# Patient Record
Sex: Female | Born: 1975 | Race: White | Hispanic: No | Marital: Married | Smoking: Former smoker
Health system: Southern US, Community
[De-identification: ages and names within clinical notes are randomized; demographics above are authoritative.]

## PROBLEM LIST (undated history)

## (undated) DIAGNOSIS — I8390 Asymptomatic varicose veins of unspecified lower extremity: Secondary | ICD-10-CM

## (undated) DIAGNOSIS — M797 Fibromyalgia: Secondary | ICD-10-CM

## (undated) DIAGNOSIS — N644 Mastodynia: Secondary | ICD-10-CM

## (undated) DIAGNOSIS — F319 Bipolar disorder, unspecified: Secondary | ICD-10-CM

## (undated) DIAGNOSIS — N893 Dysplasia of vagina, unspecified: Secondary | ICD-10-CM

## (undated) DIAGNOSIS — C55 Malignant neoplasm of uterus, part unspecified: Secondary | ICD-10-CM

## (undated) DIAGNOSIS — G4730 Sleep apnea, unspecified: Secondary | ICD-10-CM

## (undated) DIAGNOSIS — F329 Major depressive disorder, single episode, unspecified: Secondary | ICD-10-CM

## (undated) DIAGNOSIS — E785 Hyperlipidemia, unspecified: Secondary | ICD-10-CM

## (undated) DIAGNOSIS — Z8709 Personal history of other diseases of the respiratory system: Secondary | ICD-10-CM

## (undated) DIAGNOSIS — F909 Attention-deficit hyperactivity disorder, unspecified type: Secondary | ICD-10-CM

## (undated) DIAGNOSIS — L309 Dermatitis, unspecified: Secondary | ICD-10-CM

## (undated) DIAGNOSIS — R296 Repeated falls: Secondary | ICD-10-CM

## (undated) DIAGNOSIS — F419 Anxiety disorder, unspecified: Secondary | ICD-10-CM

## (undated) DIAGNOSIS — N930 Postcoital and contact bleeding: Secondary | ICD-10-CM

## (undated) DIAGNOSIS — R5383 Other fatigue: Secondary | ICD-10-CM

## (undated) DIAGNOSIS — F32A Depression, unspecified: Secondary | ICD-10-CM

## (undated) DIAGNOSIS — W19XXXA Unspecified fall, initial encounter: Secondary | ICD-10-CM

## (undated) DIAGNOSIS — R51 Headache: Secondary | ICD-10-CM

## (undated) DIAGNOSIS — E669 Obesity, unspecified: Secondary | ICD-10-CM

## (undated) DIAGNOSIS — Z87828 Personal history of other (healed) physical injury and trauma: Secondary | ICD-10-CM

## (undated) DIAGNOSIS — K219 Gastro-esophageal reflux disease without esophagitis: Secondary | ICD-10-CM

## (undated) DIAGNOSIS — R519 Headache, unspecified: Secondary | ICD-10-CM

## (undated) DIAGNOSIS — Z8739 Personal history of other diseases of the musculoskeletal system and connective tissue: Secondary | ICD-10-CM

## (undated) DIAGNOSIS — R7303 Prediabetes: Secondary | ICD-10-CM

## (undated) HISTORY — DX: Headache: R51

## (undated) HISTORY — DX: Obesity, unspecified: E66.9

## (undated) HISTORY — DX: Hyperlipidemia, unspecified: E78.5

## (undated) HISTORY — DX: Headache, unspecified: R51.9

## (undated) HISTORY — DX: Postcoital and contact bleeding: N93.0

## (undated) HISTORY — PX: LEEP: SHX91

## (undated) HISTORY — DX: Depression, unspecified: F32.A

## (undated) HISTORY — DX: Attention-deficit hyperactivity disorder, unspecified type: F90.9

## (undated) HISTORY — DX: Sleep apnea, unspecified: G47.30

## (undated) HISTORY — DX: Anxiety disorder, unspecified: F41.9

## (undated) HISTORY — PX: ABDOMINAL HYSTERECTOMY: SHX81

## (undated) HISTORY — DX: Major depressive disorder, single episode, unspecified: F32.9

## (undated) HISTORY — DX: Fibromyalgia: M79.7

## (undated) HISTORY — DX: Bipolar disorder, unspecified: F31.9

## (undated) HISTORY — PX: TONSILLECTOMY: SUR1361

## (undated) HISTORY — DX: Dermatitis, unspecified: L30.9

## (undated) HISTORY — DX: Other fatigue: R53.83

## (undated) HISTORY — DX: Mastodynia: N64.4

## (undated) HISTORY — PX: CARPAL TUNNEL RELEASE: SHX101

## (undated) HISTORY — PX: VARICOSE VEIN SURGERY: SHX832

---

## 2004-05-01 ENCOUNTER — Emergency Department: Payer: Self-pay | Admitting: Emergency Medicine

## 2004-07-04 ENCOUNTER — Emergency Department: Payer: Self-pay | Admitting: General Practice

## 2004-08-19 ENCOUNTER — Emergency Department: Payer: Self-pay | Admitting: Internal Medicine

## 2004-09-16 ENCOUNTER — Emergency Department: Payer: Self-pay | Admitting: General Practice

## 2004-12-27 ENCOUNTER — Ambulatory Visit: Payer: Self-pay | Admitting: Family Medicine

## 2005-01-12 ENCOUNTER — Emergency Department: Payer: Self-pay | Admitting: Emergency Medicine

## 2005-09-13 ENCOUNTER — Emergency Department: Payer: Self-pay | Admitting: Emergency Medicine

## 2006-01-02 ENCOUNTER — Emergency Department: Payer: Self-pay | Admitting: Emergency Medicine

## 2011-10-28 ENCOUNTER — Emergency Department: Payer: Self-pay | Admitting: Emergency Medicine

## 2013-05-24 ENCOUNTER — Ambulatory Visit: Payer: Self-pay | Admitting: Family Medicine

## 2013-07-23 ENCOUNTER — Ambulatory Visit: Payer: Self-pay | Admitting: Gynecologic Oncology

## 2013-08-04 ENCOUNTER — Ambulatory Visit: Payer: Self-pay | Admitting: Gynecologic Oncology

## 2013-08-14 ENCOUNTER — Ambulatory Visit: Payer: Self-pay | Admitting: Gynecologic Oncology

## 2013-08-14 LAB — BASIC METABOLIC PANEL
Anion Gap: 3 — ABNORMAL LOW (ref 7–16)
BUN: 10 mg/dL (ref 7–18)
Calcium, Total: 9.4 mg/dL (ref 8.5–10.1)
Chloride: 106 mmol/L (ref 98–107)
Co2: 27 mmol/L (ref 21–32)
Creatinine: 0.74 mg/dL (ref 0.60–1.30)
EGFR (African American): >60
EGFR (Non-African Amer.): >60
Glucose: 95 mg/dL (ref 65–99)
Osmolality: 271 (ref 275–301)
Potassium: 4.2 mmol/L (ref 3.5–5.1)
Sodium: 136 mmol/L (ref 136–145)

## 2013-08-14 LAB — CBC
HCT: 43.1 % (ref 35.0–47.0)
HGB: 14.7 g/dL (ref 12.0–16.0)
MCH: 31.6 pg (ref 26.0–34.0)
MCHC: 34 g/dL (ref 32.0–36.0)
MCV: 93 fL (ref 80–100)
PLATELETS: 230 10*3/uL (ref 150–440)
RBC: 4.64 10*6/uL (ref 3.80–5.20)
RDW: 14.4 % (ref 11.5–14.5)
WBC: 8.4 10*3/uL (ref 3.6–11.0)

## 2013-09-03 ENCOUNTER — Ambulatory Visit: Payer: Self-pay | Admitting: Gynecologic Oncology

## 2013-09-10 ENCOUNTER — Inpatient Hospital Stay: Payer: Self-pay | Admitting: Obstetrics and Gynecology

## 2013-09-11 LAB — HEMOGLOBIN: HGB: 12.5 g/dL (ref 12.0–16.0)

## 2013-09-12 LAB — PATHOLOGY REPORT

## 2013-10-02 ENCOUNTER — Ambulatory Visit: Payer: Self-pay | Admitting: Gynecologic Oncology

## 2013-11-01 ENCOUNTER — Ambulatory Visit: Payer: Self-pay | Admitting: Gynecologic Oncology

## 2014-01-24 ENCOUNTER — Ambulatory Visit: Payer: Self-pay

## 2014-02-14 ENCOUNTER — Ambulatory Visit: Payer: Self-pay | Admitting: Family Medicine

## 2014-03-05 ENCOUNTER — Ambulatory Visit: Payer: Self-pay | Admitting: Family Medicine

## 2014-03-07 ENCOUNTER — Emergency Department: Payer: Self-pay | Admitting: Emergency Medicine

## 2014-03-07 ENCOUNTER — Ambulatory Visit: Payer: Self-pay | Admitting: Family Medicine

## 2014-03-07 LAB — CBC WITH DIFFERENTIAL/PLATELET
Basophil #: 0.1 10*3/uL (ref 0.0–0.1)
Basophil %: 0.8 %
EOS ABS: 0.1 10*3/uL (ref 0.0–0.7)
Eosinophil %: 1.3 %
HCT: 39.1 % (ref 35.0–47.0)
HGB: 12.7 g/dL (ref 12.0–16.0)
Lymphocyte #: 1.2 10*3/uL (ref 1.0–3.6)
Lymphocyte %: 19.7 %
MCH: 30.1 pg (ref 26.0–34.0)
MCHC: 32.4 g/dL (ref 32.0–36.0)
MCV: 93 fL (ref 80–100)
MONOS PCT: 5.6 %
Monocyte #: 0.3 x10 3/mm (ref 0.2–0.9)
NEUTROS PCT: 72.6 %
Neutrophil #: 4.6 10*3/uL (ref 1.4–6.5)
PLATELETS: 209 10*3/uL (ref 150–440)
RBC: 4.2 10*6/uL (ref 3.80–5.20)
RDW: 15.4 % — ABNORMAL HIGH (ref 11.5–14.5)
WBC: 6.3 10*3/uL (ref 3.6–11.0)

## 2014-03-07 LAB — COMPREHENSIVE METABOLIC PANEL
ALK PHOS: 87 U/L
ALT: 58 U/L
ANION GAP: 6 — AB (ref 7–16)
AST: 33 U/L (ref 15–37)
Albumin: 3.4 g/dL (ref 3.4–5.0)
BUN: 20 mg/dL — AB (ref 7–18)
Bilirubin,Total: 0.2 mg/dL (ref 0.2–1.0)
CHLORIDE: 110 mmol/L — AB (ref 98–107)
Calcium, Total: 8.8 mg/dL (ref 8.5–10.1)
Co2: 25 mmol/L (ref 21–32)
Creatinine: 0.83 mg/dL (ref 0.60–1.30)
EGFR (African American): >60
GLUCOSE: 112 mg/dL — AB (ref 65–99)
Osmolality: 285 (ref 275–301)
POTASSIUM: 4.3 mmol/L (ref 3.5–5.1)
Sodium: 141 mmol/L (ref 136–145)
Total Protein: 6.8 g/dL (ref 6.4–8.2)

## 2014-03-07 LAB — SEDIMENTATION RATE: ERYTHROCYTE SED RATE: 19 mm/h (ref 0–20)

## 2014-03-07 LAB — PRO B NATRIURETIC PEPTIDE: B-Type Natriuretic Peptide: 23 pg/mL (ref 0–125)

## 2014-04-29 ENCOUNTER — Ambulatory Visit: Payer: Self-pay | Admitting: Obstetrics and Gynecology

## 2014-05-04 ENCOUNTER — Ambulatory Visit: Payer: Self-pay | Admitting: Obstetrics and Gynecology

## 2014-05-17 ENCOUNTER — Ambulatory Visit: Payer: Self-pay | Admitting: Neurology

## 2014-06-10 ENCOUNTER — Ambulatory Visit: Payer: Self-pay | Admitting: Neurology

## 2014-06-11 ENCOUNTER — Ambulatory Visit: Payer: Self-pay | Admitting: Neurology

## 2014-07-20 ENCOUNTER — Emergency Department: Payer: Self-pay | Admitting: Physician Assistant

## 2014-09-12 DIAGNOSIS — G47419 Narcolepsy without cataplexy: Secondary | ICD-10-CM | POA: Insufficient documentation

## 2014-09-12 DIAGNOSIS — G43119 Migraine with aura, intractable, without status migrainosus: Secondary | ICD-10-CM | POA: Insufficient documentation

## 2014-09-16 ENCOUNTER — Other Ambulatory Visit: Payer: Self-pay | Admitting: Specialist

## 2014-09-23 ENCOUNTER — Ambulatory Visit: Payer: Self-pay | Admitting: Specialist

## 2014-10-25 NOTE — Op Note (Signed)
PATIENT NAME:  Dana Whitaker MR#:  [id number] DATE OF BIRTH:  Nov 22, 1975  DATE OF PROCEDURE:  09/10/2013  PREOPERATIVE DIAGNOSIS: Adenocarcinoma of the endometrium.   POSTOPERATIVE DIAGNOSIS: Adenocarcinoma of the endometrium.   PROCEDURE PERFORMED: Laparoscopy with robotic assistance for total laparoscopic hysterectomy with bilateral salpingo-oophorectomy.   SURGEON: Weber Cooks, MD   ANESTHESIA: General.   COMPLICATIONS: None.   ESTIMATED BLOOD LOSS: 25 mL.  INDICATION FOR SURGERY: [name] is a 39 year old patient who presented with a well-differentiated endometrioid adenocarcinoma. Treatment options were discussed with her, and the decision was made to proceed with a hysterectomy, BSO, and staging biopsies if necessary.   FINDINGS AT TIME OF SURGERY:  1.  Uterus slightly enlarged.  2.  Adnexa normal bilaterally.  3.  Pelvic peritoneum within normal limits.  4.  Upper abdomen within normal limits.  5.  Frozen section: No definite evidence of invasion.   OPERATIVE REPORT: After adequate general anesthesia had been obtained, the patient was prepped and draped in ski position. Then, a speculum was inserted into the vagina. The cervix was visualized, grasped with a single-tooth tenaculum and sounded. Then, a holding stitch was placed. Then, the VCare was inserted into the uterus and around the cervix. A Foley catheter was inserted.   Then, the attention was directed to the abdomen. A 1 cm incision was placed above the umbilicus. The fascia was identified, grasped and transected. The peritoneum was identified and entered bluntly. This was followed by the blunt trocar. Pneumoperitoneum was obtained, and inspection was done with the above-mentioned findings. Under direct vision, 2 robotic trocars were inserted into the mid abdomen and assistant port in the left lower quadrant and a VersaStep assistant port in the right lower quadrant. The patient was placed into steeper  Trendelenburg position. The bowel was pushed away from the pelvis. She was then attached to the robot.   Cytology was obtained. Adhesions between sigmoid and pelvic sidewall were lysed. Then, the round ligament on the left side was cauterized and transected. The pelvic sidewall was entered. Vessels and ureter were identified. The infundibulopelvic ligament was cauterized and transected. The adnexa were mobilized towards the uterus. The same procedure was performed on the contralateral side. Then, the anterior fold of the peritoneum was incised and the bladder was freed from lower uterine segment, cervix, and upper vagina. The posterior peritoneum was incised and the rectum was pushed away from the [facility]. Then, the uterine vessels were identified on either side, cauterized and transected. Finally, the VCare at the vaginal fornix was freed all around. The vagina was entered anteriorly and incision was carried all the way around until uterus, tubes, and ovaries were completely mobilized and could be removed through the vagina without problems. The vagina was closed with a figure-of-eight stitch using 0 Vicryl on the left side and a #1 running V-Loc suture starting on the right and continuing all the way to the left, and then to back stitches back to the right. Inspection of the pelvis was done and hemostasis was adequate.   Frozen section became available and failed to reveal any evidence of myometrial invasion. Therefore, no further procedures were deemed necessary. The patient was detached from the robot.   All ports were removed under direct vision without evidence of bleeding. Finally, the camera port was removed. The fascia was reapproximated with 2 figure-of-eight stitches using 0 Vicryl. The skin was reapproximated with 3-0 Vicryl and closed with Dermabond.   The patient tolerated the procedure well and  was taken to recovery room in stable condition. The postoperative urine was clear. Pad, sponge,  needle, and instrument counts were correct x 2.   ____________________________ Weber Cooks, MD bem:jcm D: 09/10/2013 16:10:09 ET T: 09/10/2013 17:26:52 ET JOB#: [id number]  cc: Weber Cooks, MD, <Dictator> Weber Cooks MD ELECTRONICALLY SIGNED 09/17/2013 11:36

## 2014-11-06 LAB — HM PAP SMEAR: HM Pap smear: NEGATIVE

## 2015-01-13 ENCOUNTER — Other Ambulatory Visit: Payer: Self-pay

## 2015-01-13 ENCOUNTER — Ambulatory Visit: Payer: BLUE CROSS/BLUE SHIELD | Admitting: Psychiatry

## 2015-01-13 DIAGNOSIS — Z87898 Personal history of other specified conditions: Secondary | ICD-10-CM | POA: Insufficient documentation

## 2015-01-13 DIAGNOSIS — G4730 Sleep apnea, unspecified: Secondary | ICD-10-CM | POA: Insufficient documentation

## 2015-01-13 DIAGNOSIS — F331 Major depressive disorder, recurrent, moderate: Secondary | ICD-10-CM | POA: Insufficient documentation

## 2015-01-13 DIAGNOSIS — F411 Generalized anxiety disorder: Secondary | ICD-10-CM | POA: Insufficient documentation

## 2015-01-13 DIAGNOSIS — F603 Borderline personality disorder: Secondary | ICD-10-CM | POA: Insufficient documentation

## 2015-01-13 DIAGNOSIS — Z8639 Personal history of other endocrine, nutritional and metabolic disease: Secondary | ICD-10-CM | POA: Insufficient documentation

## 2015-01-13 DIAGNOSIS — M797 Fibromyalgia: Secondary | ICD-10-CM | POA: Insufficient documentation

## 2015-01-13 DIAGNOSIS — G4700 Insomnia, unspecified: Secondary | ICD-10-CM | POA: Insufficient documentation

## 2015-01-13 DIAGNOSIS — F902 Attention-deficit hyperactivity disorder, combined type: Secondary | ICD-10-CM | POA: Insufficient documentation

## 2015-02-03 ENCOUNTER — Telehealth: Payer: Self-pay | Admitting: Psychiatry

## 2015-02-04 NOTE — Telephone Encounter (Signed)
no answer no message could be left.  pt letter was ready for pickup.

## 2015-02-05 NOTE — Telephone Encounter (Signed)
no answer, pt has an appt friday 02-06-15. will leave letter up front for when pt come in.

## 2015-02-06 ENCOUNTER — Ambulatory Visit (INDEPENDENT_AMBULATORY_CARE_PROVIDER_SITE_OTHER): Payer: BLUE CROSS/BLUE SHIELD | Admitting: Psychiatry

## 2015-02-06 ENCOUNTER — Encounter: Payer: Self-pay | Admitting: Psychiatry

## 2015-02-06 VITALS — BP 124/88 | HR 69 | Temp 97.4°F | Ht 64.0 in | Wt 283.8 lb

## 2015-02-06 DIAGNOSIS — F411 Generalized anxiety disorder: Secondary | ICD-10-CM | POA: Diagnosis not present

## 2015-02-06 DIAGNOSIS — F331 Major depressive disorder, recurrent, moderate: Secondary | ICD-10-CM | POA: Diagnosis not present

## 2015-02-06 MED ORDER — CLONAZEPAM 0.5 MG PO TABS: 1.5000 mg | ORAL_TABLET | Freq: Every day | ORAL | Status: DC

## 2015-02-06 MED ORDER — VENLAFAXINE HCL ER 150 MG PO CP24: 150.0000 mg | ORAL_CAPSULE | Freq: Every day | ORAL | Status: DC

## 2015-02-06 NOTE — Progress Notes (Signed)
BH MD/PA/NP OP Progress Note  02/06/2015 9:11 AM Dana Whitaker  MRN:  [id number]  Subjective:  Patient returns for follow-up of her generalized anxiety disorder and major depressive disorder, recurrent moderate. She is overall things are going well. She states her mood is been stable. 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 She states she is now on unemployment and looking for work. She states that she is trying to deal with bariatric surgery. I provided her with a letter stating she is psychiatrically clear for the procedure.  She states she looks forward to the bariatric surgery. She relates it somewhat stressful looking for a new job as she has never had to in the past. Chief Complaint:  stressed Visit Diagnosis:  No diagnosis found.  Past Medical History: No past medical history on file. No past surgical history on file. Family History: No family history on file. Social History:  History   Social History  . Marital Status: Married    Spouse Name: N/A  . Number of Children: N/A  . Years of Education: N/A   Social History Main Topics  . Smoking status: Not on file  . Smokeless tobacco: Not on file  . Alcohol Use: Not on file  . Drug Use: Not on file  . Sexual Activity: Not on file   Other Topics Concern  . Not on file   Social History Narrative  . No narrative on file   Additional History:   Assessment:   Musculoskeletal: Strength & Muscle Tone: within normal limits Gait & Station: normal Patient leans: N/A  Psychiatric Specialty Exam: HPI  Review of Systems  Psychiatric/Behavioral: Negative for depression, suicidal ideas, hallucinations, memory loss and substance abuse. The patient is not nervous/anxious and does not have insomnia.     There were no vitals taken for this visit.There is no height or weight on file to calculate BMI.  General Appearance: Well Groomed  Eye Contact:  Good  Speech:  Normal Rate  Volume:  Normal   Mood:  Stressed  Affect:  Appropriate  Thought Process:  Linear and Logical  Orientation:  Full (Time, Place, and Person)  Thought Content:  Negative  Suicidal Thoughts:  No  Homicidal Thoughts:  No  Memory:  Immediate;   Good Recent;   Good Remote;   Good  Judgement:  Good  Insight:  Good  Psychomotor Activity:  Negative  Concentration:  Good  Recall:  Good  Fund of Knowledge: Good  Language: Good  Akathisia:  Negative  Handed:  Right unknown   AIMS (if indicated):  N/A  Assets:  Communication Skills Desire for Improvement  ADL's:  Intact  Cognition: WNL  Sleep:  good   Is the patient at risk to self?  No. Has the patient been a risk to self in the past 6 months?  No. Has the patient been a risk to self within the distant past?  No. Is the patient a risk to others?  No. Has the patient been a risk to others in the past 6 months?  No. Has the patient been a risk to others within the distant past?  No.  Current Medications: Current Outpatient Prescriptions  Medication Sig Dispense Refill  . clonazePAM (KLONOPIN) 0.5 MG tablet Take 3 tablets (1.5 mg total) by mouth at bedtime. 90 tablet 3  . Diethylpropion HCl CR 75 MG TB24 Take by mouth.    . estradiol (ESTRACE) 1 MG tablet Take by mouth.    Marland Kitchen  naproxen sodium (ANAPROX) 550 MG tablet Take by mouth.    . SUMAtriptan (IMITREX) 50 MG tablet Take by mouth.    . Venlafaxine HCl 150 MG TB24 Take by mouth.     No current facility-administered medications for this visit.    Medical Decision Making:  Established Problem, Stable/Improving (1) and Review of Medication Regimen & Side Effects (2)  Treatment Plan Summary:Medication management and Plan Patient has been stable on this medication regimen. She will continue on her Effexor XR 150 mg daily and are clonazepam 1.5 mg at bedtime. She'll follow-up in 3 months. She's been encouraged call with any questions or concerns prior to her next point.   Faith Rogue 02/06/2015,  9:11 AM

## 2015-04-08 ENCOUNTER — Encounter: Payer: Self-pay | Admitting: Emergency Medicine

## 2015-04-08 ENCOUNTER — Emergency Department: Payer: 59

## 2015-04-08 ENCOUNTER — Emergency Department
Admission: EM | Admit: 2015-04-08 | Discharge: 2015-04-08 | Disposition: A | Discharge status: Home / Self Care | Payer: 59 | Attending: Emergency Medicine | Admitting: Emergency Medicine

## 2015-04-08 DIAGNOSIS — Z79899 Other long term (current) drug therapy: Secondary | ICD-10-CM | POA: Diagnosis not present

## 2015-04-08 DIAGNOSIS — Z720 Tobacco use: Secondary | ICD-10-CM | POA: Diagnosis not present

## 2015-04-08 DIAGNOSIS — Y9389 Activity, other specified: Secondary | ICD-10-CM | POA: Insufficient documentation

## 2015-04-08 DIAGNOSIS — X58XXXA Exposure to other specified factors, initial encounter: Secondary | ICD-10-CM | POA: Insufficient documentation

## 2015-04-08 DIAGNOSIS — Y998 Other external cause status: Secondary | ICD-10-CM | POA: Insufficient documentation

## 2015-04-08 DIAGNOSIS — S99912A Unspecified injury of left ankle, initial encounter: Secondary | ICD-10-CM | POA: Diagnosis present

## 2015-04-08 DIAGNOSIS — S92102A Unspecified fracture of left talus, initial encounter for closed fracture: Secondary | ICD-10-CM

## 2015-04-08 DIAGNOSIS — Y9289 Other specified places as the place of occurrence of the external cause: Secondary | ICD-10-CM | POA: Insufficient documentation

## 2015-04-08 DIAGNOSIS — S92192A Other fracture of left talus, initial encounter for closed fracture: Secondary | ICD-10-CM | POA: Insufficient documentation

## 2015-04-08 DIAGNOSIS — E119 Type 2 diabetes mellitus without complications: Secondary | ICD-10-CM | POA: Diagnosis not present

## 2015-04-08 HISTORY — DX: Malignant neoplasm of uterus, part unspecified: C55

## 2015-04-08 MED ORDER — TRAMADOL HCL 50 MG PO TABS: 50.0000 mg | ORAL_TABLET | Freq: Four times a day (QID) | ORAL | Status: DC | PRN

## 2015-04-08 MED ORDER — MELOXICAM 15 MG PO TABS: 15.0000 mg | ORAL_TABLET | Freq: Every day | ORAL | Status: DC

## 2015-04-08 NOTE — ED Provider Notes (Signed)
[facility] Emergency Department Provider Note ____________________________________________  Time seen: Approximately 12:31 PM  I have reviewed the triage vital signs and the nursing notes.   HISTORY  Chief Complaint Ankle Injury and Fall   HPI Dana Whitaker is a 39 y.o. female who presents to the emergency department for evaluation of left ankle pain. She stepped off of a curb and somehow twisted her left ankle. She has not had any previous injuries to that ankle.   Past Medical History  Diagnosis Date  . ADHD (attention deficit hyperactivity disorder)   . Anxiety   . Depression   . Bipolar disorder ([place])   . Diabetes mellitus, type II ([place])   . Fatigue   . Headache   . Uterine cancer [facility])     Patient Active Problem List   Diagnosis Date Noted  . Aggrieved 01/13/2015  . Borderline personality disorder 01/13/2015  . ADHD (attention deficit hyperactivity disorder), combined type 01/13/2015  . Clinical depression 01/13/2015  . H/O: obesity 01/13/2015  . H/O disease 01/13/2015  . Apnea, sleep 01/13/2015  . Anxiety, generalized 01/13/2015  . Insomnia, persistent 01/13/2015  . Depression, major, recurrent, moderate ([place]) 01/13/2015  . Fibromyalgia 01/13/2015  . Classical migraine with intractable migraine 09/12/2014  . Extreme obesity ([place]) 09/12/2014  . 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 Abdominal hysterectomy    . Tonsillectomy      Current Outpatient Rx  Name  Route  Sig  Dispense  Refill  . clonazePAM (KLONOPIN) 0.5 MG tablet   Oral   Take 3 tablets (1.5 mg total) by mouth at bedtime.   90 tablet   3   . Diethylpropion HCl CR 75 MG TB24   Oral   Take by mouth.         . estradiol (ESTRACE) 1 MG tablet   Oral   Take by mouth.         . meloxicam (MOBIC) 15 MG tablet   Oral   Take 1 tablet (15 mg total) by mouth daily.   30 tablet   2   . modafinil  (PROVIGIL) 200 MG tablet   Oral   Take 200 mg by mouth daily.         . naproxen sodium (ANAPROX) 550 MG tablet   Oral   Take by mouth.         . SUMAtriptan (IMITREX) 50 MG tablet   Oral   Take by mouth.         . traMADol (ULTRAM) 50 MG tablet   Oral   Take 1 tablet (50 mg total) by mouth every 6 (six) hours as needed.   9 tablet   0   . Venlafaxine HCl 150 MG TB24   Oral   Take by mouth.         . venlafaxine XR (EFFEXOR-XR) 150 MG 24 hr capsule   Oral   Take 1 capsule (150 mg total) by mouth daily.   30 capsule   3     Allergies Ciprofloxacin  Family History  Problem Relation Age of Onset  . Hyperlipidemia Mother   . Bipolar disorder Mother   . Heart attack Father   . Drug abuse Father   . Anxiety disorder Sister   . Depression Sister   . Thyroid disease Maternal Grandmother   . Anxiety disorder Sister   . Depression Sister   . ADD / ADHD Sister   .  Alcohol abuse Sister     Social History Social History  Substance Use Topics  . Smoking status: Current Every Day Smoker -- 1.00 packs/day    Types: Cigarettes    Start date: 02/05/1994  . Smokeless tobacco: Never Used  . Alcohol Use: No    Review of Systems Constitutional: No recent illness. Eyes: No visual changes. ENT: No sore throat. Cardiovascular: Denies chest pain or palpitations. Respiratory: Denies shortness of breath. Gastrointestinal: No abdominal pain.  Genitourinary: Negative for dysuria. Musculoskeletal: Pain in left ankle and foot. Skin: Negative for rash. Neurological: Negative for headaches, focal weakness or numbness. 10-point ROS otherwise negative.  ____________________________________________   PHYSICAL EXAM:  VITAL SIGNS: ED Triage Vitals  Enc Vitals Group     BP 04/08/15 1210 124/81 mmHg     Pulse Rate 04/08/15 1210 68     Resp 04/08/15 1210 18     Temp 04/08/15 1210 98.3 F (36.8 C)     Temp Source 04/08/15 1210 Oral     SpO2 04/08/15 1210 99 %      Weight 04/08/15 1210 280 lb (127.007 kg)     Height 04/08/15 1210 5\' 4"  (1.626 m)     Head Cir --      Peak Flow --      Pain Score 04/08/15 1211 8     Pain Loc --      Pain Edu? --      Excl. in [place]? --     Constitutional: Alert and oriented. Well appearing and in no acute distress. Eyes: Conjunctivae are normal. EOMI. Head: Atraumatic. Nose: No congestion/rhinnorhea. Neck: No stridor.  Respiratory: Normal respiratory effort.   Musculoskeletal: Tenderness to pressure in the syndesmotic area and on the lateral malleolus area with swelling. Neurologic:  Normal speech and language. No gross focal neurologic deficits are appreciated. Speech is normal. Skin:  Skin is warm, dry and intact. Atraumatic. Psychiatric: Mood and affect are normal. Speech and behavior are normal.  ____________________________________________   LABS (all labs ordered are listed, but only abnormal results are displayed)  Labs Reviewed - No data to display ____________________________________________  RADIOLOGY  Avulsion fracture of the talus of the left foot.  I, Sherrie George, personally viewed and evaluated these images (plain radiographs) as part of my medical decision making.   ____________________________________________   PROCEDURES  Procedure(s) performed:   SPLINT APPLICATION Date/Time: 5:57 PM Authorized by: Sherrie George Consent: Verbal consent obtained. Risks and benefits: risks, benefits and alternatives were discussed Consent given by: patient Splint applied by: ER technician Location details: left foot/ankle Splint type: OCL posterior Supplies used: OCL and ACE Post-procedure: The splinted body part was neurovascularly unchanged following the procedure. Patient tolerance: Patient tolerated the procedure well with no immediate complications.      ____________________________________________   INITIAL IMPRESSION / ASSESSMENT AND PLAN / ED COURSE  Pertinent labs & imaging  results that were available during my care of the patient were reviewed by me and considered in my medical decision making (see chart for details).  Patient was advised to follow up with the orthopedic doctor. She is to call today for an appointment. She was advised to stay non weight bearing until cleared by orthopedics. She was advised to return to the ER for symptoms that change or worsen if unable to schedule an appointment. ____________________________________________   FINAL CLINICAL IMPRESSION(S) / ED DIAGNOSES  Final diagnoses:  Talar fracture, left, closed, initial encounter       Victorino Dike, FNP  04/08/15 [place], MD 04/08/15 [phone]

## 2015-04-08 NOTE — ED Notes (Signed)
Pt presents via EMS. Per EMS VSS and WNL. Pt states she stepped on curb wrong. C/o pain to her L ankle, swelling noted at this time. Pt denies hitting head or LOC at this time.

## 2015-04-08 NOTE — ED Notes (Signed)
AAOx3.  Skin warm and dry.   

## 2015-04-08 NOTE — ED Notes (Signed)
AAOx3.  Skin warm and dry.  NAD 

## 2015-04-08 NOTE — Discharge Instructions (Signed)
°Cast or Splint Care  ° ° °Casts and splints support injured limbs and keep bones from moving while they heal. It is important to care for your cast or splint at home.  °HOME CARE INSTRUCTIONS  °Keep the cast or splint uncovered during the drying period. It can take 24 to 48 hours to dry if it is made of plaster. A fiberglass cast will dry in less than 1 hour.  °Do not rest the cast on anything harder than a pillow for the first 24 hours.  °Do not put weight on your injured limb or apply pressure to the cast until your health care provider gives you permission.  °Keep the cast or splint dry. Wet casts or splints can lose their shape and may not support the limb as well. A wet cast that has lost its shape can also create harmful pressure on your skin when it dries. Also, wet skin can become infected.  °Cover the cast or splint with a plastic bag when bathing or when out in the rain or snow. If the cast is on the trunk of the body, take sponge baths until the cast is removed.  °If your cast does become wet, dry it with a towel or a blow dryer on the cool setting only. °Keep your cast or splint clean. Soiled casts may be wiped with a moistened cloth.  °Do not place any hard or soft foreign objects under your cast or splint, such as cotton, toilet paper, lotion, or powder.  °Do not try to scratch the skin under the cast with any object. The object could get stuck inside the cast. Also, scratching could lead to an infection. If itching is a problem, use a blow dryer on a cool setting to relieve discomfort.  °Do not trim or cut your cast or remove padding from inside of it.  °Exercise all joints next to the injury that are not immobilized by the cast or splint. For example, if you have a long leg cast, exercise the hip joint and toes. If you have an arm cast or splint, exercise the shoulder, elbow, thumb, and fingers.  °Elevate your injured arm or leg on 1 or 2 pillows for the first 1 to 3 days to decrease swelling and  pain. It is best if you can comfortably elevate your cast so it is higher than your heart. °SEEK MEDICAL CARE IF:  °Your cast or splint cracks.  °Your cast or splint is too tight or too loose.  °You have unbearable itching inside the cast.  °Your cast becomes wet or develops a soft spot or area.  °You have a bad smell coming from inside your cast.  °You get an object stuck under your cast.  °Your skin around the cast becomes red or raw.  °You have new pain or worsening pain after the cast has been applied. °SEEK IMMEDIATE MEDICAL CARE IF:  °You have fluid leaking through the cast.  °You are unable to move your fingers or toes.  °You have discolored (blue or white), cool, painful, or very swollen fingers or toes beyond the cast.  °You have tingling or numbness around the injured area.  °You have severe pain or pressure under the cast.  °You have any difficulty with your breathing or have shortness of breath.  °You have chest pain. °This information is not intended to replace advice given to you by your health care provider. Make sure you discuss any questions you have with your health care provider.  °  Document Released: 06/17/2000 Document Revised: 04/10/2013 Document Reviewed: 12/27/2012  °Elsevier Interactive Patient Education ©2016 [employer]  ° °

## 2015-04-29 ENCOUNTER — Ambulatory Visit: Payer: Self-pay

## 2015-05-08 ENCOUNTER — Ambulatory Visit: Payer: BLUE CROSS/BLUE SHIELD | Admitting: Psychiatry

## 2015-05-13 ENCOUNTER — Ambulatory Visit: Payer: Self-pay

## 2015-05-27 ENCOUNTER — Ambulatory Visit: Payer: Self-pay

## 2015-06-10 ENCOUNTER — Encounter: Payer: Self-pay | Admitting: Obstetrics and Gynecology

## 2015-06-10 ENCOUNTER — Inpatient Hospital Stay: Payer: 59 | Attending: Obstetrics and Gynecology | Admitting: Obstetrics and Gynecology

## 2015-06-10 VITALS — BP 123/71 | HR 74 | Temp 96.7°F | Wt 282.9 lb

## 2015-06-10 DIAGNOSIS — Z6841 Body Mass Index (BMI) 40.0 and over, adult: Secondary | ICD-10-CM | POA: Insufficient documentation

## 2015-06-10 DIAGNOSIS — Z90710 Acquired absence of both cervix and uterus: Secondary | ICD-10-CM | POA: Diagnosis not present

## 2015-06-10 DIAGNOSIS — Z90722 Acquired absence of ovaries, bilateral: Secondary | ICD-10-CM | POA: Insufficient documentation

## 2015-06-10 DIAGNOSIS — F603 Borderline personality disorder: Secondary | ICD-10-CM | POA: Insufficient documentation

## 2015-06-10 DIAGNOSIS — A630 Anogenital (venereal) warts: Secondary | ICD-10-CM | POA: Diagnosis not present

## 2015-06-10 DIAGNOSIS — Z79899 Other long term (current) drug therapy: Secondary | ICD-10-CM | POA: Diagnosis not present

## 2015-06-10 DIAGNOSIS — M3320 Polymyositis, organ involvement unspecified: Secondary | ICD-10-CM | POA: Diagnosis not present

## 2015-06-10 DIAGNOSIS — M797 Fibromyalgia: Secondary | ICD-10-CM | POA: Diagnosis not present

## 2015-06-10 DIAGNOSIS — Z8542 Personal history of malignant neoplasm of other parts of uterus: Secondary | ICD-10-CM

## 2015-06-10 DIAGNOSIS — E669 Obesity, unspecified: Secondary | ICD-10-CM | POA: Diagnosis not present

## 2015-06-10 DIAGNOSIS — Z791 Long term (current) use of non-steroidal anti-inflammatories (NSAID): Secondary | ICD-10-CM | POA: Insufficient documentation

## 2015-06-10 DIAGNOSIS — E119 Type 2 diabetes mellitus without complications: Secondary | ICD-10-CM | POA: Insufficient documentation

## 2015-06-10 DIAGNOSIS — F331 Major depressive disorder, recurrent, moderate: Secondary | ICD-10-CM | POA: Diagnosis not present

## 2015-06-10 NOTE — Progress Notes (Signed)
Assisted MD with pelvic exam

## 2015-06-10 NOTE — Patient Instructions (Signed)

## 2015-06-10 NOTE — Progress Notes (Signed)
Gynecologic Oncology Consult Visit   Referring Provider: Dr. Enzo Bi  Chief Concern: Surveillance for stage IA, type I endometrioid endometrial cancer.  Subjective:  Dana Whitaker is a 39 y.o. female who is seen in consultation from Dr. Enzo Bi for endometrial cancer. She is doing well. She saw Dr. Enzo Bi earlier this year and had a negative exam per her report. She was able to lost 30 pounds but then gained back 50. She is trying to schedule gastric bypass surgery.   She has several complaints, but most are chronic in nature - fatigue, SOB and cough (due to smoking), back pain, leg swelling, hand/finger numbness and tingling due to carpal tunnel syndrome. She also complains of a several month history of N/V associated with RUQ pain and diarrhea after eating. The symptoms are worse after eating fatty or spicy foods. She has not been evaluated for these symptoms yet.   Gynecologic Oncology History [name] is a pleasant patient with Stage IA, grade 1, type I endometrioid endometrial cancer.   07/2013     EMB reveals a well differentiated endometrioid adenocarcinoma 09/10/2013 TLHBSO for grade 1 endometrioid endometrial cancer. Tumor size 0.4 cm, no myometrial invasion. LVSI not identified.   Problem List: Patient Active Problem List   Diagnosis Date Noted  . Aggrieved 01/13/2015  . Borderline personality disorder 01/13/2015  . ADHD (attention deficit hyperactivity disorder), combined type 01/13/2015  . Clinical depression 01/13/2015  . H/O: obesity 01/13/2015  . H/O disease 01/13/2015  . Apnea, sleep 01/13/2015  . Anxiety, generalized 01/13/2015  . Insomnia, persistent 01/13/2015  . Depression, major, recurrent, moderate ([place]) 01/13/2015  . Fibromyalgia 01/13/2015  . Adjustment disorder with depressed mood 01/13/2015  . ADD (attention deficit disorder) 01/13/2015  . Moderate episode of recurrent major depressive disorder ([place]) 01/13/2015  . Major depressive disorder  with single episode ([place]) 01/13/2015  . Polymyositis ([place]) 01/13/2015  . Classical migraine with intractable migraine 09/12/2014  . Extreme obesity ([place]) 09/12/2014  . 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 Morbid obesity ([place]) 09/12/2014    Past Medical History: Past Medical History  Diagnosis Date  . ADHD (attention deficit hyperactivity disorder)   . Anxiety   . Depression   . Bipolar disorder ([place])   . Diabetes mellitus, type II ([place])   . Fatigue   . Headache   . Uterine cancer [facility])     Past Surgical History: Past Surgical History  Procedure Laterality Date  . Abdominal hysterectomy    . Tonsillectomy      Past Gynecologic History:  Menarche: 14 History of Abnormal pap: HPV Infection and h/o cervical dysplasia treated wioth a LEEP in 2010, no problems since      OB History: G1P1  Family History: Family History  Problem Relation Age of Onset  . Hyperlipidemia Mother   . Bipolar disorder Mother   . Heart attack Father   . Drug abuse Father   . Anxiety disorder Sister   . Depression Sister   . Thyroid disease Maternal Grandmother   . Anxiety disorder Sister   . Depression Sister   . ADD / ADHD Sister   . Alcohol abuse Sister     Social History: Social History   Social History  . Marital Status: Married    Spouse Name: N/A  . Number of Children: N/A  . Years of Education: N/A   Occupational History  . Not on file.   Social History Main Topics  . Smoking status: Current Every Day Smoker -- 1.00 packs/day  Types: Cigarettes    Start date: 02/05/1994  . Smokeless tobacco: Never Used  . Alcohol Use: No  . Drug Use: No  . Sexual Activity: Yes    Birth Control/ Protection: None   Other Topics Concern  . Not on file   Social History Narrative    Allergies: Allergies  Allergen Reactions  . Ciprofloxacin   . Tramadol Rash    Current Medications: Current Outpatient Prescriptions  Medication Sig Dispense Refill  . clonazePAM  (KLONOPIN) 0.5 MG tablet Take 3 tablets (1.5 mg total) by mouth at bedtime. 90 tablet 3  . Diethylpropion HCl CR 75 MG TB24 Take by mouth.    . estradiol (ESTRACE) 1 MG tablet Take by mouth.    . meloxicam (MOBIC) 15 MG tablet Take 1 tablet (15 mg total) by mouth daily. 30 tablet 2  . modafinil (PROVIGIL) 200 MG tablet Take 200 mg by mouth daily.    . naproxen sodium (ANAPROX) 550 MG tablet Take by mouth.    . SUMAtriptan (IMITREX) 50 MG tablet Take by mouth.    . Venlafaxine HCl 150 MG TB24 Take by mouth.    . venlafaxine XR (EFFEXOR-XR) 150 MG 24 hr capsule Take 1 capsule (150 mg total) by mouth daily. 30 capsule 3   No current facility-administered medications for this visit.    Review of Systems General: fatigue  HEENT: no complaints  Lungs: SOB and cough  Cardiac: no complaints  GI: as per HPI  GU: no complaints  Musculoskeletal: no complaints  Extremities: no complaints  Skin: no complaints  Neuro: numbness/tingling hand and feet, seen by neurology  Endocrine: no complaints  Psych: no complaints       Objective:  Physical Examination:  BP 123/71 mmHg  Pulse 74  Temp(Src) 96.7 F (35.9 C) (Tympanic)  Wt 282 lb 13.6 oz (128.3 kg)  LMP 08/04/2013  Body mass index is 48.53 kg/(m^2).    ECOG Performance Status: 1 - Symptomatic but completely ambulatory  General appearance: alert, cooperative and appears stated age HEENT:PERRLA, extra ocular movement intact and sclera clear, anicteric Lymph node survey: non-palpable, axillary, inguinal, supraclavicular Cardiovascular: regular rate and rhythm Respiratory: normal air entry, lungs clear to auscultation Abdomen: soft, non-tender, without masses or organomegaly, no hernias and well healed incision Extremities: extremities normal, atraumatic, no cyanosis or edema and varicose veins noted Neurological exam reveals alert, oriented, normal speech, no focal findings or movement disorder noted.  Pelvic: exam chaperoned by  nurse;  Vulva: normal appearing vulva with no masses, tenderness or lesions; Vagina: normal vagina; Adnexa: no masses surgically absent bilateral; Uterus/Cervix: surgically absent, vaginal cuff well healed      Assessment:  Dana Whitaker is a 39 y.o. female diagnosed with Stage 1, grade 1, type I endometroid adenocarcinoma, NED Obesity, actively seeking gastric bypass surgery.  Vasomotor symptoms controlled with ERT Bilateral leg swelling unlikely secondary to surgical lymphedema as no LND was performed s/p varicose vein surgery and injection 2016. GI symptoms concerning cholelithiasis.  Tobacco usage.   Plan:   Problem List Items Addressed This Visit    None    Visit Diagnoses    History of endometrial cancer    -  Primary    Obesity             Continue to alternate surveillance with Dr. Enzo Bi every 6 months. She will RTC in one year with [place]. Her recurrence risk is 1-2%.  We continued to encourage weight loss and continued exercise.  We recommended that she follow up with her PCP regarding evaluation of GI symptoms and assessment for cholelithiasis as well as tobacco cessation. Until her GI assessment is complete I have recommended she stop ERT. But she has horrible migraine symptoms without the ERT and she would like to continue this therapy even though the known risk of gallbladder disease with this medication.   Gillis Ends, MD    CC:  Dr. Enzo Bi

## 2015-06-18 ENCOUNTER — Encounter: Payer: Self-pay | Admitting: Family Medicine

## 2015-06-18 ENCOUNTER — Ambulatory Visit (INDEPENDENT_AMBULATORY_CARE_PROVIDER_SITE_OTHER): Payer: 59 | Admitting: Family Medicine

## 2015-06-18 VITALS — BP 122/77 | HR 88 | Temp 97.7°F | Resp 17 | Ht 64.0 in | Wt 286.4 lb

## 2015-06-18 DIAGNOSIS — R1011 Right upper quadrant pain: Secondary | ICD-10-CM

## 2015-06-18 DIAGNOSIS — R12 Heartburn: Secondary | ICD-10-CM

## 2015-06-18 MED ORDER — OMEPRAZOLE 20 MG PO CPDR: 20.0000 mg | DELAYED_RELEASE_CAPSULE | Freq: Every day | ORAL | Status: DC

## 2015-06-18 NOTE — Progress Notes (Signed)
Name: Dana Whitaker   MRN: [id number]    DOB: 1976-04-16   Date:06/18/2015       Progress Note  Subjective  Chief Complaint  Chief Complaint  Patient presents with  . Referral    Abdominal Pain This is a new problem. The current episode started more than 1 month ago. The pain is located in the RUQ. The pain is at a severity of 8/10. The quality of the pain is sharp. Associated symptoms include diarrhea (loose stool, but not always diarrhea). Pertinent negatives include no anorexia, constipation, dysuria, fever, flatus, frequency, melena, nausea, vomiting or weight loss. The pain is aggravated by eating (especially eating fried or fatty foods). She has tried nothing for the symptoms.  Pt.'s oncologist recommended that she should be checked for gallstones.  Past Medical History  Diagnosis Date  . ADHD (attention deficit hyperactivity disorder)   . Anxiety   . Depression   . Bipolar disorder ([place])   . Diabetes mellitus, type II ([place])   . Fatigue   . Headache   . Uterine cancer [facility])     Past Surgical History  Procedure Laterality Date  . Abdominal hysterectomy    . Tonsillectomy      Family History  Problem Relation Age of Onset  . Hyperlipidemia Mother   . Bipolar disorder Mother   . Heart attack Father   . Drug abuse Father   . Anxiety disorder Sister   . Depression Sister   . Thyroid disease Maternal Grandmother   . Anxiety disorder Sister   . Depression Sister   . ADD / ADHD Sister   . Alcohol abuse Sister     Social History   Social History  . Marital Status: Married    Spouse Name: N/A  . Number of Children: N/A  . Years of Education: N/A   Occupational History  . Not on file.   Social History Main Topics  . Smoking status: Current Every Day Smoker -- 1.00 packs/day    Types: Cigarettes    Start date: 02/05/1994  . Smokeless tobacco: Never Used  . Alcohol Use: No  . Drug Use: No  . Sexual Activity: Yes    Birth Control/ Protection: None   Other  Topics Concern  . Not on file   Social History Narrative    Current outpatient prescriptions:  .  clonazePAM (KLONOPIN) 0.5 MG tablet, Take 3 tablets (1.5 mg total) by mouth at bedtime., Disp: 90 tablet, Rfl: 3 .  Diethylpropion HCl CR 75 MG TB24, Take by mouth., Disp: , Rfl:  .  estradiol (ESTRACE) 1 MG tablet, Take by mouth., Disp: , Rfl:  .  meloxicam (MOBIC) 15 MG tablet, Take 1 tablet (15 mg total) by mouth daily., Disp: 30 tablet, Rfl: 2 .  modafinil (PROVIGIL) 200 MG tablet, Take 200 mg by mouth daily., Disp: , Rfl:  .  naproxen sodium (ANAPROX) 550 MG tablet, Take by mouth., Disp: , Rfl:  .  venlafaxine XR (EFFEXOR-XR) 150 MG 24 hr capsule, Take 1 capsule (150 mg total) by mouth daily., Disp: 30 capsule, Rfl: 3  Allergies  Allergen Reactions  . Ciprofloxacin   . Tramadol Rash    Review of Systems  Constitutional: Negative for fever, chills and weight loss.  Gastrointestinal: Positive for heartburn, abdominal pain and diarrhea (loose stool, but not always diarrhea). Negative for nausea, vomiting, constipation, blood in stool, melena, anorexia and flatus.  Genitourinary: Negative for dysuria, urgency and frequency.    Objective  Filed  Vitals:   06/18/15 0852  BP: 122/77  Pulse: 88  Temp: 97.7 F (36.5 C)  TempSrc: Oral  Resp: 17  Height: 5\' 4"  (1.626 m)  Weight: 286 lb 6.4 oz (129.91 kg)  SpO2: 96%    Physical Exam  Constitutional: She is oriented to person, place, and time and well-developed, well-nourished, and in no distress.  Cardiovascular: Normal rate, regular rhythm and normal heart sounds.   No murmur heard. Pulmonary/Chest: Effort normal and breath sounds normal. She has no wheezes.  Abdominal: Soft. Bowel sounds are normal. There is no tenderness.  Neurological: She is alert and oriented to person, place, and time.  Nursing note and vitals reviewed.     Assessment & Plan  1. Abdominal pain, RUQ (right upper quadrant) - US Abdomen Limited RUQ;  Future  2. Heartburn  - omeprazole (PRILOSEC) 20 MG capsule; Take 1 capsule (20 mg total) by mouth daily.  Dispense: 30 capsule; Refill: 0 - CBC with Differential - Comprehensive Metabolic Panel (CMET) - Amylase - Lipase   [name] Asad A. [facility] 06/18/2015 9:14 AM

## 2015-07-02 ENCOUNTER — Other Ambulatory Visit
Admission: RE | Admit: 2015-07-02 | Discharge: 2015-07-02 | Disposition: A | Discharge status: Home / Self Care | Payer: 59 | Source: Ambulatory Visit | Attending: Family Medicine | Admitting: Family Medicine

## 2015-07-02 DIAGNOSIS — Z029 Encounter for administrative examinations, unspecified: Secondary | ICD-10-CM | POA: Diagnosis present

## 2015-07-02 LAB — CBC WITH DIFFERENTIAL/PLATELET
BASOS ABS: 0.1 10*3/uL (ref 0–0.1)
BASOS PCT: 1 %
EOS ABS: 0.1 10*3/uL (ref 0–0.7)
Eosinophils Relative: 1 %
HEMATOCRIT: 43 % (ref 35.0–47.0)
HEMOGLOBIN: 14.6 g/dL (ref 12.0–16.0)
Lymphocytes Relative: 18 %
Lymphs Abs: 2 10*3/uL (ref 1.0–3.6)
MCH: 30.8 pg (ref 26.0–34.0)
MCHC: 34 g/dL (ref 32.0–36.0)
MCV: 90.7 fL (ref 80.0–100.0)
Monocytes Absolute: 0.7 10*3/uL (ref 0.2–0.9)
Monocytes Relative: 6 %
NEUTROS ABS: 8.7 10*3/uL — AB (ref 1.4–6.5)
NEUTROS PCT: 74 %
Platelets: 230 10*3/uL (ref 150–440)
RBC: 4.74 MIL/uL (ref 3.80–5.20)
RDW: 14.5 % (ref 11.5–14.5)
WBC: 11.6 10*3/uL — AB (ref 3.6–11.0)

## 2015-07-02 LAB — COMPREHENSIVE METABOLIC PANEL
ALBUMIN: 4.1 g/dL (ref 3.5–5.0)
ALK PHOS: 69 U/L (ref 38–126)
ALT: 23 U/L (ref 14–54)
ANION GAP: 6 (ref 5–15)
AST: 18 U/L (ref 15–41)
BILIRUBIN TOTAL: 0.5 mg/dL (ref 0.3–1.2)
BUN: 15 mg/dL (ref 6–20)
CHLORIDE: 104 mmol/L (ref 101–111)
CO2: 28 mmol/L (ref 22–32)
CREATININE: 0.78 mg/dL (ref 0.44–1.00)
Calcium: 9.5 mg/dL (ref 8.9–10.3)
GFR calc Af Amer: >60 mL/min (ref >60)
GFR calc non Af Amer: >60 mL/min (ref >60)
GLUCOSE: 106 mg/dL — AB (ref 65–99)
POTASSIUM: 4.3 mmol/L (ref 3.5–5.1)
SODIUM: 138 mmol/L (ref 135–145)
Total Protein: 7.8 g/dL (ref 6.5–8.1)

## 2015-07-02 LAB — LIPASE, BLOOD: LIPASE: 23 U/L (ref 11–51)

## 2015-07-02 LAB — AMYLASE: Amylase: 36 U/L (ref 28–100)

## 2015-07-10 ENCOUNTER — Telehealth: Payer: Self-pay | Admitting: Family Medicine

## 2015-07-10 NOTE — Telephone Encounter (Signed)
PT WANTS RESULTS OF TEST

## 2015-07-10 NOTE — Telephone Encounter (Signed)
Returned patient call and there was no after, i tried to leave message there was no voicemail, i called 2 other times and i have been unable to contact patient

## 2015-07-14 ENCOUNTER — Ambulatory Visit: Payer: 59

## 2015-07-20 ENCOUNTER — Ambulatory Visit (INDEPENDENT_AMBULATORY_CARE_PROVIDER_SITE_OTHER): Payer: 59 | Admitting: Family Medicine

## 2015-07-20 ENCOUNTER — Encounter: Payer: Self-pay | Admitting: Family Medicine

## 2015-07-20 VITALS — BP 120/79 | HR 100 | Temp 97.9°F | Resp 16 | Ht 64.0 in | Wt 282.8 lb

## 2015-07-20 DIAGNOSIS — Z23 Encounter for immunization: Secondary | ICD-10-CM | POA: Diagnosis not present

## 2015-07-20 DIAGNOSIS — B353 Tinea pedis: Secondary | ICD-10-CM

## 2015-07-20 MED ORDER — FLUCONAZOLE 150 MG PO TABS: 150.0000 mg | ORAL_TABLET | ORAL | Status: DC

## 2015-07-20 NOTE — Progress Notes (Signed)
Name: Farrell Ours   MRN: [id number]    DOB: 1976/04/03   Date:07/20/2015       Progress Note  Subjective  Chief Complaint  Chief Complaint  Patient presents with  . Follow-up    1 mo    Rash This is a recurrent problem. The current episode started more than 1 month ago (2 months.). The problem has been gradually worsening since onset. The affected locations include the right foot. The rash is characterized by dryness, scaling and redness. Pertinent negatives include no fever. Treatments tried: Has tried topical cream (does not know its name) for relief. The treatment provided no relief.    Pt. Is here for laboratory and imaging evaluation. Ordered Ultrasound of Abdomen of RUQ for evaluation of gallstones. Lab work revealed elevated WBC count, pt. Has not had a chance to obtain the Ultrasound until now.  Past Medical History  Diagnosis Date  . ADHD (attention deficit hyperactivity disorder)   . Anxiety   . Depression   . Bipolar disorder ([place])   . Diabetes mellitus, type II ([place])   . Fatigue   . Headache   . Uterine cancer [facility])     Past Surgical History  Procedure Laterality Date  . Abdominal hysterectomy    . Tonsillectomy      Family History  Problem Relation Age of Onset  . Hyperlipidemia Mother   . Bipolar disorder Mother   . Heart attack Father   . Drug abuse Father   . Anxiety disorder Sister   . Depression Sister   . Thyroid disease Maternal Grandmother   . Anxiety disorder Sister   . Depression Sister   . ADD / ADHD Sister   . Alcohol abuse Sister     Social History   Social History  . Marital Status: Married    Spouse Name: N/A  . Number of Children: N/A  . Years of Education: N/A   Occupational History  . Not on file.   Social History Main Topics  . Smoking status: Current Every Day Smoker -- 1.00 packs/day    Types: Cigarettes    Start date: 02/05/1994  . Smokeless tobacco: Never Used  . Alcohol Use: No  . Drug Use: No  . Sexual  Activity: Yes    Birth Control/ Protection: None   Other Topics Concern  . Not on file   Social History Narrative     Current outpatient prescriptions:  .  clonazePAM (KLONOPIN) 0.5 MG tablet, Take 3 tablets (1.5 mg total) by mouth at bedtime., Disp: 90 tablet, Rfl: 3 .  Diethylpropion HCl CR 75 MG TB24, Take by mouth., Disp: , Rfl:  .  estradiol (ESTRACE) 1 MG tablet, Take by mouth., Disp: , Rfl:  .  meloxicam (MOBIC) 15 MG tablet, Take 1 tablet (15 mg total) by mouth daily., Disp: 30 tablet, Rfl: 2 .  modafinil (PROVIGIL) 200 MG tablet, Take 200 mg by mouth daily., Disp: , Rfl:  .  omeprazole (PRILOSEC) 20 MG capsule, Take 1 capsule (20 mg total) by mouth daily., Disp: 30 capsule, Rfl: 0 .  venlafaxine XR (EFFEXOR-XR) 150 MG 24 hr capsule, Take 1 capsule (150 mg total) by mouth daily., Disp: 30 capsule, Rfl: 3 .  naproxen sodium (ANAPROX) 550 MG tablet, Take by mouth. Reported on 07/20/2015, Disp: , Rfl:   Allergies  Allergen Reactions  . Ciprofloxacin   . Tramadol Rash     Review of Systems  Constitutional: Negative for fever, chills and weight loss.  Skin: Positive for rash. Negative for itching.    Objective  Filed Vitals:   07/20/15 1026  BP: 120/79  Pulse: 100  Temp: 97.9 F (36.6 C)  TempSrc: Oral  Resp: 16  Height: 5\' 4"  (1.626 m)  Weight: 282 lb 12.8 oz (128.277 kg)  SpO2: 93%    Physical Exam  Constitutional: She is oriented to person, place, and time and well-developed, well-nourished, and in no distress.  Neurological: She is alert and oriented to person, place, and time.  Skin: Rash noted. Rash is macular.  Macular, erythematous, scaly rash along the medial and lateral margins of the right foot.  Nursing note and vitals reviewed.    Assessment & Plan  1. Need for immunization against influenza  - Flu Vaccine QUAD 36+ mos PF IM (Fluarix & Fluzone Quad PF)  2. Tinea pedis of right foot Failed topical therapy. We'll start on Diflucan once  weekly 2 weeks. Reassess in 2 weeks. - fluconazole (DIFLUCAN) 150 MG tablet; Take 1 tablet (150 mg total) by mouth once a week.  Dispense: 2 tablet; Refill: 0  Deziree Mokry Asad A[facility] 07/20/2015 10:50 AM

## 2015-07-31 ENCOUNTER — Encounter: Payer: Self-pay | Admitting: Family Medicine

## 2015-07-31 ENCOUNTER — Ambulatory Visit (INDEPENDENT_AMBULATORY_CARE_PROVIDER_SITE_OTHER): Payer: 59 | Admitting: Family Medicine

## 2015-07-31 VITALS — BP 120/80 | HR 97 | Temp 98.5°F | Resp 20 | Ht 64.0 in | Wt 281.7 lb

## 2015-07-31 DIAGNOSIS — L309 Dermatitis, unspecified: Secondary | ICD-10-CM

## 2015-07-31 MED ORDER — TRIAMCINOLONE ACETONIDE 0.5 % EX OINT: 1.0000 "application " | TOPICAL_OINTMENT | Freq: Two times a day (BID) | CUTANEOUS | Status: DC

## 2015-07-31 NOTE — Progress Notes (Signed)
Name: Dana Whitaker   MRN: [id number]    DOB: 1976/02/09   Date:07/31/2015       Progress Note  Subjective  Chief Complaint  Chief Complaint  Patient presents with  . Follow-up    1 wk     Rash This is a recurrent problem. The problem has been waxing and waning since onset. The affected locations include the right foot. The rash is characterized by dryness and itchiness. She was exposed to nothing. Pertinent negatives include no fever. Treatments tried: Has used Lamisil and has not helped. also tried Diflucan which resulted in moderate improvement. The treatment provided no relief.    Past Medical History  Diagnosis Date  . ADHD (attention deficit hyperactivity disorder)   . Anxiety   . Depression   . Bipolar disorder ([place])   . Diabetes mellitus, type II ([place])   . Fatigue   . Headache   . Uterine cancer [facility])     Past Surgical History  Procedure Laterality Date  . Abdominal hysterectomy    . Tonsillectomy      Family History  Problem Relation Age of Onset  . Hyperlipidemia Mother   . Bipolar disorder Mother   . Heart attack Father   . Drug abuse Father   . Anxiety disorder Sister   . Depression Sister   . Thyroid disease Maternal Grandmother   . Anxiety disorder Sister   . Depression Sister   . ADD / ADHD Sister   . Alcohol abuse Sister     Social History   Social History  . Marital Status: Married    Spouse Name: N/A  . Number of Children: N/A  . Years of Education: N/A   Occupational History  . Not on file.   Social History Main Topics  . Smoking status: Current Every Day Smoker -- 1.00 packs/day    Types: Cigarettes    Start date: 02/05/1994  . Smokeless tobacco: Never Used  . Alcohol Use: No  . Drug Use: No  . Sexual Activity: Yes    Birth Control/ Protection: None   Other Topics Concern  . Not on file   Social History Narrative     Current outpatient prescriptions:  .  clonazePAM (KLONOPIN) 0.5 MG tablet, Take 3 tablets (1.5 mg total)  by mouth at bedtime., Disp: 90 tablet, Rfl: 3 .  Diethylpropion HCl CR 75 MG TB24, Take by mouth., Disp: , Rfl:  .  estradiol (ESTRACE) 1 MG tablet, Take by mouth., Disp: , Rfl:  .  meloxicam (MOBIC) 15 MG tablet, Take 1 tablet (15 mg total) by mouth daily., Disp: 30 tablet, Rfl: 2 .  modafinil (PROVIGIL) 200 MG tablet, Take 200 mg by mouth daily., Disp: , Rfl:  .  omeprazole (PRILOSEC) 20 MG capsule, Take 1 capsule (20 mg total) by mouth daily., Disp: 30 capsule, Rfl: 0 .  venlafaxine XR (EFFEXOR-XR) 150 MG 24 hr capsule, Take 1 capsule (150 mg total) by mouth daily., Disp: 30 capsule, Rfl: 3 .  fluconazole (DIFLUCAN) 150 MG tablet, Take 1 tablet (150 mg total) by mouth once a week. (Patient not taking: Reported on 07/31/2015), Disp: 2 tablet, Rfl: 0 .  naproxen sodium (ANAPROX) 550 MG tablet, Take by mouth. Reported on 07/31/2015, Disp: , Rfl:   Allergies  Allergen Reactions  . Ciprofloxacin   . Tramadol Rash     Review of Systems  Constitutional: Negative for fever and chills.  Skin: Positive for rash.    Objective  Filed Vitals:  07/31/15 1049  BP: 120/80  Pulse: 97  Temp: 98.5 F (36.9 C)  TempSrc: Oral  Resp: 20  Height: 5\' 4"  (1.626 m)  Weight: 281 lb 11.2 oz (127.778 kg)  SpO2: 96%    Physical Exam  Skin: Rash noted. Rash is macular.  Whitish continuous scaly, pruritic rash along the right foot  Nursing note and vitals reviewed.     Assessment & Plan  1. Dermatitis of foot Started on mid potency topical corticosteroid for relief. - triamcinolone ointment (KENALOG) 0.5 %; Apply 1 application topically 2 (two) times daily.  Dispense: 30 g; Refill: 0   Cortlan Dolin Asad A[facility] 07/31/2015 11:17 AM

## 2015-11-10 ENCOUNTER — Ambulatory Visit (INDEPENDENT_AMBULATORY_CARE_PROVIDER_SITE_OTHER): Payer: 59 | Admitting: Obstetrics and Gynecology

## 2015-11-10 ENCOUNTER — Encounter: Payer: Self-pay | Admitting: Obstetrics and Gynecology

## 2015-11-10 VITALS — BP 147/66 | HR 71 | Ht 63.0 in | Wt 289.6 lb

## 2015-11-10 DIAGNOSIS — C55 Malignant neoplasm of uterus, part unspecified: Secondary | ICD-10-CM

## 2015-11-10 DIAGNOSIS — N958 Other specified menopausal and perimenopausal disorders: Secondary | ICD-10-CM | POA: Diagnosis not present

## 2015-11-10 DIAGNOSIS — Z90710 Acquired absence of both cervix and uterus: Secondary | ICD-10-CM | POA: Diagnosis not present

## 2015-11-10 DIAGNOSIS — Z1231 Encounter for screening mammogram for malignant neoplasm of breast: Secondary | ICD-10-CM | POA: Diagnosis not present

## 2015-11-10 DIAGNOSIS — Z6841 Body Mass Index (BMI) 40.0 and over, adult: Secondary | ICD-10-CM

## 2015-11-10 DIAGNOSIS — Z0000 Encounter for general adult medical examination without abnormal findings: Secondary | ICD-10-CM | POA: Diagnosis not present

## 2015-11-10 DIAGNOSIS — Z01419 Encounter for gynecological examination (general) (routine) without abnormal findings: Secondary | ICD-10-CM

## 2015-11-10 DIAGNOSIS — E8940 Asymptomatic postprocedural ovarian failure: Secondary | ICD-10-CM

## 2015-11-10 NOTE — Addendum Note (Signed)
Addended by: Elouise Munroe on: 11/10/2015 03:33 PM   Modules accepted: Orders

## 2015-11-10 NOTE — Patient Instructions (Addendum)
1. Pap smear 2. Mammogram 3. Referral to [facility] surgery [facility] [place] [facility] 4. Continue with healthy eating, exercise, and weight loss 5. Calcium with vitamin D supplementation 6. Follow-up with Dr. Theora Gianotti in 6 months 7. Return here in 1 year for annual physical 8. Smoking cessation 9. Decrease caffeine intake.

## 2015-11-10 NOTE — Progress Notes (Signed)
Dana Whitaker ID: Dana Whitaker, female   DOB: [birth year], 40 y.o.   MRN: [id number] ANNUAL PREVENTATIVE CARE GYN  ENCOUNTER NOTE  Subjective:       Dana Whitaker is a 40 y.o. G39P1001 female here for a routine annual gynecologic exam.  Current complaints: 1.  Wants diet pills. Prior attempt to lose weight resulted in 30 lb weight loss, but Dana Whitaker states she has gained back 50 lbs, so she is up 20 lbs from her baseline. She has not been able to exercise due to injuries to both ankles. Has been eating better recently, now that she is no longer working in [employer]. 2. Increased Urinary urgency and SUI. Notices increased frequency as well and at least one episode nocturia nightly.  3. Breast tenderness. Worse on the right side. Denies any lumps or nipple discharge. 4. 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 Contraception: status post hysterectomy Last Pap: 11/06/2014 NEG/NEG. Results were: normal Last mammogram: N/A. 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 Tumor size 0.4 cm, no myometrial invasion. LVSI not identified.   Obstetric History OB History  Gravida Para Term Preterm AB SAB TAB Ectopic Multiple Living  1 1 1       1     # Outcome Date GA Lbr Len/2nd Weight Sex Delivery Anes PTL Lv  1 Term 1998   7 lb 8 oz (3.402 kg) M Vag-Spont   Y      Past Medical History  Diagnosis Date  . ADHD (attention deficit hyperactivity disorder)   . Anxiety   . Depression   . Bipolar disorder ([place])   . Diabetes mellitus, type II ([place])   . Fatigue   . Headache   . Uterine cancer ([place])   . Breast pain   . Fibromyalgia   . Obesity   . Sleep apnea   . Postcoital  bleeding   . Eczema     Past Surgical History  Procedure Laterality Date  . Tonsillectomy    . Abdominal hysterectomy      tah.bso  . Leep    . Carpal tunnel release    . Varicose vein surgery      Current Outpatient Prescriptions on File Prior to Visit  Medication Sig Dispense Refill  . triamcinolone ointment (KENALOG) 0.5 % Apply 1 application topically 2 (two) times daily. 30 g 0   No current facility-administered medications on file prior to visit.    Allergies  Allergen Reactions  . Ciprofloxacin   . Tramadol Rash    Social History   Social History  . Marital Status: Married    Spouse Name: N/A  . Number of Children: N/A  . Years of Education: N/A   Occupational History  . Not on file.   Social History Main Topics  . Smoking status: Current Every Day Smoker -- 1.00 packs/day    Types: Cigarettes    Start date: 02/05/1994  . Smokeless tobacco: Never Used  . Alcohol Use: 0.0 oz/week    0 Standard drinks or equivalent per week     Comment: occas  . Drug Use: No  . Sexual Activity: Yes    Birth Control/ Protection: None   Other Topics Concern  . Not on file  Social History Narrative    Family History  Problem Relation Age of Onset  . Hyperlipidemia Mother   . Bipolar disorder Mother   . Heart attack Father   . Drug abuse Father   . Anxiety disorder Sister   . Depression Sister   . Thyroid disease Maternal Grandmother   . Diabetes Maternal Grandmother   . Anxiety disorder Sister   . Depression Sister   . ADD / ADHD Sister   . Alcohol abuse Sister   . Ovarian cancer Neg Hx   . Breast cancer Neg Hx   . Colon cancer Neg Hx     The following portions of the Dana Whitaker's history were reviewed and updated as appropriate: allergies, current medications, past family history, past medical history, past social history, past surgical history and problem list.  Review of Systems ROS Review of Systems - General ROS: negative for - chills, fatigue, fever  or weight loss; + mild hot flashes and night sweats, +weight gain Psychological ROS: Not Asked Ophthalmic ROS: negative for - blurry vision, eye pain or loss of vision ENT ROS: negative for - headaches, hearing change, visual changes or vocal changes Allergy and Immunology ROS: negative for - hives, itchy/watery eyes; +seasonal allergies Hematological and Lymphatic ROS: negative for - bleeding problems, bruising, swollen lymph nodes or weight loss; + varicose veins Endocrine ROS: negative for - galactorrhea, hair pattern changes, hot flashes, malaise/lethargy, mood swings, palpitations, skin changes, temperature intolerance or unexpected weight changes; +polyuria Breast ROS: negative for - new or changing breast lumps or nipple discharge; +tenderness Respiratory ROS: negative for -  shortness of breath; +chronic cough Cardiovascular ROS: negative for - chest pain, irregular heartbeat, palpitations or shortness of breath Gastrointestinal ROS: no abdominal pain,  black or bloody stools; + change in bowel habits Genito-Urinary ROS: no dysuria, trouble voiding, or hematuria; +urgency, +nocturia Musculoskeletal ROS: negative for - joint pain or joint stiffness Neurological ROS: negative for - bowel and bladder control changes Dermatological ROS: negative for rash and skin lesion changes   Objective:   BP 147/66 mmHg  Pulse 71  Ht 5\' 3"  (1.6 m)  Wt 289 lb 9.6 oz (131.362 kg)  BMI 51.31 kg/m2  LMP 08/04/2013 CONSTITUTIONAL: Well-developed, morbidly obese female in no acute distress.  PSYCHIATRIC: Normal mood and affect. Normal behavior. Normal judgment and thought content. [place]: Alert and oriented to person, place, and time. Normal muscle tone coordination. No cranial nerve deficit noted. HENT:  Normocephalic, atraumatic, External right and left ear normal. Oropharynx is clear and moist EYES: Conjunctivae and EOM are normal. Pupils are equal, round, and reactive to light. No scleral  icterus.  NECK: Normal range of motion, supple, no masses.  Normal thyroid.  SKIN: Skin is warm and dry. No rash noted. Not diaphoretic. No erythema. No pallor. CARDIOVASCULAR: Normal heart rate noted, regular rhythm, no murmur. RESPIRATORY: Clear to auscultation bilaterally. Effort and breath sounds normal, no problems with respiration noted. BREASTS: Symmetric in size. No masses, skin changes, nipple drainage, or lymphadenopathy.  ABDOMEN: No abdominal masses. Laparoscopy incisions well-healed BLADDER: Normal PELVIC:  External Genitalia: Normal  BUS: Normal  Vagina: Good estrogen effect, vault intact.  Cervix: Surgically Absent  Uterus: Surgically Absent  Adnexa: Surgically Absent  RV: No Rectal Masses and Normal Sphincter tone  MUSCULOSKELETAL: Normal range of motion. No tenderness.  No cyanosis, clubbing, or edema.  2+ distal pulses. LYMPHATIC: No Axillary, Supraclavicular, or Inguinal Adenopathy.    Assessment:   Annual gynecologic examination 40 y.o. Contraception:  status post hysterectomy Obesity 2 Endometrioid adenocarcinoma, status post TL H BSO Mastodynia, right breast  Plan:  Pap: Pap Co Test Mammogram: Ordered  Stool Guaiac Testing:  Not Indicated Labs: THRU PCP Routine preventative health maintenance measures emphasized: Exercise/Diet/Weight control, Tobacco Warnings, Alcohol/Substance use risks and Stress Management  1. Discussed anticholinergic medication with Dana Whitaker for bladder sxs. Dana Whitaker declined at this time. 2. Mammogram ordered. Dana Whitaker advised on decreasing caffeine intake for breast tenderness. 3. Gave Dana Whitaker information on bariatric weight loss program to contact and attend information session so that she may try to enroll. No diet pills given at this time. Return to [place], Student-PA  Brayton Mars, MD   I have seen, interviewed, and examined the Dana Whitaker in conjunction with the [facility].A. student and  affirm the diagnosis and management plan. Taggart Prasad A. Manases [place], MD, FACOG   Note: This dictation was prepared with Dragon dictation along with smaller phrase technology. Any transcriptional errors that result from this process are unintentional.

## 2015-11-13 LAB — PAP IG AND HPV HIGH-RISK
HPV, high-risk: POSITIVE — AB
PAP SMEAR COMMENT: 0

## 2015-12-22 ENCOUNTER — Encounter: Payer: Self-pay | Admitting: Obstetrics and Gynecology

## 2015-12-22 ENCOUNTER — Ambulatory Visit (INDEPENDENT_AMBULATORY_CARE_PROVIDER_SITE_OTHER): Payer: 59 | Admitting: Obstetrics and Gynecology

## 2015-12-22 VITALS — BP 111/75 | HR 76 | Ht 63.0 in | Wt 283.9 lb

## 2015-12-22 DIAGNOSIS — N958 Other specified menopausal and perimenopausal disorders: Secondary | ICD-10-CM

## 2015-12-22 DIAGNOSIS — E8940 Asymptomatic postprocedural ovarian failure: Secondary | ICD-10-CM

## 2015-12-22 DIAGNOSIS — Z8544 Personal history of malignant neoplasm of other female genital organs: Secondary | ICD-10-CM | POA: Diagnosis not present

## 2015-12-22 DIAGNOSIS — B977 Papillomavirus as the cause of diseases classified elsewhere: Secondary | ICD-10-CM | POA: Diagnosis not present

## 2015-12-22 DIAGNOSIS — C55 Malignant neoplasm of uterus, part unspecified: Secondary | ICD-10-CM

## 2015-12-22 DIAGNOSIS — Z90710 Acquired absence of both cervix and uterus: Secondary | ICD-10-CM

## 2015-12-22 NOTE — Progress Notes (Signed)
Chief complaint: 1. Normal/high risk HPV positive Pap smear 2. History of endometrial adenocarcinoma, well-differentiated endometrioid 3. Status post TL H BSO  PROCEDURE: Upper adjacent vagina Indications: Positive high risk HPV on Pap smear Findings: Normal vagina on colposcopy Testing: Pap smear; no biopsies Description: Patient was placed in the dorsal lithotomy position. A Graves' speculum is inserted in the vagina. The vagina is swabbed with acetic acid solution. Visualization of the vagina and vaginal cuff with the colposcope demonstrated no abnormal lesions. No biopsies were taken. Disposition: 1. Pap smear 2. Return in 6 months for repeat Pap smear  Brayton Mars, MD  Note: This dictation was prepared with Dragon dictation along with smaller phrase technology. Any transcriptional errors that result from this process are unintentional.

## 2015-12-22 NOTE — Patient Instructions (Addendum)
1. Return in 6 months for Pap smear 2. Avoid tobacco products

## 2015-12-23 LAB — PAP IG W/ RFLX HPV ASCU: PAP SMEAR COMMENT: 0

## 2015-12-25 ENCOUNTER — Encounter: Payer: Self-pay | Admitting: Obstetrics and Gynecology

## 2016-01-21 ENCOUNTER — Other Ambulatory Visit: Payer: Self-pay | Admitting: General Surgery

## 2016-01-27 ENCOUNTER — Other Ambulatory Visit: Payer: Self-pay | Admitting: General Surgery

## 2016-01-27 ENCOUNTER — Other Ambulatory Visit (HOSPITAL_COMMUNITY): Payer: Self-pay | Admitting: General Surgery

## 2016-01-27 DIAGNOSIS — Z6841 Body Mass Index (BMI) 40.0 and over, adult: Principal | ICD-10-CM

## 2016-02-03 ENCOUNTER — Encounter: Payer: Self-pay | Admitting: Family Medicine

## 2016-02-04 ENCOUNTER — Other Ambulatory Visit: Payer: Self-pay

## 2016-02-04 ENCOUNTER — Ambulatory Visit (HOSPITAL_COMMUNITY)
Admission: RE | Admit: 2016-02-04 | Discharge: 2016-02-04 | Disposition: A | Discharge status: Home / Self Care | Payer: 59 | Source: Ambulatory Visit | Attending: General Surgery | Admitting: General Surgery

## 2016-02-04 ENCOUNTER — Ambulatory Visit (HOSPITAL_COMMUNITY)
Admission: RE | Admit: 2016-02-04 | Discharge: 2016-02-04 | Disposition: A | Discharge status: Home / Self Care | Payer: 59 | Source: Ambulatory Visit

## 2016-02-04 DIAGNOSIS — Z6841 Body Mass Index (BMI) 40.0 and over, adult: Secondary | ICD-10-CM | POA: Insufficient documentation

## 2016-02-10 ENCOUNTER — Ambulatory Visit: Payer: Self-pay | Admitting: Family Medicine

## 2016-02-17 ENCOUNTER — Ambulatory Visit (INDEPENDENT_AMBULATORY_CARE_PROVIDER_SITE_OTHER): Payer: 59 | Admitting: Family Medicine

## 2016-02-17 ENCOUNTER — Encounter: Payer: Self-pay | Admitting: Family Medicine

## 2016-02-17 DIAGNOSIS — R7303 Prediabetes: Secondary | ICD-10-CM | POA: Diagnosis not present

## 2016-02-17 DIAGNOSIS — D72829 Elevated white blood cell count, unspecified: Secondary | ICD-10-CM

## 2016-02-17 DIAGNOSIS — Z720 Tobacco use: Secondary | ICD-10-CM

## 2016-02-17 DIAGNOSIS — Z87891 Personal history of nicotine dependence: Secondary | ICD-10-CM | POA: Insufficient documentation

## 2016-02-17 MED ORDER — BUPROPION HCL ER (SMOKING DET) 150 MG PO TB12: 150.0000 mg | ORAL_TABLET | Freq: Two times a day (BID) | ORAL | 0 refills | Status: DC

## 2016-02-17 NOTE — Progress Notes (Signed)
Name: Dana Whitaker   MRN: [id number]    DOB: 12-21-1975   Date:02/17/2016       Progress Note  Subjective  Chief Complaint  Chief Complaint  Patient presents with  . Follow-up    discuss lab results for Bariatric Surgery  . Nicotine Dependence    want to quit smoking    Nicotine Dependence  Presents for initial visit. Symptoms include cravings. Preferred tobacco types include cigarettes. Her urge triggers include drinking coffee, driving, meal time and stress. Risk factors do not include company of smokers.She smokes 1 pack of cigarettes per day. Past treatments include nicotine patch.   She is also here to discuss preoperative lab work obtained by Insurance risk surveyor. This included an elevated WBC count, elevated hemoglobin A1c, and elevated triglycerides.  Past Medical History:  Diagnosis Date  . ADHD (attention deficit hyperactivity disorder)   . Anxiety   . Bipolar disorder ([place])   . Breast pain   . Depression   . Diabetes mellitus, type II ([place])   . Eczema   . Fatigue   . Fibromyalgia   . Headache   . Obesity   . Postcoital bleeding   . Sleep apnea   . Uterine cancer [facility])     Past Surgical History:  Procedure Laterality Date  . ABDOMINAL HYSTERECTOMY     tah.bso  . CARPAL TUNNEL RELEASE    . LEEP    . TONSILLECTOMY    . VARICOSE VEIN SURGERY      Family History  Problem Relation Age of Onset  . Hyperlipidemia Mother   . Bipolar disorder Mother   . Heart attack Father   . Drug abuse Father   . Anxiety disorder Sister   . Depression Sister   . Anxiety disorder Sister   . Depression Sister   . ADD / ADHD Sister   . Alcohol abuse Sister   . Thyroid disease Maternal Grandmother   . Diabetes Maternal Grandmother   . Ovarian cancer Neg Hx   . Breast cancer Neg Hx   . Colon cancer Neg Hx     Social History   Social History  . Marital status: Married    Spouse name: N/A  . Number of children: N/A  . Years of education: N/A   Occupational History  .  Not on file.   Social History Main Topics  . Smoking status: Current Every Day Smoker    Packs/day: 1.00    Types: Cigarettes    Start date: 02/05/1994  . Smokeless tobacco: Never Used  . Alcohol use 0.0 oz/week     Comment: occas  . Drug use: No  . Sexual activity: Yes    Birth control/ protection: None   Other Topics Concern  . Not on file   Social History Narrative  . No narrative on file    No current outpatient prescriptions on file.  Allergies  Allergen Reactions  . Ciprofloxacin   . Tramadol Rash     Review of Systems  Constitutional: Negative for chills, fever and malaise/fatigue.  Respiratory: Negative for cough.   Cardiovascular: Negative for chest pain.    Objective  Vitals:   02/17/16 1506  BP: 130/74  Pulse: (!) 102  Resp: 18  Temp: 98.9 F (37.2 C)  TempSrc: Oral  SpO2: 97%  Weight: 287 lb 12.8 oz (130.5 kg)  Height: 5\' 4"  (1.626 m)    Physical Exam  Constitutional: She is oriented to person, place, and time and well-developed, well-nourished, and  in no distress.  HENT:  Head: Normocephalic and atraumatic.  Cardiovascular: Normal rate, regular rhythm and normal heart sounds.   No murmur heard. Pulmonary/Chest: Effort normal and breath sounds normal. She has no wheezes.  Abdominal: Soft. Bowel sounds are normal. There is no tenderness.  Neurological: She is alert and oriented to person, place, and time.  Psychiatric: Mood, memory, affect and judgment normal.  Nursing note and vitals reviewed.     Assessment & Plan  1. Tobacco abuse Started on bupropion, patient must stop smoking before the bariatric surgery can be planned and scheduled. - buPROPion (ZYBAN) 150 MG 12 hr tablet; Take 1 tablet (150 mg total) by mouth 2 (two) times daily. Start 150 mg po qday x 3 days, stop smoking after 7 days of treatment, last dose NLT 6PM.  Dispense: 180 tablet; Refill: 0  2. Prediabetes A1c was 6.2%, would likely improve with weight loss  surgery.  3. Leukocytosis  - CBC with Differential   Stevee Valenta Asad [facility] 02/17/2016 3:18 PM

## 2016-04-14 ENCOUNTER — Encounter: Payer: Self-pay | Admitting: Dietician

## 2016-04-14 ENCOUNTER — Encounter: Payer: 59 | Attending: General Surgery | Admitting: Dietician

## 2016-04-14 DIAGNOSIS — Z6841 Body Mass Index (BMI) 40.0 and over, adult: Secondary | ICD-10-CM | POA: Insufficient documentation

## 2016-04-14 DIAGNOSIS — E668 Other obesity: Secondary | ICD-10-CM

## 2016-04-14 DIAGNOSIS — Z720 Tobacco use: Secondary | ICD-10-CM | POA: Diagnosis not present

## 2016-04-14 NOTE — Progress Notes (Signed)
  Pre-Op Assessment Visit:  Pre-Operative Sleeve gastrectomy Surgery  Medical Nutrition Therapy:  Appt start time: 0930   End time:  1040  Patient was seen on 04/14/2016 for Pre-Operative Nutrition Assessment. Assessment and letter of approval faxed to [facility] Surgery Bariatric Surgery Program coordinator on 04/14/2016.   Preferred Learning Style:   No preference indicated   Learning Readiness:   Ready  Handouts given during visit include:  Pre-Op Goals Bariatric Surgery Protein Shakes   During the appointment today the following Pre-Op Goals were reviewed with the patient: Maintain or lose weight as instructed by your surgeon Make healthy food choices Begin to limit portion sizes Limited concentrated sugars and fried foods Keep fat/sugar in the single digits per serving on   food labels Practice CHEWING your food  (aim for 30 chews per bite or until applesauce consistency) Practice not drinking 15 minutes before, during, and 30 minutes after each meal/snack Avoid all carbonated beverages  Avoid/limit caffeinated beverages  Avoid all sugar-sweetened beverages Consume 3 meals per day; eat every 3-5 hours Make a list of non-food related activities Aim for 64-100 ounces of FLUID daily  Aim for at least 60-80 grams of PROTEIN daily Look for a liquid protein source that contain ?15 g protein and ?5 g carbohydrate  (ex: shakes, drinks, shots)  Demonstrated degree of understanding via:  Teach Back  Teaching Method Utilized:  Visual Auditory Hands on  Barriers to learning/adherence to lifestyle change: personal and work stress  Patient to call the [facility] to enroll in Pre-Op and Post-Op Nutrition Education when surgery date is scheduled.

## 2016-05-17 ENCOUNTER — Ambulatory Visit (INDEPENDENT_AMBULATORY_CARE_PROVIDER_SITE_OTHER): Payer: 59 | Admitting: Internal Medicine

## 2016-05-17 ENCOUNTER — Encounter: Payer: Self-pay | Admitting: Internal Medicine

## 2016-05-17 DIAGNOSIS — R0600 Dyspnea, unspecified: Secondary | ICD-10-CM | POA: Diagnosis not present

## 2016-05-17 DIAGNOSIS — J9811 Atelectasis: Secondary | ICD-10-CM | POA: Diagnosis not present

## 2016-05-17 DIAGNOSIS — R0689 Other abnormalities of breathing: Secondary | ICD-10-CM | POA: Diagnosis not present

## 2016-05-17 NOTE — Progress Notes (Signed)
[facility] Consultation      Assessment and Plan:  Preoperative pulmonary exam. -The patient is "cleared" for procedures. From respiratory standpoint. -She is instructed not to smoke for the procedure, as this increases her risk of respiratory problems. -She is instructed that if she develops a chest cold, or other respiratory problems before the surgery. She should postpone her surgery.  Dyspnea.  -I suspect the patient's dyspnea is probably due to morbid obesity with deconditioning, and atelectasis, as well as a her history of nicotine abuse. -Recommend weight loss, and continued activity.  Nicotine abuse.  --She has recently quit smoking in preparation for her procedure, and is encouraged to continue cessation. --Spent > 3 min in couseling.   Atelectasis.  -Suspect the patient has some degree of basilar atelectasis due to morbid obesity. -Recommended that she try to increase her activity level to help with this.  Obstructive sleep apnea. -Discussed restarting CPAP, she does not appear to be interested at this time, this could be readdressed in the future.  Date: 05/17/2016  MRN# [id number] Dana Whitaker Aug 03, 1975    Dana Whitaker is a 40 y.o. old female seen in consultation for chief complaint of:    Chief Complaint  Patient presents with  . PULMONARY CONSULT    per Dr. Redmond Pulling, pt needing clearance for bariatric surgery. pt c/o sob with exertion & occ non prod cough.     HPI:    She is being worked up for bariatric surgery in [place]. She notes that her breathing is ok at rest. She smoked 1ppd for about 25 years. She quit 1 week ago for the surgery.  She notes that she is winded with stairs. She works in an [employer] claims handler, and get dyspnea when walking up a flight of stairs or when talking on the phone. She notes in the past that the breathing is better when she loses weight. She started bupropion but stopped it because it did not help. She tried  patches but she thinks that it made her cravings worse.   She notes that her breathing is better since she quit smoking. She goes to zumba after not doing it for a year. She is able to participate but feels like she is "going to die", because her stamina is not where it was when she was doing it in the past.  She last has surgery for hysterectomy 2015, and that was well tolerated.   She has never been diagnosed with respiratory problems in the past, she get occasional bouts of bronchitis due to colds.   Pt ambulated in office today at brisk pace, she has mild dyspnea. Sat dropped to 92%.   She has a CPAP at home but is not interested in restarting it.   PMHX:   Past Medical History:  Diagnosis Date  . ADHD (attention deficit hyperactivity disorder)   . Anxiety   . Bipolar disorder ([place])   . Breast pain   . Depression   . Diabetes mellitus, type II ([place])   . Eczema   . Fatigue   . Fibromyalgia   . Headache   . Obesity   . Postcoital bleeding   . Sleep apnea   . Uterine cancer [facility])    Surgical Hx:  Past Surgical History:  Procedure Laterality Date  . ABDOMINAL HYSTERECTOMY     tah.bso  . CARPAL TUNNEL RELEASE    . LEEP    . TONSILLECTOMY    . VARICOSE VEIN SURGERY  Family Hx:  Family History  Problem Relation Age of Onset  . Hyperlipidemia Mother   . Bipolar disorder Mother   . Heart attack Father   . Drug abuse Father   . Anxiety disorder Sister   . Depression Sister   . Anxiety disorder Sister   . Depression Sister   . ADD / ADHD Sister   . Alcohol abuse Sister   . Thyroid disease Maternal Grandmother   . Diabetes Maternal Grandmother   . Stroke Maternal Grandmother   . Ovarian cancer Neg Hx   . Breast cancer Neg Hx   . Colon cancer Neg Hx    Social Hx:   Social History  Substance Use Topics  . Smoking status: Former Smoker    Packs/day: 1.00    Years: 23.00    Types: Cigarettes    Start date: 02/05/1994    Quit date: 05/10/2016  . Smokeless  tobacco: Never Used  . Alcohol use 0.0 oz/week     Comment: occas   Medication:     Reviewed.   Allergies:  Ciprofloxacin and Tramadol  Review of Systems: Gen:  Denies  fever, sweats, chills HEENT: Denies blurred vision, double vision. bleeds, sore throat Cvc:  No dizziness, chest pain. Resp:   Denies cough or sputum production, shortness of breath Gi: Denies swallowing difficulty, stomach pain. Gu:  Denies bladder incontinence, burning urine Ext:   No Joint pain, stiffness. Skin: No skin rash,  hives  Endoc:  No polyuria, polydipsia. Psych: No depression, insomnia. Other:  All other systems were reviewed with the patient and were negative other that what is mentioned in the HPI.   Physical Examination:   VS: BP 122/62 (BP Location: Left Arm, Cuff Size: Normal)   Pulse 94   Ht 5\' 3"  (1.6 m)   Wt 294 lb 12.8 oz (133.7 kg)   LMP 08/04/2013   SpO2 100%   BMI 52.22 kg/m   General Appearance: No distress  Neuro:without focal findings,  speech normal,  HEENT: PERRLA, EOM intact.   Pulmonary: normal breath sounds, No wheezing.  CardiovascularNormal S1,S2.  No m/r/g.   Abdomen: Benign, Soft, non-tender. Renal:  No costovertebral tenderness  GU:  No performed at this time. Endoc: No evident thyromegaly, no signs of acromegaly. Skin:   warm, no rashes, no ecchymosis  Extremities: normal, no cyanosis, clubbing.  Other findings:    LABORATORY PANEL:   CBC No results for input(s): WBC, HGB, HCT, PLT in the last 168 hours. ------------------------------------------------------------------------------------------------------------------  Chemistries  No results for input(s): NA, K, CL, CO2, GLUCOSE, BUN, CREATININE, CALCIUM, MG, AST, ALT, ALKPHOS, BILITOT in the last 168 hours.  Invalid input(s): GFRCGP ------------------------------------------------------------------------------------------------------------------  Cardiac Enzymes No results for input(s): TROPONINI in  the last 168 hours. ------------------------------------------------------------  RADIOLOGY:  No results found.     Thank  you for the consultation and for allowing [facility] to assist in the care of your patient. Our recommendations are noted above.  Please contact us if we can be of further service.   Marda Stalker, MD.  Board Certified in Internal Medicine, Pulmonary Medicine, [place], and Sleep Medicine.  [facility] Office Number: [phone]  Patricia Pesa, M.D.  Vilinda Boehringer, M.D.  Merton Border, M.D  05/17/2016

## 2016-05-17 NOTE — Patient Instructions (Addendum)
--  Do not smoke before surgery.   --If you are having bronchitis or chest cold, you should postpone your surgery.   --If you having trouble with cravings and you would like to try chantix you may call us for a prescription.   --You are otherwise cleared for surgery from respiratory standpoint.   --Call us if you are having trouble with CPAP or need new supplies.

## 2016-05-19 ENCOUNTER — Ambulatory Visit (INDEPENDENT_AMBULATORY_CARE_PROVIDER_SITE_OTHER): Payer: 59 | Admitting: Family Medicine

## 2016-05-19 ENCOUNTER — Encounter: Payer: Self-pay | Admitting: Family Medicine

## 2016-05-19 VITALS — BP 116/68 | HR 82 | Temp 99.1°F | Ht 64.0 in | Wt 296.5 lb

## 2016-05-19 DIAGNOSIS — E781 Pure hyperglyceridemia: Secondary | ICD-10-CM

## 2016-05-19 DIAGNOSIS — D72829 Elevated white blood cell count, unspecified: Secondary | ICD-10-CM | POA: Diagnosis not present

## 2016-05-19 DIAGNOSIS — Z8781 Personal history of (healed) traumatic fracture: Secondary | ICD-10-CM

## 2016-05-19 DIAGNOSIS — Z23 Encounter for immunization: Secondary | ICD-10-CM | POA: Diagnosis not present

## 2016-05-19 DIAGNOSIS — R7303 Prediabetes: Secondary | ICD-10-CM | POA: Diagnosis not present

## 2016-05-19 DIAGNOSIS — Z6841 Body Mass Index (BMI) 40.0 and over, adult: Secondary | ICD-10-CM

## 2016-05-19 LAB — POCT GLYCOSYLATED HEMOGLOBIN (HGB A1C): Hemoglobin A1C: 6.3

## 2016-05-19 NOTE — Progress Notes (Signed)
Name: Dana Whitaker   MRN: [id number]    DOB: 08-18-1975   Date:05/19/2016       Progress Note  Subjective  Chief Complaint  Chief Complaint  Patient presents with  . Follow-up    HPI  Pt. Presents for follow up, she was seen 3 months ago and started on Bupropion to quit smoking. She states that she only took it for first 2 weeks or so and ended up quitting on her own. She has been quit for last 10 days.  She is preparing for bariatric surgery and obtaining preoperative clearance in this regard. She also wants a flu shot.   Past Medical History:  Diagnosis Date  . ADHD (attention deficit hyperactivity disorder)   . Anxiety   . Bipolar disorder ([place])   . Breast pain   . Depression   . Diabetes mellitus, type II ([place])   . Eczema   . Fatigue   . Fibromyalgia   . Headache   . Obesity   . Postcoital bleeding   . Sleep apnea   . Uterine cancer [facility])     Past Surgical History:  Procedure Laterality Date  . ABDOMINAL HYSTERECTOMY     tah.bso  . CARPAL TUNNEL RELEASE    . LEEP    . TONSILLECTOMY    . VARICOSE VEIN SURGERY      Family History  Problem Relation Age of Onset  . Hyperlipidemia Mother   . Bipolar disorder Mother   . Heart attack Father   . Drug abuse Father   . Anxiety disorder Sister   . Depression Sister   . Anxiety disorder Sister   . Depression Sister   . ADD / ADHD Sister   . Alcohol abuse Sister   . Thyroid disease Maternal Grandmother   . Diabetes Maternal Grandmother   . Stroke Maternal Grandmother   . Ovarian cancer Neg Hx   . Breast cancer Neg Hx   . Colon cancer Neg Hx     Social History   Social History  . Marital status: Married    Spouse name: N/A  . Number of children: N/A  . Years of education: N/A   Occupational History  . Not on file.   Social History Main Topics  . Smoking status: Former Smoker    Packs/day: 1.00    Years: 23.00    Types: Cigarettes    Start date: 02/05/1994    Quit date: 05/10/2016  . Smokeless  tobacco: Never Used  . Alcohol use 0.0 oz/week     Comment: occas  . Drug use: No  . Sexual activity: Yes    Birth control/ protection: None   Other Topics Concern  . Not on file   Social History Narrative  . No narrative on file    No current outpatient prescriptions on file.  Allergies  Allergen Reactions  . Ciprofloxacin   . Tramadol Rash     ROS  Please see history of present illness for ROS  Objective  Vitals:   05/19/16 1320  BP: 116/68  Pulse: 82  Temp: 99.1 F (37.3 C)  SpO2: 98%  Weight: 296 lb 8 oz (134.5 kg)  Height: 5\' 4"  (1.626 m)    Physical Exam  Constitutional: She is oriented to person, place, and time and well-developed, well-nourished, and in no distress.  HENT:  Head: Normocephalic and atraumatic.  Cardiovascular: Normal rate, regular rhythm and normal heart sounds.   No murmur heard. Pulmonary/Chest: Effort normal and breath sounds  normal. She has no wheezes.  Abdominal: Soft. Bowel sounds are normal. There is no tenderness.  Musculoskeletal:       Left foot: There is tenderness. There is no swelling and no deformity.       Feet:  Neurological: She is alert and oriented to person, place, and time.  Psychiatric: Mood, memory, affect and judgment normal.  Nursing note and vitals reviewed.      Assessment & Plan  1. Morbid obesity with BMI of 50.0-59.9, adult [facility]) Patient being followed by bariatric specialist in preparation for possible gastric bypass  2. Leukocytosis, unspecified type  - CBC with Differential  3. Prediabetes Point-of-care A1c 6.3%, which is consistent with prediabetes - POCT HgB A1C  4. Hypertriglyceridemia  - Lipid Profile  5. History of fracture of left ankle Pain in the left ankle, history of fracture of the talus. Referred to Orthopedics. - Ambulatory referral to Orthopedic Surgery  6. Need for influenza vaccination  - Flu Vaccine QUAD 36+ mos PF IM (Fluarix & Fluzone Quad PF)  Merline Perkin Asad A.  [facility] 05/19/2016 1:42 PM

## 2016-05-30 ENCOUNTER — Encounter: Payer: 59 | Attending: General Surgery | Admitting: Dietician

## 2016-05-30 DIAGNOSIS — Z720 Tobacco use: Secondary | ICD-10-CM | POA: Diagnosis not present

## 2016-05-30 DIAGNOSIS — Z6841 Body Mass Index (BMI) 40.0 and over, adult: Secondary | ICD-10-CM | POA: Diagnosis not present

## 2016-05-31 ENCOUNTER — Encounter: Payer: Self-pay | Admitting: Dietician

## 2016-05-31 NOTE — Progress Notes (Signed)
  Pre-Operative Nutrition Class:  Appt start time: 2902   End time:  1830.  Patient was seen on 05/30/2016 for Pre-Operative Bariatric Surgery [facility].   Surgery date:  Surgery type: Sleeve gastrectomy Start weight at [facility]: 286 lbs on 04/14/16 Weight today: 298 lbs  TANITA  BODY COMP RESULTS  05/30/16   BMI (kg/m^2) 51.2   Fat Mass (lbs) 162.4   Fat Free Mass (lbs) 135.6   Total Body Water (lbs) 101.4   Samples given per MNT protocol. Patient educated on appropriate usage: Bariatric Advantage Multivitamin (mixed fruit - qty 1) Lot #: [id number] Exp: 05/2017  Celebrate Vitamins Calcium Citrate chew (berry - qty 1) Lot #: [id number] Exp: 09/2017  Renee Pain Protein Powder (unflavored - qty 1) Lot #: [id number] Exp: 09/2017  Premier protein shake (vanilla - qty 1) Lot#: [id number] Exp: 02/2017  The following the learning objectives were met by the patient during this course:  Identify Pre-Op Dietary Goals and will begin 2 weeks pre-operatively  Identify appropriate sources of fluids and proteins   State protein recommendations and appropriate sources pre and post-operatively  Identify Post-Operative Dietary Goals and will follow for 2 weeks post-operatively  Identify appropriate multivitamin and calcium sources  Describe the need for physical activity post-operatively and will follow MD recommendations  State when to call healthcare provider regarding medication questions or post-operative complications  Handouts given during class include:  Pre-Op Bariatric Surgery Diet Handout  Protein Shake Handout  Post-Op Bariatric Surgery Nutrition Handout  BELT Program Information Flyer  Support Group Information Flyer  [facility] Bariatric Supplements Price List  Follow-Up Plan: Patient will follow-up at [facility] 2 weeks post operatively for diet advancement per MD.

## 2016-06-03 NOTE — Progress Notes (Signed)
Surgery on 06/13/2016.  preop on 06/09/16.  Need orders in EPIc  Thank You

## 2016-06-07 NOTE — Progress Notes (Signed)
Need SURGICAL ORDERS PLACED IN EPIC PLEASE- has pre op 06/09/16   thanks

## 2016-06-08 ENCOUNTER — Inpatient Hospital Stay: Payer: 59

## 2016-06-09 ENCOUNTER — Encounter (HOSPITAL_COMMUNITY): Payer: Self-pay

## 2016-06-09 ENCOUNTER — Ambulatory Visit: Payer: Self-pay | Admitting: General Surgery

## 2016-06-09 ENCOUNTER — Encounter (HOSPITAL_COMMUNITY)
Admission: RE | Admit: 2016-06-09 | Discharge: 2016-06-09 | Disposition: A | Discharge status: Home / Self Care | Payer: 59 | Source: Ambulatory Visit | Attending: General Surgery | Admitting: General Surgery

## 2016-06-09 DIAGNOSIS — Z01812 Encounter for preprocedural laboratory examination: Secondary | ICD-10-CM | POA: Insufficient documentation

## 2016-06-09 HISTORY — DX: Personal history of other diseases of the musculoskeletal system and connective tissue: Z87.39

## 2016-06-09 HISTORY — DX: Asymptomatic varicose veins of unspecified lower extremity: I83.90

## 2016-06-09 HISTORY — DX: Prediabetes: R73.03

## 2016-06-09 HISTORY — DX: Repeated falls: R29.6

## 2016-06-09 HISTORY — DX: Unspecified fall, initial encounter: W19.XXXA

## 2016-06-09 HISTORY — DX: Dysplasia of vagina, unspecified: N89.3

## 2016-06-09 HISTORY — DX: Gastro-esophageal reflux disease without esophagitis: K21.9

## 2016-06-09 HISTORY — DX: Personal history of other (healed) physical injury and trauma: Z87.828

## 2016-06-09 HISTORY — DX: Personal history of other diseases of the respiratory system: Z87.09

## 2016-06-09 LAB — CBC
HCT: 40 % (ref 36.0–46.0)
HEMOGLOBIN: 13.3 g/dL (ref 12.0–15.0)
MCH: 30.4 pg (ref 26.0–34.0)
MCHC: 33.3 g/dL (ref 30.0–36.0)
MCV: 91.3 fL (ref 78.0–100.0)
PLATELETS: 262 10*3/uL (ref 150–400)
RBC: 4.38 MIL/uL (ref 3.87–5.11)
RDW: 14.4 % (ref 11.5–15.5)
WBC: 7.4 10*3/uL (ref 4.0–10.5)

## 2016-06-09 LAB — BASIC METABOLIC PANEL
ANION GAP: 7 (ref 5–15)
BUN: 19 mg/dL (ref 6–20)
CO2: 25 mmol/L (ref 22–32)
CREATININE: 0.75 mg/dL (ref 0.44–1.00)
Calcium: 9.3 mg/dL (ref 8.9–10.3)
Chloride: 105 mmol/L (ref 101–111)
Glucose, Bld: 97 mg/dL (ref 65–99)
Potassium: 4.1 mmol/L (ref 3.5–5.1)
SODIUM: 137 mmol/L (ref 135–145)

## 2016-06-09 NOTE — Patient Instructions (Signed)
Dana Whitaker  06/09/2016   Your procedure is scheduled on: Monday June 13, 2016  Report to [facility] Main  Entrance take [place]  elevators to 3rd floor to  [place] at 11:15 AM.  Call this number if you have problems the morning of surgery [phone]   Remember: ONLY 1 PERSON MAY GO WITH YOU TO SHORT STAY TO GET  READY MORNING OF [place].  Do not eat food After Midnight but may take clear liquids till 7:15 am day of surgery then nothing by mouth.      Take these medicines the morning of surgery: DO NOT TAKE ANY DIABETIC MEDICATIONS DAY OF YOUR SURGERY                               You may not have any metal on your body including hair pins and              piercings  Do not wear jewelry, make-up, lotions, powders or perfumes, deodorant             Do not wear nail polish.  Do not shave  48 hours prior to surgery.     Do not bring valuables to the hospital. [place].  Contacts, dentures or bridgework may not be worn into surgery.  Leave suitcase in the car. After surgery it may be brought to your room.    _____________________________________________________________________             [facility] - Preparing for Surgery Before surgery, you can play an important role.  Because skin is not sterile, your skin needs to be as free of germs as possible.  You can reduce the number of germs on your skin by washing with CHG (chlorahexidine gluconate) soap before surgery.  CHG is an antiseptic cleaner which kills germs and bonds with the skin to continue killing germs even after washing. Please DO NOT use if you have an allergy to CHG or antibacterial soaps.  If your skin becomes reddened/irritated stop using the CHG and inform your nurse when you arrive at Short Stay. Do not shave (including legs and underarms) for at least 48 hours prior to the first CHG shower.  You may shave your  face/neck. Please follow these instructions carefully:  1.  Shower with CHG Soap the night before surgery and the  morning of Surgery.  2.  If you choose to wash your hair, wash your hair first as usual with your  normal  shampoo.  3.  After you shampoo, rinse your hair and body thoroughly to remove the  shampoo.                           4.  Use CHG as you would any other liquid soap.  You can apply chg directly  to the skin and wash                       Gently with a scrungie or clean washcloth.  5.  Apply the CHG Soap to your body ONLY FROM THE NECK DOWN.   Do not use on face/ open  Wound or open sores. Avoid contact with eyes, ears mouth and genitals (private parts).                       Wash face,  Genitals (private parts) with your normal soap.             6.  Wash thoroughly, paying special attention to the area where your surgery  will be performed.  7.  Thoroughly rinse your body with warm water from the neck down.  8.  DO NOT shower/wash with your normal soap after using and rinsing off  the CHG Soap.                9.  Pat yourself dry with a clean towel.            10.  Wear clean pajamas.            11.  Place clean sheets on your bed the night of your first shower and do not  sleep with pets. Day of Surgery : Do not apply any lotions/deodorants the morning of surgery.  Please wear clean clothes to the hospital/surgery center.  FAILURE TO FOLLOW THESE INSTRUCTIONS MAY RESULT IN THE CANCELLATION OF YOUR SURGERY PATIENT SIGNATURE_________________________________  NURSE SIGNATURE__________________________________  ________________________________________________________________________    CLEAR LIQUID DIET   Foods Allowed                                                                     Foods Excluded  Coffee and tea, regular and decaf                             liquids that you cannot  Plain Jell-O in any flavor                                              see through such as: Fruit ices (not with fruit pulp)                                     milk, soups, orange juice  Iced Popsicles                                    All solid food Carbonated beverages, regular and diet                                    Cranberry, grape and apple juices Sports drinks like Gatorade Lightly seasoned clear broth or consume(fat free) Sugar, honey syrup  Sample Menu Breakfast                                Lunch  Supper Cranberry juice                    Beef broth                            Chicken broth Jell-O                                     Grape juice                           Apple juice Coffee or tea                        Jell-O                                      Popsicle                                                Coffee or tea                        Coffee or tea  _____________________________________________________________________

## 2016-06-09 NOTE — H&P (Signed)
Dana Whitaker. [name] 06/09/2016 2:27 PM Location: [facility] Patient #: [id number] DOB: 1975-09-04 Married / Language: English / Race: White Female  History of Present Illness Randall Hiss M. Rawlin Reaume MD; 06/09/2016 3:21 PM) The patient is a 40 year old female who presents for a pre-op visit. She comes in today for her preoperative visit. She has been scheduled and approved for laparoscopic sleeve gastrectomy. I initially met her in July of this year. Her weight at that time was 289 pounds with a BMI of 50.5. Since that time she has stopped smoking. She states her last cigarette was on November 7. A urine nicotine test confirmed this. Otherwise she denies any medical changes since she was initially seen. She denies any chest pain, chest pressure, shows of breath, orthopnea, paroxysmal nocturnal dyspnea. She has some occasional infrequent reflux. She still gets occasional steroid injections for carpal tunnel.  I reviewed her upper endoscopy from 2016 which showed some mild gastritis and esophagitis but no evidence of a hiatal hernia. H. pylori biopsy was negative. Upper GI imaging was unremarkable along with her chest x-ray. Bariatric evaluation labs were unremarkable except for hemoglobin A1c of 6.2 and a triglyceride level of 163.   Problem List/Past Medical Randall Hiss Ronnie Derby, MD; 06/09/2016 3:21 PM) GASTROESOPHAGEAL REFLUX DISEASE, ESOPHAGITIS PRESENCE NOT SPECIFIED (K21.9) very mild, no meds, no reflux on UGI MORBID OBESITY WITH BMI OF 50.0-59.9, ADULT (E66.01) FATTY LIVER (K76.0) OSA (OBSTRUCTIVE SLEEP APNEA) (G47.33) CARPAL TUNNEL SYNDROME, BILATERAL (G56.03) HYPERTRIGLYCERIDEMIA (E78.1) Mildly elevated at 163 TOBACCO USE (Z72.0) PREDIABETES (R73.03) Preoperative A1c was 6.2  Other Problems [name], MD; 06/09/2016 3:21 PM) Vascular Disease Sleep Apnea Gastroesophageal Reflux Disease Depression Migraine Headache Other disease, cancer, significant  illness Oophorectomy Bilateral. Anxiety Disorder Cancer Back Pain  Past Surgical History [name], MD; 06/09/2016 3:21 PM) Cesarean Section - Multiple Tonsillectomy Hysterectomy (due to cancer) - Complete  Diagnostic Studies History [name], MD; 06/09/2016 3:21 PM) Pap Smear 1-5 years ago Mammogram >3 years ago  Allergies Nance Pear, CMA; 06/09/2016 2:27 PM) TraMADol HCl *ANALGESICS - OPIOID* Hives. Ciprofloxacin HCl *CHEMICALS* Rash.  Medication History [name], MD; 06/09/2016 3:21 PM) Medications Reconciled Ondansetron (4MG Tablet Disint, 1 (one) Tablet Oral every six hours, as needed, Taken starting 06/09/2016) Active. No Current Medications (Taken starting 06/09/2016) Protonix (40MG Tablet DR, 1 (one) Tablet Oral daily, Taken starting 06/09/2016) Active. OxyCODONE HCl (5MG/5ML Solution, 5-10 Milliliter Oral every four hours, as needed, Taken starting 06/09/2016) Active. Multivitamin Adult (1 (one) Tablet Chewable Oral two times daily, Taken starting 06/09/2016) Active. Calcium Citrate Chewy Bite (500-500MG-UNIT Tablet Chewable, 1 (one) Tablet Chewable Oral three times daily, Taken starting 06/09/2016) Active.  Social History [name], MD; 06/09/2016 3:21 PM) Alcohol use Occasional alcohol use. Caffeine use Carbonated beverages, Coffee. Tobacco use Current every day smoker. No drug use  Family History [name], MD; 06/09/2016 3:21 PM) Bleeding disorder Sister. Arthritis Mother. Cerebrovascular Accident Family Members In General. Diabetes Mellitus Family Members In General. Depression Mother, Sister, Son. Thyroid problems Family Members In General. Hypertension Mother.  Pregnancy / Birth History [name], MD; 06/09/2016 3:21 PM) Age of menopause <45 Age at menarche 46 years. Gravida 1 Para 1 Maternal age 45-20     Review of Systems Randall Hiss M. Osiel Stick MD; 06/09/2016 3:19 PM) General Present-  Fatigue, Night Sweats and Weight Gain. Not Present- Appetite Loss, Chills, Fever and Weight Loss. Skin Not Present- Change in Wart/Mole, Dryness, Hives, Jaundice, New Lesions, Non-Healing Wounds,  Rash and Ulcer. HEENT Present- Seasonal Allergies and Sore Throat. Not Present- Earache, Hearing Loss, Hoarseness, Nose Bleed, Oral Ulcers, Ringing in the Ears, Sinus Pain, Visual Disturbances, Wears glasses/contact lenses and Yellow Eyes. Respiratory Present- Chronic Cough, Difficulty Breathing and Snoring. Not Present- Bloody sputum and Wheezing. Breast Not Present- Breast Mass, Breast Pain, Nipple Discharge and Skin Changes. Cardiovascular Present- Leg Cramps, Shortness of Breath and Swelling of Extremities. Not Present- Chest Pain, Difficulty Breathing Lying Down, Palpitations and Rapid Heart Rate. Gastrointestinal Present- Difficulty Swallowing and Excessive gas. Not Present- Abdominal Pain, Bloating, Bloody Stool, Change in Bowel Habits, Chronic diarrhea, Constipation, Gets full quickly at meals, Hemorrhoids, Indigestion, Nausea, Rectal Pain and Vomiting. Female Genitourinary Present- Frequency and Urgency. Not Present- Nocturia, Painful Urination and Pelvic Pain. Musculoskeletal Present- Back Pain, Joint Pain, Joint Stiffness, Muscle Pain, Muscle Weakness and Swelling of Extremities. Neurological Present- Headaches and Numbness. Not Present- Decreased Memory, Fainting, Seizures, Tingling, Tremor, Trouble walking and Weakness. Psychiatric Present- Anxiety, Change in Sleep Pattern and Depression. Not Present- Bipolar, Fearful and Frequent crying. Endocrine Present- Excessive Hunger and Hot flashes. Not Present- Cold Intolerance, Hair Changes, Heat Intolerance and New Diabetes. Hematology Not Present- Blood Thinners, Easy Bruising, Excessive bleeding, Gland problems, HIV and Persistent Infections.  Vitals [name] CMA; 06/09/2016 2:29 PM) 06/09/2016 2:28 PM Weight: 294 lb Height: 63.5in Body  Surface Area: 2.29 m Body Mass Index: 51.26 kg/m  Temp.: 98.67F  Pulse: 70 (Regular)  BP: 124/82 (Sitting, Left Arm, Standard)      Physical Exam Randall Hiss M. Khristin Keleher MD; 06/09/2016 3:19 PM)  General Mental Status-Alert. General Appearance-Consistent with stated age. Hydration-Well hydrated. Voice-Normal. Note: Morbid obesity, apple-shaped  Head and Neck Head-normocephalic, atraumatic with no lesions or palpable masses. Trachea-midline. Thyroid Gland Characteristics - normal size and consistency.  Eye Eyeball - Bilateral-Extraocular movements intact. Sclera/Conjunctiva - Bilateral-No scleral icterus.  Chest and Lung Exam Chest and lung exam reveals -quiet, even and easy respiratory effort with no use of accessory muscles and on auscultation, normal breath sounds, no adventitious sounds and normal vocal resonance. Inspection Chest Wall - Normal. Back - normal.  Breast - Did not examine.  Cardiovascular Cardiovascular examination reveals -normal heart sounds, regular rate and rhythm with no murmurs and normal pedal pulses bilaterally.  Abdomen Inspection Inspection of the abdomen reveals - No Hernias. Skin - Scar - Note: Well-healed trocar scars. Palpation/Percussion Palpation and Percussion of the abdomen reveal - Soft, Non Tender, No Rebound tenderness, No Rigidity (guarding) and No hepatosplenomegaly. Auscultation Auscultation of the abdomen reveals - Bowel sounds normal.  Peripheral Vascular Upper Extremity Palpation - Pulses bilaterally normal.  Neurologic Neurologic evaluation reveals -alert and oriented x 3 with no impairment of recent or remote memory. Mental Status-Normal.  Neuropsychiatric The patient's mood and affect are described as -normal. Judgment and Insight-insight is appropriate concerning matters relevant to self.  Musculoskeletal Normal Exam - Left-Upper Extremity Strength Normal and Lower Extremity  Strength Normal. Normal Exam - Right-Upper Extremity Strength Normal and Lower Extremity Strength Normal.  Lymphatic Head & Neck  General Head & Neck Lymphatics: Bilateral - Description - Normal. Axillary - Did not examine. Femoral & Inguinal - Did not examine.    Assessment & Plan Randall Hiss M. Lake Breeding MD; 06/09/2016 3:19 PM)  MORBID OBESITY WITH BMI OF 50.0-59.9, ADULT (E66.01) Impression: We reviewed her preoperative workup. I congratulated her on stopping smoking. Her urine nicotine test was negative. We discussed the typical hospital and postoperative recovery. She was given prescriptions for postoperative pain, heartburn and nausea  prescriptions today. She also requested formal prescriptions for her vitamins and supplements since she states that insurance will cover that. All of her questions were asked and answered.  Current Plans Started Ondansetron 4MG, 1 (one) Tablet every six hours, as needed, #15, 06/09/2016, No Refill. Started Protonix 40MG, 1 (one) Tablet daily, #30, 30 days starting 06/09/2016, No Refill. Started OxyCODONE HCl 5MG/5ML, 5-10 Milliliter every four hours, as needed, 100 Milliliter, 06/09/2016, No Refill. Started Multivitamin Adult, 1 (one) Tablet Chewable two times daily, #60, 06/09/2016, Ref. x12. Started Calcium Citrate Chewy Bite 500-500MG-UNIT, 1 (one) Tablet Chewable three times daily, #90, 06/09/2016, Ref. x12. Pt Education - EMW_preopbariatric GASTROESOPHAGEAL REFLUX DISEASE, ESOPHAGITIS PRESENCE NOT SPECIFIED (K21.9) Story: very mild, no meds, no reflux on UGI  CARPAL TUNNEL SYNDROME, BILATERAL (G56.03)  FATTY LIVER (K76.0)  PREDIABETES (R73.03) Story: Preoperative A1c was 6.2  TOBACCO USE (Z72.0) Impression: Resolve-urine nicotine test negative. Can gradually did her on stopping smoking  HYPERTRIGLYCERIDEMIA (E78.1) Story: Mildly elevated at [address] Redmond Pulling, MD, FACS General, Bariatric, & Minimally Invasive Surgery [facility]  Surgery, [place]

## 2016-06-09 NOTE — Progress Notes (Signed)
ECHO report per chart 10/07/2014 Stress report per chart 10/07/2014

## 2016-06-09 NOTE — Progress Notes (Signed)
Please place surgical orders in epic. Pt had preop appt 06/09/2016. Thanks.

## 2016-06-13 ENCOUNTER — Inpatient Hospital Stay (HOSPITAL_COMMUNITY): Payer: 59 | Admitting: Certified Registered Nurse Anesthetist

## 2016-06-13 ENCOUNTER — Encounter (HOSPITAL_COMMUNITY): Payer: Self-pay | Admitting: *Deleted

## 2016-06-13 ENCOUNTER — Inpatient Hospital Stay (HOSPITAL_COMMUNITY)
Admission: RE | Admit: 2016-06-13 | Discharge: 2016-06-14 | DRG: 621 | Disposition: A | Discharge status: Home / Self Care | Payer: 59 | Source: Ambulatory Visit | Attending: General Surgery | Admitting: General Surgery

## 2016-06-13 ENCOUNTER — Encounter (HOSPITAL_COMMUNITY)
Admission: RE | Disposition: A | Discharge status: Home / Self Care | Payer: Self-pay | Source: Ambulatory Visit | Attending: General Surgery

## 2016-06-13 DIAGNOSIS — G5603 Carpal tunnel syndrome, bilateral upper limbs: Secondary | ICD-10-CM | POA: Diagnosis present

## 2016-06-13 DIAGNOSIS — Z6841 Body Mass Index (BMI) 40.0 and over, adult: Secondary | ICD-10-CM | POA: Diagnosis not present

## 2016-06-13 DIAGNOSIS — Z9884 Bariatric surgery status: Secondary | ICD-10-CM

## 2016-06-13 DIAGNOSIS — E781 Pure hyperglyceridemia: Secondary | ICD-10-CM | POA: Diagnosis present

## 2016-06-13 DIAGNOSIS — K210 Gastro-esophageal reflux disease with esophagitis: Secondary | ICD-10-CM | POA: Diagnosis present

## 2016-06-13 DIAGNOSIS — Z823 Family history of stroke: Secondary | ICD-10-CM | POA: Diagnosis not present

## 2016-06-13 DIAGNOSIS — Z9889 Other specified postprocedural states: Secondary | ICD-10-CM | POA: Diagnosis not present

## 2016-06-13 DIAGNOSIS — K449 Diaphragmatic hernia without obstruction or gangrene: Secondary | ICD-10-CM | POA: Diagnosis present

## 2016-06-13 DIAGNOSIS — R7303 Prediabetes: Secondary | ICD-10-CM | POA: Diagnosis present

## 2016-06-13 DIAGNOSIS — Z833 Family history of diabetes mellitus: Secondary | ICD-10-CM

## 2016-06-13 DIAGNOSIS — Z90710 Acquired absence of both cervix and uterus: Secondary | ICD-10-CM

## 2016-06-13 DIAGNOSIS — Z8249 Family history of ischemic heart disease and other diseases of the circulatory system: Secondary | ICD-10-CM | POA: Diagnosis not present

## 2016-06-13 DIAGNOSIS — G4730 Sleep apnea, unspecified: Secondary | ICD-10-CM | POA: Diagnosis present

## 2016-06-13 DIAGNOSIS — Z8261 Family history of arthritis: Secondary | ICD-10-CM | POA: Diagnosis not present

## 2016-06-13 DIAGNOSIS — K760 Fatty (change of) liver, not elsewhere classified: Secondary | ICD-10-CM | POA: Diagnosis present

## 2016-06-13 HISTORY — PX: LAPAROSCOPIC GASTRIC SLEEVE RESECTION: SHX5895

## 2016-06-13 LAB — HEPATIC FUNCTION PANEL
ALBUMIN: 4.3 g/dL (ref 3.5–5.0)
ALK PHOS: 74 U/L (ref 38–126)
ALT: 22 U/L (ref 14–54)
AST: 20 U/L (ref 15–41)
BILIRUBIN TOTAL: 0.7 mg/dL (ref 0.3–1.2)
Bilirubin, Direct: 0.1 mg/dL (ref 0.1–0.5)
Indirect Bilirubin: 0.6 mg/dL (ref 0.3–0.9)
TOTAL PROTEIN: 7.8 g/dL (ref 6.5–8.1)

## 2016-06-13 LAB — HEMOGLOBIN AND HEMATOCRIT, BLOOD
HEMATOCRIT: 37.1 % (ref 36.0–46.0)
Hemoglobin: 12.2 g/dL (ref 12.0–15.0)

## 2016-06-13 LAB — GLUCOSE, CAPILLARY: Glucose-Capillary: 91 mg/dL (ref 65–99)

## 2016-06-13 SURGERY — GASTRECTOMY, SLEEVE, LAPAROSCOPIC
Anesthesia: General | Site: Abdomen

## 2016-06-13 MED ORDER — DIPHENHYDRAMINE HCL 50 MG/ML IJ SOLN: 12.5000 mg | Freq: Three times a day (TID) | INTRAMUSCULAR | Status: DC | PRN

## 2016-06-13 MED ORDER — MORPHINE SULFATE (PF) 2 MG/ML IV SOLN
2.0000 mg | INTRAVENOUS | Status: DC | PRN
  Administered 2016-06-13: 2 mg via INTRAVENOUS
  Filled 2016-06-13: qty 1

## 2016-06-13 MED ORDER — MIDAZOLAM HCL 5 MG/5ML IJ SOLN
INTRAMUSCULAR | Status: DC | PRN
  Administered 2016-06-13: 2 mg via INTRAVENOUS

## 2016-06-13 MED ORDER — BUPIVACAINE LIPOSOME 1.3 % IJ SUSP
20.0000 mL | Freq: Once | INTRAMUSCULAR | Status: AC
  Administered 2016-06-13: 20 mL
  Filled 2016-06-13: qty 20

## 2016-06-13 MED ORDER — SCOPOLAMINE 1 MG/3DAYS TD PT72
MEDICATED_PATCH | TRANSDERMAL | Status: AC
  Filled 2016-06-13: qty 1

## 2016-06-13 MED ORDER — FENTANYL CITRATE (PF) 250 MCG/5ML IJ SOLN
INTRAMUSCULAR | Status: AC
  Filled 2016-06-13: qty 5

## 2016-06-13 MED ORDER — GABAPENTIN 300 MG PO CAPS
300.0000 mg | ORAL_CAPSULE | Freq: Once | ORAL | Status: AC
  Administered 2016-06-13: 300 mg via ORAL
  Filled 2016-06-13: qty 1

## 2016-06-13 MED ORDER — ACETAMINOPHEN 160 MG/5ML PO SOLN: 325.0000 mg | ORAL | Status: DC | PRN

## 2016-06-13 MED ORDER — ONDANSETRON HCL 4 MG/2ML IJ SOLN: 4.0000 mg | INTRAMUSCULAR | Status: DC | PRN

## 2016-06-13 MED ORDER — SUGAMMADEX SODIUM 500 MG/5ML IV SOLN
INTRAVENOUS | Status: DC | PRN
  Administered 2016-06-13: 500 mg via INTRAVENOUS

## 2016-06-13 MED ORDER — LIDOCAINE 2% (20 MG/ML) 5 ML SYRINGE
INTRAMUSCULAR | Status: DC | PRN
  Administered 2016-06-13: 100 mg via INTRAVENOUS

## 2016-06-13 MED ORDER — ROCURONIUM BROMIDE 50 MG/5ML IV SOSY
PREFILLED_SYRINGE | INTRAVENOUS | Status: DC | PRN
  Administered 2016-06-13: 50 mg via INTRAVENOUS
  Administered 2016-06-13 (×2): 10 mg via INTRAVENOUS

## 2016-06-13 MED ORDER — SUGAMMADEX SODIUM 500 MG/5ML IV SOLN
INTRAVENOUS | Status: AC
  Filled 2016-06-13: qty 5

## 2016-06-13 MED ORDER — APREPITANT 80 MG PO CAPS
80.0000 mg | ORAL_CAPSULE | ORAL | Status: AC
  Administered 2016-06-13: 80 mg via ORAL
  Filled 2016-06-13: qty 1

## 2016-06-13 MED ORDER — ACETAMINOPHEN 160 MG/5ML PO SOLN: 650.0000 mg | ORAL | Status: DC | PRN

## 2016-06-13 MED ORDER — SODIUM CHLORIDE 0.9 % IJ SOLN
INTRAMUSCULAR | Status: AC
  Filled 2016-06-13: qty 50

## 2016-06-13 MED ORDER — 0.9 % SODIUM CHLORIDE (POUR BTL) OPTIME
TOPICAL | Status: DC | PRN
  Administered 2016-06-13: 1000 mL

## 2016-06-13 MED ORDER — SUCCINYLCHOLINE CHLORIDE 200 MG/10ML IV SOSY
PREFILLED_SYRINGE | INTRAVENOUS | Status: DC | PRN
  Administered 2016-06-13: 120 mg via INTRAVENOUS

## 2016-06-13 MED ORDER — HYDROMORPHONE HCL 1 MG/ML IJ SOLN
INTRAMUSCULAR | Status: AC
  Administered 2016-06-13: 0.5 mg via INTRAVENOUS
  Filled 2016-06-13: qty 0.5

## 2016-06-13 MED ORDER — KETOROLAC TROMETHAMINE 30 MG/ML IJ SOLN
INTRAMUSCULAR | Status: DC | PRN
  Administered 2016-06-13: 30 mg via INTRAVENOUS

## 2016-06-13 MED ORDER — OXYCODONE HCL 5 MG/5ML PO SOLN
5.0000 mg | ORAL | Status: DC | PRN
  Filled 2016-06-13: qty 5

## 2016-06-13 MED ORDER — KETAMINE HCL 10 MG/ML IJ SOLN
INTRAMUSCULAR | Status: DC | PRN
  Administered 2016-06-13: 10 mg via INTRAVENOUS
  Administered 2016-06-13: 50 mg via INTRAVENOUS

## 2016-06-13 MED ORDER — SODIUM CHLORIDE 0.9 % IJ SOLN
INTRAMUSCULAR | Status: DC | PRN
  Administered 2016-06-13: 50 mL

## 2016-06-13 MED ORDER — ONDANSETRON HCL 4 MG/2ML IJ SOLN
INTRAMUSCULAR | Status: AC
  Filled 2016-06-13: qty 2

## 2016-06-13 MED ORDER — ACETAMINOPHEN 160 MG/5ML PO SOLN
1000.0000 mg | Freq: Four times a day (QID) | ORAL | Status: DC
  Administered 2016-06-14 (×2): 1000 mg via ORAL
  Filled 2016-06-13 (×2): qty 40.6

## 2016-06-13 MED ORDER — LACTATED RINGERS IV SOLN
INTRAVENOUS | Status: DC | PRN
  Administered 2016-06-13 (×2): via INTRAVENOUS

## 2016-06-13 MED ORDER — CEFOTETAN DISODIUM-DEXTROSE 2-2.08 GM-% IV SOLR
2.0000 g | INTRAVENOUS | Status: AC
  Administered 2016-06-13: 2 g via INTRAVENOUS

## 2016-06-13 MED ORDER — HYDROMORPHONE HCL 1 MG/ML IJ SOLN
INTRAMUSCULAR | Status: AC
  Filled 2016-06-13: qty 0.5

## 2016-06-13 MED ORDER — STERILE WATER FOR IRRIGATION IR SOLN
Status: DC | PRN
  Administered 2016-06-13: 2000 mL

## 2016-06-13 MED ORDER — ACETAMINOPHEN 10 MG/ML IV SOLN
INTRAVENOUS | Status: DC | PRN
  Administered 2016-06-13: 1000 mg via INTRAVENOUS

## 2016-06-13 MED ORDER — FENTANYL CITRATE (PF) 250 MCG/5ML IJ SOLN
INTRAMUSCULAR | Status: DC | PRN
  Administered 2016-06-13 (×2): 25 ug via INTRAVENOUS

## 2016-06-13 MED ORDER — PANTOPRAZOLE SODIUM 40 MG IV SOLR
40.0000 mg | Freq: Every day | INTRAVENOUS | Status: DC
  Administered 2016-06-13: 40 mg via INTRAVENOUS
  Filled 2016-06-13: qty 40

## 2016-06-13 MED ORDER — PROPOFOL 10 MG/ML IV BOLUS
INTRAVENOUS | Status: AC
  Filled 2016-06-13: qty 20

## 2016-06-13 MED ORDER — KCL IN DEXTROSE-NACL 20-5-0.45 MEQ/L-%-% IV SOLN
INTRAVENOUS | Status: DC
  Administered 2016-06-13 – 2016-06-14 (×3): via INTRAVENOUS
  Filled 2016-06-13 (×4): qty 1000

## 2016-06-13 MED ORDER — PROMETHAZINE HCL 25 MG/ML IJ SOLN: 12.5000 mg | Freq: Four times a day (QID) | INTRAMUSCULAR | Status: DC | PRN

## 2016-06-13 MED ORDER — ENOXAPARIN SODIUM 30 MG/0.3ML ~~LOC~~ SOLN
30.0000 mg | Freq: Two times a day (BID) | SUBCUTANEOUS | Status: DC
  Administered 2016-06-14: 30 mg via SUBCUTANEOUS
  Filled 2016-06-13: qty 0.3

## 2016-06-13 MED ORDER — DEXAMETHASONE SODIUM PHOSPHATE 10 MG/ML IJ SOLN
INTRAMUSCULAR | Status: AC
  Filled 2016-06-13: qty 1

## 2016-06-13 MED ORDER — LACTATED RINGERS IR SOLN
Status: DC | PRN
  Administered 2016-06-13: 3000 mL

## 2016-06-13 MED ORDER — DEXAMETHASONE SODIUM PHOSPHATE 10 MG/ML IJ SOLN
INTRAMUSCULAR | Status: DC | PRN
  Administered 2016-06-13: 5 mg via INTRAVENOUS

## 2016-06-13 MED ORDER — PROPOFOL 10 MG/ML IV BOLUS
INTRAVENOUS | Status: DC | PRN
  Administered 2016-06-13: 160 mg via INTRAVENOUS

## 2016-06-13 MED ORDER — MIDAZOLAM HCL 2 MG/2ML IJ SOLN
INTRAMUSCULAR | Status: AC
  Filled 2016-06-13: qty 2

## 2016-06-13 MED ORDER — CEFOTETAN DISODIUM-DEXTROSE 2-2.08 GM-% IV SOLR
INTRAVENOUS | Status: AC
  Filled 2016-06-13: qty 50

## 2016-06-13 MED ORDER — ONDANSETRON HCL 4 MG/2ML IJ SOLN
INTRAMUSCULAR | Status: DC | PRN
  Administered 2016-06-13: 4 mg via INTRAVENOUS

## 2016-06-13 MED ORDER — ROCURONIUM BROMIDE 50 MG/5ML IV SOSY
PREFILLED_SYRINGE | INTRAVENOUS | Status: AC
  Filled 2016-06-13: qty 5

## 2016-06-13 MED ORDER — HEPARIN SODIUM (PORCINE) 5000 UNIT/ML IJ SOLN
5000.0000 [IU] | Freq: Once | INTRAMUSCULAR | Status: AC
  Administered 2016-06-13: 5000 [IU] via SUBCUTANEOUS
  Filled 2016-06-13: qty 1

## 2016-06-13 MED ORDER — LIDOCAINE 2% (20 MG/ML) 5 ML SYRINGE
INTRAMUSCULAR | Status: AC
  Filled 2016-06-13: qty 5

## 2016-06-13 MED ORDER — PREMIER PROTEIN SHAKE: 2.0000 [oz_av] | ORAL | Status: DC

## 2016-06-13 MED ORDER — HYDROMORPHONE HCL 1 MG/ML IJ SOLN
0.2500 mg | INTRAMUSCULAR | Status: DC | PRN
  Administered 2016-06-13 (×3): 0.5 mg via INTRAVENOUS

## 2016-06-13 MED ORDER — SCOPOLAMINE 1 MG/3DAYS TD PT72
MEDICATED_PATCH | TRANSDERMAL | Status: DC | PRN
  Administered 2016-06-13: 1 via TRANSDERMAL

## 2016-06-13 MED ORDER — PHENYLEPHRINE HCL 10 MG/ML IJ SOLN
INTRAMUSCULAR | Status: DC | PRN
  Administered 2016-06-13: 80 ug via INTRAVENOUS

## 2016-06-13 MED ORDER — ACETAMINOPHEN 10 MG/ML IV SOLN
INTRAVENOUS | Status: AC
  Filled 2016-06-13: qty 100

## 2016-06-13 MED ORDER — HEPARIN SODIUM (PORCINE) 5000 UNIT/ML IJ SOLN
5000.0000 [IU] | INTRAMUSCULAR | Status: AC
  Administered 2016-06-13: 5000 [IU] via SUBCUTANEOUS
  Filled 2016-06-13: qty 1

## 2016-06-13 SURGICAL SUPPLY — 67 items
APPLICATOR COTTON TIP 6IN STRL (MISCELLANEOUS) IMPLANT
APPLIER CLIP ROT 10 11.4 M/L (STAPLE)
APPLIER CLIP ROT 13.4 12 LRG (CLIP)
BLADE SURG SZ11 CARB STEEL (BLADE) ×3 IMPLANT
CABLE HIGH FREQUENCY MONO STRZ (ELECTRODE) ×3 IMPLANT
CHLORAPREP W/TINT 26ML (MISCELLANEOUS) ×6 IMPLANT
CLIP APPLIE ROT 10 11.4 M/L (STAPLE) IMPLANT
CLIP APPLIE ROT 13.4 12 LRG (CLIP) IMPLANT
COVER SURGICAL LIGHT HANDLE (MISCELLANEOUS) ×3 IMPLANT
DECANTER SPIKE VIAL GLASS SM (MISCELLANEOUS) ×3 IMPLANT
DERMABOND ADVANCED (GAUZE/BANDAGES/DRESSINGS) ×2
DERMABOND ADVANCED .7 DNX12 (GAUZE/BANDAGES/DRESSINGS) ×1 IMPLANT
DEVICE SUT QUICK LOAD TK 5 (STAPLE) ×2 IMPLANT
DEVICE SUT TI-KNOT TK 5X26 (MISCELLANEOUS) ×2 IMPLANT
DEVICE SUTURE ENDOST 10MM (ENDOMECHANICALS) ×3 IMPLANT
DEVICE TI KNOT TK5 (MISCELLANEOUS) ×1
DEVICE TROCAR PUNCTURE CLOSURE (ENDOMECHANICALS) IMPLANT
DRAPE UTILITY XL STRL (DRAPES) ×6 IMPLANT
ELECT L-HOOK LAP 45CM DISP (ELECTROSURGICAL)
ELECT PENCIL ROCKER SW 15FT (MISCELLANEOUS) IMPLANT
ELECT REM PT RETURN 9FT ADLT (ELECTROSURGICAL) ×3
ELECTRODE L-HOOK LAP 45CM DISP (ELECTROSURGICAL) IMPLANT
ELECTRODE REM PT RTRN 9FT ADLT (ELECTROSURGICAL) ×1 IMPLANT
GAUZE SPONGE 4X4 12PLY STRL (GAUZE/BANDAGES/DRESSINGS) IMPLANT
GLOVE BIO SURGEON STRL SZ7.5 (GLOVE) ×3 IMPLANT
GLOVE INDICATOR 8.0 STRL GRN (GLOVE) ×3 IMPLANT
GOWN STRL REUS W/TWL XL LVL3 (GOWN DISPOSABLE) ×9 IMPLANT
HOVERMATT SINGLE USE (MISCELLANEOUS) ×3 IMPLANT
IRRIG SUCT STRYKERFLOW 2 WTIP (MISCELLANEOUS) ×3
IRRIGATION SUCT STRKRFLW 2 WTP (MISCELLANEOUS) ×1 IMPLANT
KIT BASIN OR (CUSTOM PROCEDURE TRAY) ×3 IMPLANT
MARKER SKIN DUAL TIP RULER LAB (MISCELLANEOUS) ×3 IMPLANT
NEEDLE SPNL 22GX3.5 QUINCKE BK (NEEDLE) ×3 IMPLANT
PACK UNIVERSAL I (CUSTOM PROCEDURE TRAY) ×3 IMPLANT
QUICK LOAD TK 5 (STAPLE) ×1
RELOAD STAPLER BLUE 60MM (STAPLE) ×2 IMPLANT
RELOAD STAPLER GOLD 60MM (STAPLE) ×1 IMPLANT
RELOAD STAPLER GREEN 60MM (STAPLE) ×2 IMPLANT
SCISSORS LAP 5X45 EPIX DISP (ENDOMECHANICALS) ×3 IMPLANT
SEALANT SURGICAL APPL DUAL CAN (MISCELLANEOUS) IMPLANT
SET IRRIG TUBING LAPAROSCOPIC (IRRIGATION / IRRIGATOR) ×3 IMPLANT
SHEARS HARMONIC ACE PLUS 45CM (MISCELLANEOUS) ×3 IMPLANT
SLEEVE ADV FIXATION 5X100MM (TROCAR) ×6 IMPLANT
SLEEVE GASTRECTOMY 40FR VISIGI (MISCELLANEOUS) ×3 IMPLANT
SOLUTION ANTI FOG 6CC (MISCELLANEOUS) ×3 IMPLANT
SPONGE LAP 18X18 X RAY DECT (DISPOSABLE) ×3 IMPLANT
STAPLER ECHELON BIOABSB 60 FLE (MISCELLANEOUS) ×9 IMPLANT
STAPLER ECHELON LONG 60 440 (INSTRUMENTS) IMPLANT
STAPLER RELOAD BLUE 60MM (STAPLE) ×6
STAPLER RELOAD GOLD 60MM (STAPLE) ×3
STAPLER RELOAD GREEN 60MM (STAPLE) ×6
SUT MNCRL AB 4-0 PS2 18 (SUTURE) ×3 IMPLANT
SUT SURGIDAC NAB ES-9 0 48 120 (SUTURE) IMPLANT
SUT VICRYL 0 TIES 12 18 (SUTURE) ×3 IMPLANT
SYR 10ML ECCENTRIC (SYRINGE) ×3 IMPLANT
SYR 20CC LL (SYRINGE) ×3 IMPLANT
SYR 50ML LL SCALE MARK (SYRINGE) ×3 IMPLANT
TOWEL OR 17X26 10 PK STRL BLUE (TOWEL DISPOSABLE) ×3 IMPLANT
TOWEL OR NON WOVEN STRL DISP B (DISPOSABLE) ×3 IMPLANT
TRAY FOLEY W/METER SILVER 16FR (SET/KITS/TRAYS/PACK) IMPLANT
TROCAR ADV FIXATION 5X100MM (TROCAR) ×3 IMPLANT
TROCAR BLADELESS 15MM (ENDOMECHANICALS) ×3 IMPLANT
TROCAR BLADELESS OPT 5 100 (ENDOMECHANICALS) ×3 IMPLANT
TUBING CONNECTING 10 (TUBING) ×4 IMPLANT
TUBING CONNECTING 10' (TUBING) ×2
TUBING ENDO SMARTCAP (MISCELLANEOUS) ×3 IMPLANT
TUBING INSUF HEATED (TUBING) ×3 IMPLANT

## 2016-06-13 NOTE — Discharge Instructions (Addendum)
[facility] Discharge Instructions  °On the day of surgery you were given the medication aprepitant. This medication interacts with hormonal forms of birth control (oral contraceptives and injected or implanted birth control) and may make them ineffective. °IF YOU USE ANY HORMONAL FORM OF BIRTH CONTROL, YOU MUST USE AN ADDITIONAL BARRIER BIRTH CONTROL METHOD FOR ONE MONTH after receiving aprepitant or there is a chance you could become pregnant. ° ° ° ° °GASTRIC BYPASS/SLEEVE ° Home Care Instructions ° ° These instructions are to help you care for yourself when you go home. ° °Call: If you have any problems. °• Call [phone] and ask for the surgeon on call °• If you need immediate assistance come to the ER at [facility]. Tell the ER staff you are a new post-op gastric bypass or gastric sleeve patient  °Signs and symptoms to report: • Severe  vomiting or nausea °o If you cannot handle clear liquids for longer than 1 day, call your surgeon °• Abdominal pain which does not get better after taking your pain medication °• Fever greater than 100.4°  F and chills °• Heart rate over 100 beats a minute °• Trouble breathing °• Chest pain °• Redness,  swelling, drainage, or foul odor at incision (surgical) sites °• If your incisions open or pull apart °• Swelling or pain in calf (lower leg) °• Diarrhea (Loose bowel movements that happen often), frequent watery, uncontrolled bowel movements °• Constipation, (no bowel movements for 3 days) if this happens: °o Take Milk of Magnesia, 2 tablespoons by mouth, 3 times a day for 2 days if needed °o Stop taking Milk of Magnesia once you have had a bowel movement °o Call your doctor if constipation continues °Or °o Take Miralax  (instead of Milk of Magnesia) following the label instructions °o Stop taking Miralax once you have had a bowel movement °o Call your doctor if constipation continues °• Anything you think is “abnormal for you” °  °Normal side effects after surgery: • Unable  to sleep at night or unable to concentrate °• Irritability °• Being tearful (crying) or depressed ° °These are common complaints, possibly related to your anesthesia, stress of surgery, and change in lifestyle, that usually go away a few weeks after surgery. If these feelings continue, call your medical doctor.  °Wound Care: You may have surgical glue, steri-strips, or staples over your incisions after surgery °• Surgical glue: Looks like clear film over your incisions and will wear off a little at a time °• Steri-strips: Adhesive strips of tape over your incisions. You may notice a yellowish color on skin under the steri-strips. This is used to make the steri-strips stick better. Do not pull the steri-strips off - let them fall off °• Staples: Staples may be removed before you leave the hospital °o If you go home with staples, call [facility] for an appointment with your surgeon’s nurse to have staples removed 10 days after surgery, [phone] °• Showering: You may shower two (2) days after your surgery unless your surgeon tells you differently °o Wash gently around incisions with warm soapy water, rinse well, and gently pat dry °o If you have a drain (tube from your incision), you may need someone to hold this while you shower °o No tub baths until staples are removed and incisions are healed °  °Medications: • Medications should be liquid or crushed if larger than the size of a dime °• Extended release pills (medication that releases a little bit at   a time through the  day) should not be crushed °• Depending on the size and number of medications you take, you may need to space (take a few throughout the day)/change the time you take your medications so that you do not over-fill your pouch (smaller stomach) °• Make sure you follow-up with you primary care physician to make medication changes needed during rapid weight loss and life -style changes °• If you have diabetes, follow up with your  doctor that orders your diabetes medication(s) within one week after surgery and check your blood sugar regularly ° °• Do not drive while taking narcotics (pain medications) ° °• Do not take acetaminophen (Tylenol) and Roxicet or Lortab Elixir at the same time since these pain medications contain acetaminophen °  °Diet:  °First 2 Weeks You will see the nutritionist about two (2) weeks after your surgery. The nutritionist will increase the types of foods you can eat if you are handling liquids well: °• If you have severe vomiting or nausea and cannot handle clear liquids lasting longer than 1 day call your surgeon °Protein Shake °• Drink at least 2 ounces of shake 5-6 times per day °• Each serving of protein shakes (usually 8-12 ounces) should have a minimum of: °o 15 grams of protein °o And no more than 5 grams of carbohydrate °• Goal for protein each day: °o Men = 80 grams per day °o Women = 60 grams per day °  ° • Protein powder may be added to fluids such as non-fat milk or Lactaid milk or Soy milk (limit to 35 grams added protein powder per serving) ° °Hydration °• Slowly increase the amount of water and other clear liquids as tolerated (See Acceptable Fluids) °• Slowly increase the amount of protein shake as tolerated °• Sip fluids slowly and throughout the day °• May use sugar substitutes in small amounts (no more than 6-8 packets per day; i.e. Splenda) ° °Fluid Goal °• The first goal is to drink at least 8 ounces of protein shake/drink per day (or as directed by the nutritionist); some examples of protein shakes are Syntrax Nectar, Adkins Advantage, EAS Edge HP, and Unjury. - See handout from pre-op Bariatric Education Class: °o Slowly increase the amount of protein shake you drink as tolerated °o You may find it easier to slowly sip shakes throughout the day °o It is important to get your proteins in first °• Your fluid goal is to drink 64-100 ounces of fluid daily °o It may take a few weeks to build up to  this  °• 32 oz. (or more) should be clear liquids °And °• 32 oz. (or more) should be full liquids (see below for examples) °• Liquids should not contain sugar, caffeine, or carbonation ° °Clear Liquids: °• Water of Sugar-free flavored water (i.e. Fruit H²O, Propel) °• Decaffeinated coffee or tea (sugar-free) °• Crystal lite, Wyler’s Lite, Minute Maid Lite °• Sugar-free Jell-O °• Bouillon or broth °• Sugar-free Popsicle:    - Less than 20 calories each; Limit 1 per day ° °Full Liquids: °                  Protein Shakes/Drinks + 2 choices per day of other full liquids °• Full liquids must be: °o No More Than 12 grams of Carbs per serving °o No More Than 3 grams of Fat per serving °• Strained low-fat cream soup °• Non-Fat milk °• Fat-free Lactaid Milk °• Sugar-free yogurt (Dannon Lite & Fit, Greek   yogurt) ° °  °Vitamins and Minerals • Start 1 day after surgery unless otherwise directed by your surgeon °• 2 Chewable Multivitamin / Multimineral Supplement with iron (i.e. Centrum for Adults) °• Vitamin B-12, 350-500 micrograms sub-lingual (place tablet under the tongue) each day °• Chewable Calcium Citrate with Vitamin D-3 °(Example: 3 Chewable Calcium  Plus 600 with Vitamin D-3) °o Take 500 mg three (3) times a day for a total of 1500 mg each day °o Do not take all 3 doses of calcium at one time as it may cause constipation, and you can only absorb 500 mg at a time °o Do not mix multivitamins containing iron with calcium supplements;  take 2 hours apart °o Do not substitute Tums (calcium carbonate) for your calcium °• Menstruating women and those at risk for anemia ( a blood disease that causes weakness) may need extra iron °o Talk to your doctor to see if you need more iron °• If you need extra iron: Total daily Iron recommendation (including Vitamins) is 50 to 100 mg Iron/day °• Do not stop taking or change any vitamins or minerals until you talk to your nutritionist or surgeon °• Your nutritionist and/or surgeon must  approve all vitamin and mineral supplements °  °Activity and Exercise: It is important to continue walking at home. Limit your physical activity as instructed by your doctor. During this time, use these guidelines: °• Do not lift anything greater than ten  (10) pounds for at least two (2) weeks °• Do not go back to work or drive until your surgeon says you can °• You may have sex when you feel comfortable °o It is VERY important for female patients to use a reliable birth control method; fertility often increase after surgery °o Do not get pregnant for at least 18 months °• Start exercising as soon as your doctor tells you that you can °o Make sure your doctor approves any physical activity °• Start with a simple walking program °• Walk 5-15 minutes each day, 7 days per week °• Slowly increase until you are walking 30-45 minutes per day °• Consider joining our BELT program. [phone] or email [email or web address] °  °Special Instructions Things to remember: °• Free counseling is available for you and your family through collaboration between [facility] and [employer]. Please call [phone] and leave a message °• Use your CPAP when sleeping if this applies to you °• Consider buying a medical alert bracelet that says you had lap-band surgery °  °  You will likely have your first fill (fluid added to your band) 6 - 8 weeks after surgery °• [facility] has a free Bariatric Surgery Support Group that meets monthly, the 3rd Thursday, 6pm. [facility] Classrooms. You can see classes online at [email or web address] °• It is very important to keep all follow up appointments with your surgeon, nutritionist, primary care physician, and behavioral health practitioner °o After the first year, please follow up with your bariatric surgeon and nutritionist at least once a year in order to maintain best weight loss results °      °             [facility]:  [phone] ° °             [facility]: [phone] ° °             Bariatric Nurse Coordinator: [phone]  °Gastric Bypass/Sleeve Home Care   Instructions  Rev. 08/2012    ° °                                                    Reviewed and Endorsed °                                                   by [facility] Patient Education Committee, Jan, 2014 ° ° ° ° ° ° ° ° ° °

## 2016-06-13 NOTE — Anesthesia Procedure Notes (Signed)
Procedure Name: Intubation Date/Time: 06/13/2016 1:07 PM Performed by: West Pugh Pre-anesthesia Checklist: Patient identified, Emergency Drugs available, Suction available, Patient being monitored and Timeout performed Patient Re-evaluated:Patient Re-evaluated prior to inductionOxygen Delivery Method: Circle system utilized Preoxygenation: Pre-oxygenation with 100% oxygen Intubation Type: IV induction Ventilation: Mask ventilation without difficulty Laryngoscope Size: Mac and 4 Grade View: Grade II Tube type: Oral Tube size: 7.5 mm Number of attempts: 1 Airway Equipment and Method: Stylet Placement Confirmation: ETT inserted through vocal cords under direct vision,  positive ETCO2,  CO2 detector and breath sounds checked- equal and bilateral Secured at: 23 cm Tube secured with: Tape Dental Injury: Teeth and Oropharynx as per pre-operative assessment

## 2016-06-13 NOTE — Transfer of Care (Signed)
Immediate Anesthesia Transfer of Care Note  Patient: Dana Whitaker  Procedure(s) Performed: Procedure(s): LAPAROSCOPIC GASTRIC SLEEVE RESECTION WITH HIATAL HERNIA REPAIR AND UPPER ENDOSCOPY (N/A)  Patient Location: PACU  Anesthesia Type:General  Level of Consciousness:  sedated, patient cooperative and responds to stimulation  Airway & Oxygen Therapy:Patient Spontanous Breathing and Patient connected to face mask oxgen  Post-op Assessment:  Report given to PACU RN and Post -op Vital signs reviewed and stable  Post vital signs:  Reviewed and stable  Last Vitals:  Vitals:   06/13/16 1140  BP: (!) 123/97  Pulse: 77  Resp: 18  Temp: [id number]    Complications: No apparent anesthesia complications

## 2016-06-13 NOTE — Anesthesia Preprocedure Evaluation (Addendum)
Anesthesia Evaluation  Patient identified by MRN, date of birth, ID band Patient awake    Reviewed: Allergy & Precautions, H&P , NPO status , Patient's Chart, lab work & pertinent test results  Airway Mallampati: III  TM Distance: >3 FB Neck ROM: Full    Dental no notable dental hx. (+) Teeth Intact, Dental Advisory Given   Pulmonary sleep apnea , former smoker,    Pulmonary exam normal breath sounds clear to auscultation       Cardiovascular negative cardio ROS   Rhythm:Regular Rate:Normal     Neuro/Psych  Headaches, Anxiety Depression Bipolar Disorder    GI/Hepatic Neg liver ROS, GERD  Controlled,  Endo/Other  Morbid obesity  Renal/GU negative Renal ROS  negative genitourinary   Musculoskeletal  (+) Fibromyalgia -  Abdominal   Peds  Hematology negative hematology ROS (+)   Anesthesia Other Findings   Reproductive/Obstetrics negative OB ROS                            Anesthesia Physical Anesthesia Plan  ASA: III  Anesthesia Plan: General   Post-op Pain Management:    Induction: Intravenous  Airway Management Planned: Oral ETT  Additional Equipment:   Intra-op Plan:   Post-operative Plan: Extubation in OR  Informed Consent: I have reviewed the patients History and Physical, chart, labs and discussed the procedure including the risks, benefits and alternatives for the proposed anesthesia with the patient or authorized representative who has indicated his/her understanding and acceptance.   Dental advisory given  Plan Discussed with: CRNA  Anesthesia Plan Comments:         Anesthesia Quick Evaluation

## 2016-06-13 NOTE — Op Note (Signed)
06/13/2016 Dana Whitaker 08-26-1975 [id number]   PRE-OPERATIVE DIAGNOSIS:     Morbid obesity with BMI of 51   Apnea, sleep   Heartburn (no med)   Prediabetes   Hypertriglyceridemia   Fatty liver   Bilateral carpal tunnel syndrome  POST-OPERATIVE DIAGNOSIS:  Same + small hiatal hernia  PROCEDURE:  Procedure(s): LAPAROSCOPIC SLEEVE GASTRECTOMY WITH HIATAL HERNIA REPAIR UPPER GI ENDOSCOPY  SURGEON:  Surgeon(s): [name], MD FACS FASMBS  ASSISTANTS: Romana Juniper MD  ANESTHESIA:   general  DRAINS: none   EBL: minimal  BOUGIE: 40 fr ViSiGi  LOCAL MEDICATIONS USED:   Exparel  SPECIMEN:  Source of Specimen:  Greater curvature of stomach  DISPOSITION OF SPECIMEN:  PATHOLOGY  COUNTS:  YES  INDICATION FOR PROCEDURE: This is a very pleasant 40 y.o.-year-old morbidly obese female who has had unsuccessful attempts for sustained weight loss. The patient presents today for a planned laparoscopic sleeve gastrectomy with upper endoscopy. We have discussed the risk and benefits of the procedure extensively preoperatively. Please see my separate notes.  PROCEDURE: After obtaining informed consent and receiving 5000 units of subcutaneous heparin, the patient was brought to the operating room at [facility] and placed supine on the operating room table. General endotracheal anesthesia was established. Sequential compression devices were placed. A orogastric tube was placed. The patient's abdomen was prepped and draped in the usual standard surgical fashion. The patient received preoperative IV antibiotic. A surgical timeout was performed.  Access to the abdomen was achieved using a 5 mm 0 laparoscope thru a 5 mm trocar In the left upper Quadrant 2 fingerbreadths below the left subcostal margin using the Optiview technique. Pneumoperitoneum was smoothly established up to 15 mm of mercury. The laparoscope was advanced and the abdominal cavity was surveilled. The patient was then  placed in reverse Trendelenburg. There was no evidence of a hiatal hernia on laparoscopy - no gap in the left and right crus anteriorly.  A 5 mm trocar was placed slightly above and to the left of the umbilicus under direct visualization.  The [facility] liver retractor was placed under the left lobe of the liver through a 5 mm trocar incision site in the subxiphoid position. A 5 mm trocar was placed in the lateral right upper quadrant along with a 15 mm trocar in the mid right abdomen. A final 5 mm trocar was placed in the lateral LUQ.  exparel was infiltrated into b/l lateral upper abdominal walls as a TAPP block. All under direct visualization after local had been infiltrated.  The stomach was inspected. It was completely decompressed and the orogastric tube was removed.  Her preop UGI showed no hiatal hernia. There was no anterior dimple that was obviously visible. The calibration tube was placed in the oropharynx and guided down into the stomach by the CRNA. 10 mL of air was insufflated into the calibration balloon. The calibration tubing was then gently pulled back by the CRNA and it slid past the GE junction. At this point the calibration tubing was desufflated and pulled back into the esophagus. This confirmed my suspicion of a clinically significant hiatal hernia. The gastrohepatic ligament was incised with harmonic scalpel. The right crus was identified. We identified the crossing fat along the right crus. The adipose tissue just above this area was incised with harmonic scalpel. I then bluntly dissected out this area and identified the left crus. There was evidence of a hiatal hernia. I then mobilized the esophagus. The left and right crus  were further mobilized with blunt dissection. I was then able to reapproximate the left and right crus with 0 Ethibond using an Endostitch suture device and securing it with a titanium tyknot.  We then had the CRNA readvanced the calibration tubing back into the  stomach. 10 mL of air was insufflated into the calibration tube balloon. The calibration tube was then gently pulled back and there was resistance at the GE junction. The tube did not slide back up into the esophagus. At this point the calibration tubing was deflated and removed from the patient's body.   We identified the pylorus and measured 6 cm proximal to the pylorus and identified an area of where we would start taking down the short gastric vessels. Harmonic scalpel was used to take down the short gastric vessels along the greater curvature of the stomach. We were able to enter the lesser sac. We continued to march along the greater curvature of the stomach taking down the short gastrics. As we approached the gastrosplenic ligament we took care in this area not to injure the spleen. We were able to take down the entire gastrosplenic ligament. We then mobilized the fundus away from the left crus of diaphragm. There were not any significant posterior gastric avascular attachments. This left the stomach completely mobilized. No vessels had been taken down along the lesser curvature of the stomach.  We then reidentified the pylorus. A 40Fr ViSiGi was then placed in the oropharynx and advanced down into the stomach and placed in the distal antrum and positioned along the lesser curvature. It was placed under suction which secured the 40Fr ViSiGi in place along the lesser curve. Then using the Ethicon echelon 60 mm stapler with a green load with Seamguard, I placed a stapler along the antrum approximately 5 cm from the pylorus. The stapler was angled so that there is ample room at the angularis incisura. I then fired the first staple load after inspecting it posteriorly to ensure adequate space both anteriorly and posteriorly. At this point I still was not completely past the angularis so with another green load with Seamguard, I placed the stapler in position just inside the prior stapleline. We then rotated  the stomach to insure that there was adequate anteriorly as well as posteriorly. The stapler was then fired.  At this point I started using 60 mm gold load staple cartridge x1 with Seamguard. The echelon stapler was then repositioned with a 60 mm blue load with Seamguard and we continued to march up along the [place]. My assistant was holding traction along the greater curvature stomach along the cauterized short gastric vessels ensuring that the stomach was symmetrically retracted. Prior to each firing of the staple, we rotated the stomach to ensure that there is adequate stomach left.  As we approached the fundus, I used 60 mm blue cartridge with Seamguard aiming slightly lateral to the esophageal fat pad. Although the staples on this fire had completely gone thru the last part of the stomach it had not completely cut it. Therefore 1 additional 60 blue load was used to free the remaining stomach. The sleeve was inspected. There is no evidence of cork screw. The staple line appeared hemostatic. The CRNA inflated the ViSiGi to the green zone and the upper abdomen was flooded with saline. There were no bubbles. The sleeve was decompressed and the ViSiGi removed. My assistant scrubbed out and performed an upper endoscopy. The sleeve easily distended with air and the scope was easily advanced  to the pylorus. There is no evidence of internal bleeding or cork screwing. There was no narrowing at the angularis. There is no evidence of bubbles. Please see her operative note for further details. The gastric sleeve was decompressed and the endoscope was removed.  The greater curvature the stomach was grasped with a laparoscopic grasper and removed from the 15 mm trocar site.  The liver retractor was removed. I then closed the 15 mm trocar site with 1 interrupted 0 Vicryl sutures through the fascia using the endoclose. The closure was viewed laparoscopically and it was airtight. Remaining exparel was then infiltrated in the  preperitoneal spaces around the extraction site. Pneumoperitoneum was released. All trocar sites were closed with a 4-0 Monocryl in a subcuticular fashion followed by the application of Dermabond. The patient was extubated and taken to the recovery room in stable condition. All needle, instrument, and sponge counts were correct x2. There are no immediate complications  (2) 60 mm green with Seamguard (1) 60 mm gold with seamguard (3) 60 mm blue with 2 seamguard  PLAN OF CARE: Admit to inpatient   PATIENT DISPOSITION:  PACU - hemodynamically stable.   Delay start of Pharmacological VTE agent (>24hrs) due to surgical blood loss or risk of bleeding:  no  Leighton Ruff. Redmond Pulling, MD, FACS FASMBS General, Bariatric, & Minimally Invasive Surgery [facility], [place]

## 2016-06-13 NOTE — H&P (View-Only) (Signed)
Dana Whitaker. Dana Whitaker 06/09/2016 2:27 PM Location: [facility] Patient #: [id number] DOB: [birth year] Married / Language: English / Race: White Female  History of Present Illness Randall Hiss M. Paidyn Mcferran MD; 06/09/2016 3:21 PM) The patient is a 40 year old female who presents for a pre-op visit. She comes in today for her preoperative visit. She has been scheduled and approved for laparoscopic sleeve gastrectomy. I initially met her in July of this year. Her weight at that time was 289 pounds with a BMI of 50.5. Since that time she has stopped smoking. She states her last cigarette was on November 7. A urine nicotine test confirmed this. Otherwise she denies any medical changes since she was initially seen. She denies any chest pain, chest pressure, shows of breath, orthopnea, paroxysmal nocturnal dyspnea. She has some occasional infrequent reflux. She still gets occasional steroid injections for carpal tunnel.  I reviewed her upper endoscopy from 2016 which showed some mild gastritis and esophagitis but no evidence of a hiatal hernia. H. pylori biopsy was negative. Upper GI imaging was unremarkable along with her chest x-ray. Bariatric evaluation labs were unremarkable except for hemoglobin A1c of 6.2 and a triglyceride level of 163.   Problem List/Past Medical Randall Hiss Ronnie Derby, MD; 06/09/2016 3:21 PM) GASTROESOPHAGEAL REFLUX DISEASE, ESOPHAGITIS PRESENCE NOT SPECIFIED (K21.9) very mild, no meds, no reflux on UGI MORBID OBESITY WITH BMI OF 50.0-59.9, ADULT (E66.01) FATTY LIVER (K76.0) OSA (OBSTRUCTIVE SLEEP APNEA) (G47.33) CARPAL TUNNEL SYNDROME, BILATERAL (G56.03) HYPERTRIGLYCERIDEMIA (E78.1) Mildly elevated at 163 TOBACCO USE (Z72.0) PREDIABETES (R73.03) Preoperative A1c was 6.2  Other Problems Dana Curry, MD; 06/09/2016 3:21 PM) Vascular Disease Sleep Apnea Gastroesophageal Reflux Disease Depression Migraine Headache Other disease, cancer, significant  illness Oophorectomy Bilateral. Anxiety Disorder Cancer Back Pain  Past Surgical History Dana Curry, MD; 06/09/2016 3:21 PM) Cesarean Section - Multiple Tonsillectomy Hysterectomy (due to cancer) - Complete  Diagnostic Studies History Dana Curry, MD; 06/09/2016 3:21 PM) Pap Smear 1-5 years ago Mammogram >3 years ago  Allergies Dana Whitaker, CMA; 06/09/2016 2:27 PM) TraMADol HCl *ANALGESICS - OPIOID* Hives. Ciprofloxacin HCl *CHEMICALS* Rash.  Medication History Dana Curry, MD; 06/09/2016 3:21 PM) Medications Reconciled Ondansetron (4MG Tablet Disint, 1 (one) Tablet Oral every six hours, as needed, Taken starting 06/09/2016) Active. No Current Medications (Taken starting 06/09/2016) Protonix (40MG Tablet DR, 1 (one) Tablet Oral daily, Taken starting 06/09/2016) Active. OxyCODONE HCl (5MG/5ML Solution, 5-10 Milliliter Oral every four hours, as needed, Taken starting 06/09/2016) Active. Multivitamin Adult (1 (one) Tablet Chewable Oral two times daily, Taken starting 06/09/2016) Active. Calcium Citrate Chewy Bite (500-500MG-UNIT Tablet Chewable, 1 (one) Tablet Chewable Oral three times daily, Taken starting 06/09/2016) Active.  Social History Dana Curry, MD; 06/09/2016 3:21 PM) Alcohol use Occasional alcohol use. Caffeine use Carbonated beverages, Coffee. Tobacco use Current every day smoker. No drug use  Family History Dana Curry, MD; 06/09/2016 3:21 PM) Bleeding disorder Sister. Arthritis Mother. Cerebrovascular Accident Family Members In General. Diabetes Mellitus Family Members In General. Depression Mother, Sister, Son. Thyroid problems Family Members In General. Hypertension Mother.  Pregnancy / Birth History Dana Curry, MD; 06/09/2016 3:21 PM) Age of menopause <46 Age at menarche 22 years. Gravida 1 Para 1 Maternal age 67-20     Review of Systems Randall Hiss M. Dana Fortin MD; 06/09/2016 3:19 PM) General Present-  Fatigue, Night Sweats and Weight Gain. Not Present- Appetite Loss, Chills, Fever and Weight Loss. Skin Not Present- Change in Wart/Mole, Dryness, Hives, Jaundice, New Lesions, Non-Healing Wounds,  Rash and Ulcer. HEENT Present- Seasonal Allergies and Sore Throat. Not Present- Earache, Hearing Loss, Hoarseness, Nose Bleed, Oral Ulcers, Ringing in the Ears, Sinus Pain, Visual Disturbances, Wears glasses/contact lenses and Yellow Eyes. Respiratory Present- Chronic Cough, Difficulty Breathing and Snoring. Not Present- Bloody sputum and Wheezing. Breast Not Present- Breast Mass, Breast Pain, Nipple Discharge and Skin Changes. Cardiovascular Present- Leg Cramps, Shortness of Breath and Swelling of Extremities. Not Present- Chest Pain, Difficulty Breathing Lying Down, Palpitations and Rapid Heart Rate. Gastrointestinal Present- Difficulty Swallowing and Excessive gas. Not Present- Abdominal Pain, Bloating, Bloody Stool, Change in Bowel Habits, Chronic diarrhea, Constipation, Gets full quickly at meals, Hemorrhoids, Indigestion, Nausea, Rectal Pain and Vomiting. Female Genitourinary Present- Frequency and Urgency. Not Present- Nocturia, Painful Urination and Pelvic Pain. Musculoskeletal Present- Back Pain, Joint Pain, Joint Stiffness, Muscle Pain, Muscle Weakness and Swelling of Extremities. Neurological Present- Headaches and Numbness. Not Present- Decreased Memory, Fainting, Seizures, Tingling, Tremor, Trouble walking and Weakness. Psychiatric Present- Anxiety, Change in Sleep Pattern and Depression. Not Present- Bipolar, Fearful and Frequent crying. Endocrine Present- Excessive Hunger and Hot flashes. Not Present- Cold Intolerance, Hair Changes, Heat Intolerance and New Diabetes. Hematology Not Present- Blood Thinners, Easy Bruising, Excessive bleeding, Gland problems, HIV and Persistent Infections.  Vitals [name] CMA; 06/09/2016 2:29 PM) 06/09/2016 2:28 PM Weight: 294 lb Height: 63.5in Body  Surface Area: 2.29 m Body Mass Index: 51.26 kg/m  Temp.: 98.48F  Pulse: 70 (Regular)  BP: 124/82 (Sitting, Left Arm, Standard)      Physical Exam Randall Hiss M. Blayke Pinera MD; 06/09/2016 3:19 PM)  General Mental Status-Alert. General Appearance-Consistent with stated age. Hydration-Well hydrated. Voice-Normal. Note: Morbid obesity, apple-shaped  Head and Neck Head-normocephalic, atraumatic with no lesions or palpable masses. Trachea-midline. Thyroid Gland Characteristics - normal size and consistency.  Eye Eyeball - Bilateral-Extraocular movements intact. Sclera/Conjunctiva - Bilateral-No scleral icterus.  Chest and Lung Exam Chest and lung exam reveals -quiet, even and easy respiratory effort with no use of accessory muscles and on auscultation, normal breath sounds, no adventitious sounds and normal vocal resonance. Inspection Chest Wall - Normal. Back - normal.  Breast - Did not examine.  Cardiovascular Cardiovascular examination reveals -normal heart sounds, regular rate and rhythm with no murmurs and normal pedal pulses bilaterally.  Abdomen Inspection Inspection of the abdomen reveals - No Hernias. Skin - Scar - Note: Well-healed trocar scars. Palpation/Percussion Palpation and Percussion of the abdomen reveal - Soft, Non Tender, No Rebound tenderness, No Rigidity (guarding) and No hepatosplenomegaly. Auscultation Auscultation of the abdomen reveals - Bowel sounds normal.  Peripheral Vascular Upper Extremity Palpation - Pulses bilaterally normal.  Neurologic Neurologic evaluation reveals -alert and oriented x 3 with no impairment of recent or remote memory. Mental Status-Normal.  Neuropsychiatric The patient's mood and affect are described as -normal. Judgment and Insight-insight is appropriate concerning matters relevant to self.  Musculoskeletal Normal Exam - Left-Upper Extremity Strength Normal and Lower Extremity  Strength Normal. Normal Exam - Right-Upper Extremity Strength Normal and Lower Extremity Strength Normal.  Lymphatic Head & Neck  General Head & Neck Lymphatics: Bilateral - Description - Normal. Axillary - Did not examine. Femoral & Inguinal - Did not examine.    Assessment & Plan Randall Hiss M. Sussan Meter MD; 06/09/2016 3:19 PM)  MORBID OBESITY WITH BMI OF 50.0-59.9, ADULT (E66.01) Impression: We reviewed her preoperative workup. I congratulated her on stopping smoking. Her urine nicotine test was negative. We discussed the typical hospital and postoperative recovery. She was given prescriptions for postoperative pain, heartburn and nausea  prescriptions today. She also requested formal prescriptions for her vitamins and supplements since she states that insurance will cover that. All of her questions were asked and answered.  Current Plans Started Ondansetron 4MG, 1 (one) Tablet every six hours, as needed, #15, 06/09/2016, No Refill. Started Protonix 40MG, 1 (one) Tablet daily, #30, 30 days starting 06/09/2016, No Refill. Started OxyCODONE HCl 5MG/5ML, 5-10 Milliliter every four hours, as needed, 100 Milliliter, 06/09/2016, No Refill. Started Multivitamin Adult, 1 (one) Tablet Chewable two times daily, #60, 06/09/2016, Ref. x12. Started Calcium Citrate Chewy Bite 500-500MG-UNIT, 1 (one) Tablet Chewable three times daily, #90, 06/09/2016, Ref. x12. Pt Education - EMW_preopbariatric GASTROESOPHAGEAL REFLUX DISEASE, ESOPHAGITIS PRESENCE NOT SPECIFIED (K21.9) Story: very mild, no meds, no reflux on UGI  CARPAL TUNNEL SYNDROME, BILATERAL (G56.03)  FATTY LIVER (K76.0)  PREDIABETES (R73.03) Story: Preoperative A1c was 6.2  TOBACCO USE (Z72.0) Impression: Resolve-urine nicotine test negative. Can gradually did her on stopping smoking  HYPERTRIGLYCERIDEMIA (E78.1) Story: Mildly elevated at [address] Redmond Pulling, MD, FACS General, Bariatric, & Minimally Invasive Surgery [facility]  Surgery, [place]

## 2016-06-13 NOTE — Anesthesia Postprocedure Evaluation (Signed)
Anesthesia Post Note  Patient: Dana Whitaker  Procedure(s) Performed: Procedure(s) (LRB): LAPAROSCOPIC GASTRIC SLEEVE RESECTION WITH HIATAL HERNIA REPAIR AND UPPER ENDOSCOPY (N/A)  Patient location during evaluation: PACU Anesthesia Type: General Level of consciousness: awake and alert Pain management: pain level controlled Vital Signs Assessment: post-procedure vital signs reviewed and stable Respiratory status: spontaneous breathing, nonlabored ventilation, respiratory function stable and patient connected to nasal cannula oxygen Cardiovascular status: blood pressure returned to baseline and stable Postop Assessment: no signs of nausea or vomiting Anesthetic complications: no    Last Vitals:  Vitals:   06/13/16 1530 06/13/16 1545  BP: 103/66 110/75  Pulse: 71 71  Resp: 14 15  Temp:      Last Pain:  Vitals:   06/13/16 1545  TempSrc:   PainSc: Asleep                 Sharen Youngren,W. EDMOND

## 2016-06-13 NOTE — Interval H&P Note (Signed)
History and Physical Interval Note:  06/13/2016 12:21 PM  Dana Whitaker  has presented today for surgery, with the diagnosis of MORBID OBESITY  The various methods of treatment have been discussed with the patient and family. After consideration of risks, benefits and other options for treatment, the patient has consented to  Procedure(s): LAPAROSCOPIC GASTRIC SLEEVE RESECTION WITH UPPER ENDO (N/A) as a surgical intervention .  The patient's history has been reviewed, patient examined, no change in status, stable for surgery.  I have reviewed the patient's chart and labs.  Questions were answered to the patient's satisfaction.    Leighton Ruff. Redmond Pulling, MD, [place], Bariatric, & Minimally Invasive Surgery [facility] Surgery, [place]   [facility]

## 2016-06-13 NOTE — Op Note (Signed)
Preoperative diagnosis: laparoscopic sleeve gastrectomy  Postoperative diagnosis: Same   Procedure: Upper endoscopy   Surgeon: Clovis Riley M.D.  Anesthesia: Gen.   Indications for procedure: This patient was undergoing a laparoscopic sleeve gastrectomy.   Description of procedure: The endoscopy was placed in the mouth and into the oropharynx and under endoscopic vision it was advanced to the esophagogastric junction. The pouch was insufflated and no bleeding or bubbles were seen. The GEJ was identified at 40cm from the teeth. The angularis incisura was widely patent. No bleeding or leaks were detected. The scope was withdrawn without difficulty.   Clovis Riley, M.D. General, Bariatric, & Minimally Invasive Surgery [facility] [place]

## 2016-06-13 NOTE — Progress Notes (Signed)
Pt refuse CPAP for the night. No distress or complications noted. Pt is on RA

## 2016-06-14 LAB — CBC WITH DIFFERENTIAL/PLATELET
BASOS PCT: 0 %
Basophils Absolute: 0 10*3/uL (ref 0.0–0.1)
EOS ABS: 0 10*3/uL (ref 0.0–0.7)
Eosinophils Relative: 0 %
HCT: 36.5 % (ref 36.0–46.0)
HEMOGLOBIN: 12.3 g/dL (ref 12.0–15.0)
Lymphocytes Relative: 7 %
Lymphs Abs: 0.8 10*3/uL (ref 0.7–4.0)
MCH: 29.6 pg (ref 26.0–34.0)
MCHC: 33.7 g/dL (ref 30.0–36.0)
MCV: 87.7 fL (ref 78.0–100.0)
Monocytes Absolute: 0.4 10*3/uL (ref 0.1–1.0)
Monocytes Relative: 3 %
NEUTROS PCT: 90 %
Neutro Abs: 11 10*3/uL — ABNORMAL HIGH (ref 1.7–7.7)
Platelets: 232 10*3/uL (ref 150–400)
RBC: 4.16 MIL/uL (ref 3.87–5.11)
RDW: 14.2 % (ref 11.5–15.5)
WBC: 12.3 10*3/uL — AB (ref 4.0–10.5)

## 2016-06-14 LAB — COMPREHENSIVE METABOLIC PANEL
ALBUMIN: 3.7 g/dL (ref 3.5–5.0)
ALK PHOS: 66 U/L (ref 38–126)
ALT: 37 U/L (ref 14–54)
ANION GAP: 7 (ref 5–15)
AST: 34 U/L (ref 15–41)
BUN: 12 mg/dL (ref 6–20)
CALCIUM: 9 mg/dL (ref 8.9–10.3)
CO2: 25 mmol/L (ref 22–32)
Chloride: 107 mmol/L (ref 101–111)
Creatinine, Ser: 0.62 mg/dL (ref 0.44–1.00)
GFR calc Af Amer: >60 mL/min (ref >60)
GFR calc non Af Amer: >60 mL/min (ref >60)
GLUCOSE: 148 mg/dL — AB (ref 65–99)
Potassium: 4.3 mmol/L (ref 3.5–5.1)
SODIUM: 139 mmol/L (ref 135–145)
Total Bilirubin: 0.4 mg/dL (ref 0.3–1.2)
Total Protein: 6.9 g/dL (ref 6.5–8.1)

## 2016-06-14 MED ORDER — OXYCODONE HCL 5 MG/5ML PO SOLN: 5.0000 mg | ORAL | 0 refills | Status: DC | PRN

## 2016-06-14 NOTE — Progress Notes (Signed)
Assessment unchanged.  All questions pertaining to D/C we answered.  Pt was D/C'd via wheelchair and accompanied by NT.  Roselind Rily

## 2016-06-14 NOTE — Discharge Summary (Signed)
Physician Discharge Summary  Farrell Ours KSK:[id number] DOB: December 31, 1975 DOA: 06/13/2016  PCP: Keith Rake, MD  Admit date: 06/13/2016 Discharge date: 06/14/2016  Recommendations for Outpatient Follow-up:   Follow-up Information    Keith Rake, MD Follow up.   Specialty:  Family Medicine Contact information: [address] [place] [address] [phone]        [name], MD Follow up on 07/08/2016.   Specialty:  General Surgery Why:  post op follow up  at 4:15pm  Contact information: [address] [place] [address] [place], MD Follow up.   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 Please see operative note. Preoperatively the patient was given 5000 units of subcutaneous heparin for DVT prophylaxis. Postoperative prophylactic Lovenox dosing was started on the morning of postoperative day 1. On the evening of postoperative day 0, the patient was started on water and ice chips. On postoperative day 1 the patient had no fever or tachycardia and was tolerating water in their diet was gradually advanced throughout the day. The patient  was ambulating without difficulty. Their vital signs are stable without fever or tachycardia. Their hemoglobin had remained stable.  The patient had received discharge instructions and counseling. They were deemed stable for discharge and had met discharge criteria   Discharge Instructions  Discharge Instructions    Ambulate hourly while awake    Complete by:  As directed    Call MD for:  difficulty breathing, headache or visual disturbances    Complete by:  As directed    Call MD for:  persistant dizziness or light-headedness    Complete by:  As directed    Call MD for:  persistant nausea and vomiting    Complete by:  As directed    Call MD for:  redness, tenderness, or signs of infection (pain, swelling, redness, odor or green/yellow discharge around incision site)    Complete by:  As directed    Call MD for:  severe uncontrolled pain    Complete by:  As directed    Call MD for:  temperature >101 F    Complete by:  As directed    Diet bariatric full liquid    Complete by:  As directed    Discharge instructions    Complete by:  As directed    See bariatric discharge instructions   Incentive spirometry    Complete by:  As directed    Perform hourly while awake       Medication List    TAKE these medications   oxyCODONE 5 MG/5ML solution Commonly known as:  ROXICODONE Take 5-10 mLs (5-10 mg total)  by mouth every 4 (four) hours as needed for moderate pain or severe pain.      Follow-up Information    Keith Rake, MD Follow up.   Specialty:  Family Medicine Contact information: [address] [place] [address] [phone]        [name], MD Follow up on 07/08/2016.   Specialty:  General Surgery Why:  post op follow up  at 4:15pm  Contact information: [address] [place] [address] [place], MD Follow up.   Specialty:  General Surgery Contact information: [place]  [address] [phone]            The results of significant diagnostics from this hospitalization (including imaging, microbiology, ancillary and laboratory) are listed below for reference.    Significant Diagnostic Studies: No results found.  Labs: Basic Metabolic Panel:  Recent Labs Lab 06/09/16 0955 06/14/16 0533  NA 137 139  K 4.1 4.3  CL 105 107  CO2 25 25  GLUCOSE 97 148*  BUN 19 12  CREATININE 0.75 0.62  CALCIUM 9.3 9.0   Liver Function Tests:  Recent Labs Lab 06/13/16 1145 06/14/16 0533  AST 20 34  ALT 22 37  ALKPHOS 74 66  BILITOT 0.7 0.4  PROT 7.8 6.9  ALBUMIN 4.3 3.7    CBC:  Recent Labs Lab 06/09/16 0955 06/13/16 1459 06/14/16 0533  WBC 7.4  --  12.3*  NEUTROABS  --   --  11.0*  HGB 13.3 12.2 12.3  HCT 40.0 37.1 36.5  MCV 91.3  --  87.7  PLT 262  --  232    CBG:  Recent Labs Lab 06/13/16 1137  GLUCAP 91    Principal Problem:   Morbid obesity with BMI of 50.0-59.9, adult ([place]) Active Problems:   Apnea, sleep   Heartburn   Prediabetes   Hypertriglyceridemia   Fatty liver   Bilateral carpal tunnel syndrome   S/P laparoscopic sleeve gastrectomy   Time coordinating discharge: 10 min  Signed:  [name], MD [facility] Surgery, [place] [phone] 06/14/2016, 2:11 PM

## 2016-06-14 NOTE — Progress Notes (Signed)
Patient ID: Dana Whitaker, female   DOB: 11/24/1975, 40 y.o.   MRN: [id number]  Progress Note: Metabolic and Bariatric Surgery Service   Subjective: Didn't get water last night. Has had a few sips of water this morning. Not much nausea. Ambulated last night. However didn't sleep well. Has some generalized abdominal discomfort mainly at extraction site  Objective: Vital signs in last 24 hours: Temp:  [97.6 F (36.4 C)-98.6 F (37 C)] 98.1 F (36.7 C) (12/12 0535) Pulse Rate:  [60-101] 92 (12/12 0535) Resp:  [13-20] 18 (12/12 0535) BP: (97-138)/(58-106) 117/62 (12/12 0535) SpO2:  [93 %-100 %] 93 % (12/12 0535) Weight:  [132.6 kg (292 lb 6 oz)-133.6 kg (294 lb 9.6 oz)] 133.6 kg (294 lb 9.6 oz) (12/11 2158) Last BM Date: 06/14/16  Intake/Output from previous day: 12/11 0701 - 12/12 0700 In: 1900 [I.V.:1900] Out: 1070 [Urine:1050; Blood:20] Intake/Output this shift: Total I/O In: -  Out: 600 [Urine:600]  Lungs: Clear to auscultation nonlabored  Cardiovascular: Regular  Abd: Soft, incisions-clean, dry, intact; appropriate mild tenderness at the extraction site  Extremities: No edema, positive SCDs  Neuro: Nonfocal, appropriate  Lab Results: CBC   Recent Labs  06/13/16 1459 06/14/16 0533  WBC  --  12.3*  HGB 12.2 12.3  HCT 37.1 36.5  PLT  --  232   BMET  Recent Labs  06/14/16 0533  NA 139  K 4.3  CL 107  CO2 25  GLUCOSE 148*  BUN 12  CREATININE 0.62  CALCIUM 9.0   Hepatic Function Latest Ref Rng & Units 06/14/2016 06/13/2016 07/02/2015  Total Protein 6.5 - 8.1 g/dL 6.9 7.8 7.8  Albumin 3.5 - 5.0 g/dL 3.7 4.3 4.1  AST 15 - 41 U/L 34 20 18  ALT 14 - 54 U/L 37 22 23  Alk Phosphatase 38 - 126 U/L 66 74 69  Total Bilirubin 0.3 - 1.2 mg/dL 0.4 0.7 0.5  Bilirubin, Direct 0.1 - 0.5 mg/dL - 0.1 -    PT/INR No results for input(s): LABPROT, INR in the last 72 hours. ABG No results for input(s): PHART, HCO3 in the last 72 hours.  Invalid input(s): PCO2,  PO2  Studies/Results:  Anti-infectives: Anti-infectives    Start     Dose/Rate Route Frequency Ordered Stop   06/13/16 1133  cefoTEtan in Dextrose 5% (CEFOTAN) IVPB 2 g     2 g Intravenous On call to O.R. 06/13/16 1133 06/13/16 1338      Medications: Scheduled Meds: . acetaminophen (TYLENOL) oral liquid 160 mg/5 mL  1,000 mg Oral Q6H  . enoxaparin (LOVENOX) injection  30 mg Subcutaneous Q12H  . pantoprazole (PROTONIX) IV  40 mg Intravenous QHS  . [START ON 06/15/2016] protein supplement shake  2 oz Oral Q2H   Continuous Infusions: . dextrose 5 % and 0.45 % NaCl with KCl 20 mEq/L 125 mL/hr at 06/14/16 0141   PRN Meds:.oxyCODONE **AND** acetaminophen, acetaminophen (TYLENOL) oral liquid 160 mg/5 mL, diphenhydrAMINE, morphine injection, ondansetron (ZOFRAN) IV, promethazine  Assessment/Plan: Patient Active Problem List   Diagnosis Date Noted  . Fatty liver 06/13/2016  . Bilateral carpal tunnel syndrome 06/13/2016  . S/P laparoscopic sleeve gastrectomy 06/13/2016  . Hypertriglyceridemia 05/19/2016  . History of fracture of left ankle 05/19/2016  . Former cigarette smoker 02/17/2016  . Prediabetes 02/17/2016  . Leukocytosis 02/17/2016  . 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 Status post laparoscopic hysterectomy 11/10/2015  . Morbid obesity with BMI of 50.0-59.9, adult ([place]) 11/10/2015  . Dermatitis of  foot 07/31/2015  . Tinea pedis of right foot 07/20/2015  . Abdominal pain, RUQ (right upper quadrant) 06/18/2015  . Heartburn 06/18/2015  . Aggrieved 01/13/2015  . Borderline personality disorder 01/13/2015  . ADHD (attention deficit hyperactivity disorder), combined type 01/13/2015  . Clinical depression 01/13/2015  . H/O: obesity 01/13/2015  . H/O disease 01/13/2015  . Apnea, sleep 01/13/2015  . Anxiety, generalized 01/13/2015  . Insomnia, persistent 01/13/2015  . Depression, major, recurrent, moderate ([place]) 01/13/2015  . Fibromyalgia 01/13/2015  .  Adjustment disorder with depressed mood 01/13/2015  . ADD (attention deficit disorder) 01/13/2015  . Moderate episode of recurrent major depressive disorder ([place]) 01/13/2015  . Major depressive disorder with single episode 01/13/2015  . Polymyositis ([place]) 01/13/2015  . Classical migraine with intractable migraine 09/12/2014  . Extreme obesity ([place]) 09/12/2014  . 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 Morbid obesity ([place]) 09/12/2014   s/p Procedure(s): LAPAROSCOPIC GASTRIC SLEEVE RESECTION WITH HIATAL HERNIA REPAIR AND UPPER ENDOSCOPY 06/13/2016  Principal Problem:   Morbid obesity with BMI of 50.0-59.9, adult (HCC) Active Problems:   Apnea, sleep   Heartburn   Prediabetes   Hypertriglyceridemia   Fatty liver   Bilateral carpal tunnel syndrome   S/P laparoscopic sleeve gastrectomy  Doing well. No fever. No tachycardia. Will let patient have water ad lib. this morning. Once tolerates 12oz water will advance to protein shakes Pulmonary toilet, incentive spirometer Continue chemical DVT prophylaxis Start discharge teaching, possible discharge later today if meets fluid goals  Disposition:  LOS: 1 day  The patient will be in the hospital for normal postop protocol  [name], MD [phone] [facility]

## 2016-06-14 NOTE — Addendum Note (Signed)
Addendum  created 06/14/16 0104 by West Pugh, CRNA   Charge Capture section accepted

## 2016-06-14 NOTE — Progress Notes (Signed)
Patient alert and oriented, pain is controlled. Patient is tolerating fluids, advanced to protein shake today, patient is tolerating well.  Reviewed Gastric sleeve discharge instructions with patient and patient is able to articulate understanding.  Provided information on BELT program, Support Group and [facility] outpatient pharmacy. All questions answered, will continue to monitor.  Ravina Milner RN  

## 2016-06-14 NOTE — Plan of Care (Signed)
Problem: Food- and Nutrition-Related Knowledge Deficit (NB-1.1) Goal: Nutrition education Formal process to instruct or train a patient/client in a skill or to impart knowledge to help patients/clients voluntarily manage or modify food choices and eating behavior to maintain or improve health. Outcome: Completed/Met Date Met: 06/14/16 Nutrition Education Note  Received consult for diet education per DROP protocol.   Discussed 2 week post op diet with pt. Emphasized that liquids must be non carbonated, non caffeinated, and sugar free. Fluid goals discussed. Reviewed progression of diet to include soft proteins at 7-10 days post-op. Pt to follow up with outpatient bariatric RD for further diet progression after 2 weeks. Multivitamins and minerals also reviewed. Teach back method used, pt expressed understanding, expect good compliance.   Diet: First 2 Weeks  You will see the dietitian about two (2) weeks after your surgery. The dietitian will increase the types of foods you can eat if you are handling liquids well:  If you have severe vomiting or nausea and cannot handle clear liquids lasting longer than 1 day, call your surgeon  Protein Shake  Drink at least 2 ounces of shake 5-6 times per day  Each serving of protein shakes (usually 8 - 12 ounces) should have a minimum of:  15 grams of protein  And no more than 5 grams of carbohydrate  Goal for protein each day:  Men = 80 grams per day  Women = 60 grams per day  Protein powder may be added to fluids such as non-fat milk or Lactaid milk or Soy milk (limit to 35 grams added protein powder per serving)   Hydration  Slowly increase the amount of water and other clear liquids as tolerated (See Acceptable Fluids)  Slowly increase the amount of protein shake as tolerated  Sip fluids slowly and throughout the day  May use sugar substitutes in small amounts (no more than 6 - 8 packets per day; i.e. Splenda)   Fluid Goal  The first goal is to  drink at least 8 ounces of protein shake/drink per day (or as directed by the nutritionist); some examples of protein shakes are Syntrax Nectar, Adkins Advantage, EAS Edge HP, and Unjury. See handout from pre-op Bariatric Education Class:  Slowly increase the amount of protein shake you drink as tolerated  You may find it easier to slowly sip shakes throughout the day  It is important to get your proteins in first  Your fluid goal is to drink 64 - 100 ounces of fluid daily  It may take a few weeks to build up to this  32 oz (or more) should be clear liquids  And  32 oz (or more) should be full liquids (see below for examples)  Liquids should not contain sugar, caffeine, or carbonation   Clear Liquids:  Water or Sugar-free flavored water (i.e. Fruit H2O, Propel)  Decaffeinated coffee or tea (sugar-free)  Crystal Lite, Wyler's Lite, Minute Maid Lite  Sugar-free Jell-O  Bouillon or broth  Sugar-free Popsicle: *Less than 20 calories each; Limit 1 per day   Full Liquids:  Protein Shakes/Drinks + 2 choices per day of other full liquids  Full liquids must be:  No More Than 12 grams of Carbs per serving  No More Than 3 grams of Fat per serving  Strained low-fat cream soup  Non-Fat milk  Fat-free Lactaid Milk  Sugar-free yogurt (Dannon Lite & Fit, Greek yogurt)     Tereso Unangst, MS, RD, LDN Pager: [phone] After Hours Pager: [phone]    

## 2016-06-21 ENCOUNTER — Ambulatory Visit (INDEPENDENT_AMBULATORY_CARE_PROVIDER_SITE_OTHER): Payer: 59 | Admitting: Obstetrics and Gynecology

## 2016-06-21 ENCOUNTER — Encounter: Payer: Self-pay | Admitting: Obstetrics and Gynecology

## 2016-06-21 VITALS — BP 126/70 | HR 75 | Ht 63.75 in | Wt 277.3 lb

## 2016-06-21 DIAGNOSIS — Z1151 Encounter for screening for human papillomavirus (HPV): Secondary | ICD-10-CM | POA: Diagnosis not present

## 2016-06-21 DIAGNOSIS — B977 Papillomavirus as the cause of diseases classified elsewhere: Secondary | ICD-10-CM

## 2016-06-21 DIAGNOSIS — Z6841 Body Mass Index (BMI) 40.0 and over, adult: Secondary | ICD-10-CM | POA: Diagnosis not present

## 2016-06-21 DIAGNOSIS — Z90710 Acquired absence of both cervix and uterus: Secondary | ICD-10-CM

## 2016-06-21 DIAGNOSIS — Z9884 Bariatric surgery status: Secondary | ICD-10-CM | POA: Diagnosis not present

## 2016-06-21 DIAGNOSIS — C55 Malignant neoplasm of uterus, part unspecified: Secondary | ICD-10-CM

## 2016-06-21 NOTE — Progress Notes (Signed)
Chief complaint: 1. HPV high risk 2. Status post gastric sleeve surgery  Patient is doing well 1 week post surgery. She has lost 20 pounds since surgery. She expects to lose another 50 pounds in 6 months.  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 She is here for interval Pap smear. Annual exam is due in 6 months.  OBJECTIVE: BP 126/70   Pulse 75   Ht 5' 3.75" (1.619 m)   Wt 277 lb 4.8 oz (125.8 kg)   LMP 08/04/2013   BMI 47.97 kg/m  Pleasant female in no acute distress Abdomen: 6 laparoscopic port sites are healing well; minimal ecchymoses noted Pelvic: External External genitalia-normal BUS-normal Vagina-normal; good vault support Cervix-surgically absent Uterus-surgically absent  ASSESSMENT: 1. 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 Status post gastric sleeve surgery  PLAN: 1. Pap smear 2. Return in 6 months for annual exam  A total of 15 minutes were spent face-to-face with the patient during this encounter and over half of that time dealt with counseling and coordination of care.  Brayton Mars, MD  Note: This dictation was prepared with Dragon dictation along with smaller phrase technology. Any transcriptional errors that result from this process are unintentional.

## 2016-06-21 NOTE — Patient Instructions (Signed)
1. Pap smear is done. 2. Return in 6 months for annual exam

## 2016-06-21 NOTE — Addendum Note (Signed)
Addended by: Elouise Munroe on: 06/21/2016 10:13 AM   Modules accepted: Orders

## 2016-06-23 LAB — PAP IG W/ RFLX HPV ASCU: PAP SMEAR COMMENT: 0

## 2016-06-28 ENCOUNTER — Encounter: Payer: Self-pay | Admitting: Skilled Nursing Facility1

## 2016-06-28 ENCOUNTER — Encounter: Payer: 59 | Attending: General Surgery | Admitting: Skilled Nursing Facility1

## 2016-06-28 DIAGNOSIS — Z6841 Body Mass Index (BMI) 40.0 and over, adult: Secondary | ICD-10-CM | POA: Diagnosis not present

## 2016-06-28 DIAGNOSIS — Z720 Tobacco use: Secondary | ICD-10-CM | POA: Insufficient documentation

## 2016-06-28 DIAGNOSIS — E6609 Other obesity due to excess calories: Secondary | ICD-10-CM

## 2016-06-28 NOTE — Progress Notes (Signed)
Bariatric Class:  Appt start time: 1530 end time:  1630.  2 Week Post-Operative Nutrition Class  Patient was seen on 06/28/2016 for Post-Operative Nutrition [facility].   Surgery date: 06/13/2016 Surgery type: Sleeve gastrectomy Start weight at [facility]: 286 lbs on 04/14/16 Weight today: 272.8 lbs Wt change: (pre-op to today): 25.2  TANITA  BODY COMP RESULTS  05/30/16 06/28/2016   BMI (kg/m^2) 51.2 46.8   Fat Mass (lbs) 162.4 147   Fat Free Mass (lbs) 135.6 125.8   Total Body Water (lbs) 101.4 93.6    The following the learning objectives were met by the patient during this course:  Identifies Phase 3A (Soft, High Proteins) Dietary Goals and will begin from 2 weeks post-operatively to 2 months post-operatively  Identifies appropriate sources of fluids and proteins   States protein recommendations and appropriate sources post-operatively  Identifies the need for appropriate texture modifications, mastication, and bite sizes when consuming solids  Identifies appropriate multivitamin and calcium sources post-operatively  Describes the need for physical activity post-operatively and will follow MD recommendations  States when to call healthcare provider regarding medication questions or post-operative complications  Handouts given during class include:  Phase 3A: Soft, High Protein Diet Handout  Follow-Up Plan: Patient will follow-up at [facility] in 6 weeks for 2 month post-op nutrition visit for diet advancement per MD.

## 2016-06-29 ENCOUNTER — Inpatient Hospital Stay: Payer: 59

## 2016-06-29 ENCOUNTER — Inpatient Hospital Stay: Payer: 59 | Attending: Obstetrics and Gynecology | Admitting: Obstetrics and Gynecology

## 2016-06-29 ENCOUNTER — Encounter: Payer: Self-pay | Admitting: Obstetrics and Gynecology

## 2016-06-29 VITALS — BP 141/103 | HR 72 | Temp 97.7°F | Ht 64.0 in | Wt 275.5 lb

## 2016-06-29 DIAGNOSIS — L292 Pruritus vulvae: Secondary | ICD-10-CM | POA: Diagnosis not present

## 2016-06-29 DIAGNOSIS — Z90710 Acquired absence of both cervix and uterus: Secondary | ICD-10-CM | POA: Insufficient documentation

## 2016-06-29 DIAGNOSIS — F902 Attention-deficit hyperactivity disorder, combined type: Secondary | ICD-10-CM | POA: Diagnosis not present

## 2016-06-29 DIAGNOSIS — Z9884 Bariatric surgery status: Secondary | ICD-10-CM | POA: Diagnosis not present

## 2016-06-29 DIAGNOSIS — G5603 Carpal tunnel syndrome, bilateral upper limbs: Secondary | ICD-10-CM | POA: Insufficient documentation

## 2016-06-29 DIAGNOSIS — C541 Malignant neoplasm of endometrium: Secondary | ICD-10-CM

## 2016-06-29 DIAGNOSIS — R030 Elevated blood-pressure reading, without diagnosis of hypertension: Secondary | ICD-10-CM | POA: Insufficient documentation

## 2016-06-29 DIAGNOSIS — M797 Fibromyalgia: Secondary | ICD-10-CM | POA: Diagnosis not present

## 2016-06-29 DIAGNOSIS — Z6841 Body Mass Index (BMI) 40.0 and over, adult: Secondary | ICD-10-CM

## 2016-06-29 DIAGNOSIS — E785 Hyperlipidemia, unspecified: Secondary | ICD-10-CM | POA: Diagnosis not present

## 2016-06-29 DIAGNOSIS — Z90722 Acquired absence of ovaries, bilateral: Secondary | ICD-10-CM | POA: Diagnosis not present

## 2016-06-29 DIAGNOSIS — F603 Borderline personality disorder: Secondary | ICD-10-CM | POA: Diagnosis not present

## 2016-06-29 DIAGNOSIS — M3320 Polymyositis, organ involvement unspecified: Secondary | ICD-10-CM | POA: Diagnosis not present

## 2016-06-29 DIAGNOSIS — Z87891 Personal history of nicotine dependence: Secondary | ICD-10-CM | POA: Diagnosis not present

## 2016-06-29 DIAGNOSIS — K760 Fatty (change of) liver, not elsewhere classified: Secondary | ICD-10-CM | POA: Diagnosis not present

## 2016-06-29 DIAGNOSIS — G4730 Sleep apnea, unspecified: Secondary | ICD-10-CM

## 2016-06-29 DIAGNOSIS — Z8542 Personal history of malignant neoplasm of other parts of uterus: Secondary | ICD-10-CM | POA: Diagnosis not present

## 2016-06-29 NOTE — Progress Notes (Signed)
Patient here for follow up. Recently had abnormal PAP (HPV) positive, repeat PAP was normal.

## 2016-06-29 NOTE — Progress Notes (Signed)
  Oncology Nurse Navigator Documentation Chaperoned pelvic exam. Will follow up in one year. Navigator Location: [facility] (06/29/16 1400)   )Navigator Encounter Type: Follow-up Appt (06/29/16 1400)                     Patient Visit Type: GynOnc (06/29/16 1400)                              Time Spent with Patient: 15 (06/29/16 1400)

## 2016-06-29 NOTE — Progress Notes (Signed)
Blood pressure recheck 124/84, 64.

## 2016-06-29 NOTE — Progress Notes (Signed)
Gynecologic Oncology Consult Visit   Referring Provider: Dr. Enzo Bi  Chief Concern: Surveillance for stage IA, type I endometrioid endometrial cancer.  Subjective:  Dana Whitaker is a 40 y.o. female who is seen in consultation from Dr. Enzo Bi for endometrial cancer.   She saw Dr. Enzo Bi on 11/10/2015 and had a negative exam. He referred her to the bariatric weight loss program. She had a laparoscopic sleeve gastrectomy with hiatal hernia repair and upper GI endoscopy on 06/13/2016.  She also saw Dr. Enzo Bi who does her Pap smear follow up for HRHPV + Pap on 11/10/2015. He has followed with colposcopy and repeat Pap on 06/21/2016 was NILM.   Her only complaints are a history of vulvar irritation/itching. She self treats with diaper rash ointment as needed.    Gynecologic Oncology History [name] is a pleasant patient with Stage IA, grade 1, type I endometrioid endometrial cancer.   07/2013     EMB reveals a well differentiated endometrioid adenocarcinoma 09/10/2013 TLHBSO for grade 1 endometrioid endometrial cancer. Tumor size 0.4 cm, no myometrial invasion. LVSI not identified.   Clinically NED since.   Problem List: Patient Active Problem List   Diagnosis Date Noted  . Endometrial cancer ([place]) 06/29/2016  . 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 Fatty liver 06/13/2016  . Bilateral carpal tunnel syndrome 06/13/2016  . Status post bariatric surgery 06/13/2016  . Hypertriglyceridemia 05/19/2016  . History of fracture of left ankle 05/19/2016  . Former cigarette smoker 02/17/2016  . Prediabetes 02/17/2016  . Leukocytosis 02/17/2016  . Endometrioid adenocarcinoma of uterus ([place]) 11/10/2015  . Status post laparoscopic hysterectomy 11/10/2015  . Morbid obesity with BMI of 50.0-59.9, adult ([place]) 11/10/2015  . Dermatitis of foot 07/31/2015  . Tinea pedis of right foot 07/20/2015  . Abdominal pain, RUQ (right upper quadrant) 06/18/2015  . Heartburn 06/18/2015  . Aggrieved  01/13/2015  . Borderline personality disorder 01/13/2015  . ADHD (attention deficit hyperactivity disorder), combined type 01/13/2015  . Clinical depression 01/13/2015  . H/O: obesity 01/13/2015  . H/O disease 01/13/2015  . Apnea, sleep 01/13/2015  . Anxiety, generalized 01/13/2015  . Insomnia, persistent 01/13/2015  . Depression, major, recurrent, moderate ([place]) 01/13/2015  . Fibromyalgia 01/13/2015  . Adjustment disorder with depressed mood 01/13/2015  . ADD (attention deficit disorder) 01/13/2015  . Moderate episode of recurrent major depressive disorder ([place]) 01/13/2015  . Major depressive disorder with single episode 01/13/2015  . Polymyositis ([place]) 01/13/2015  . Classical migraine with intractable migraine 09/12/2014  . Extreme obesity ([place]) 09/12/2014  . 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 Morbid obesity ([place]) 09/12/2014    Past Medical History: Past Medical History:  Diagnosis Date  . ADHD (attention deficit hyperactivity disorder)   . Anxiety   . Bipolar disorder ([place])   . Breast pain   . Depression   . Eczema   . Falls   . Fatigue   . Fibromyalgia   . GERD (gastroesophageal reflux disease)   . Headache   . History of bronchitis   . History of sprain of both ankles   . Hyperlipemia   . Obesity   . Postcoital bleeding   . Pre-diabetes   . Sleep apnea    does not use CPAP   . Uterine cancer ([place])   . Vaginal dysplasia   . Varicose veins of lower extremity    bilateral    Past Surgical History: Past Surgical History:  Procedure Laterality Date  . ABDOMINAL HYSTERECTOMY     [place].bso  .  CARPAL TUNNEL RELEASE     pt states did not have surgery just injections   . LAPAROSCOPIC GASTRIC SLEEVE RESECTION N/A 06/13/2016   Procedure: LAPAROSCOPIC GASTRIC SLEEVE RESECTION WITH HIATAL HERNIA REPAIR AND UPPER ENDOSCOPY;  Surgeon: Greer Pickerel, MD;  Location: [facility];  Service: General;  Laterality: N/A;  . LEEP    . TONSILLECTOMY    . VARICOSE VEIN  SURGERY      Past Gynecologic History:  Menarche: 14 History of Abnormal pap: HPV Infection and h/o cervical dysplasia treated wioth a LEEP in 2010, see interval history       OB History: G1P1  Family History: Family History  Problem Relation Age of Onset  . Hyperlipidemia Mother   . Bipolar disorder Mother   . Heart attack Father   . Drug abuse Father   . Anxiety disorder Sister   . Depression Sister   . Anxiety disorder Sister   . Depression Sister   . ADD / ADHD Sister   . Alcohol abuse Sister   . Thyroid disease Maternal Grandmother   . Diabetes Maternal Grandmother   . Stroke Maternal Grandmother   . Ovarian cancer Neg Hx   . Breast cancer Neg Hx   . Colon cancer Neg Hx     Social History: Social History   Social History  . Marital status: Married    Spouse name: N/A  . Number of children: N/A  . Years of education: N/A   Occupational History  . Not on file.   Social History Main Topics  . Smoking status: Former Smoker    Packs/day: 1.00    Years: 23.00    Types: Cigarettes    Start date: 02/05/1994    Quit date: 05/10/2016  . Smokeless tobacco: Never Used  . Alcohol use No  . Drug use: No  . Sexual activity: Yes    Birth control/ protection: None, Surgical   Other Topics Concern  . Not on file   Social History Narrative  . No narrative on file    Allergies: Allergies  Allergen Reactions  . Ciprofloxacin Hives  . Tramadol Rash    Current Medications: Current Outpatient Prescriptions  Medication Sig Dispense Refill  . Calcium Carbonate-Vit D-Min (CALTRATE 600+D PLUS MINERALS) 600-800 MG-UNIT CHEW Chew by mouth.    . Multiple Vitamin (MULTIVITAMIN) capsule Take 1 capsule by mouth daily.    . ondansetron (ZOFRAN-ODT) 4 MG disintegrating tablet   0  . pantoprazole (PROTONIX) 40 MG tablet   0   No current facility-administered medications for this visit.     Review of Systems General: no complaints  HEENT: no complaints  Lungs: no  complaints  Cardiac: no complaints  GI: no complaints  GU: no complaints, she has had vulvar irritation/itching in the past but no current issues.   Musculoskeletal: no complaints  Extremities: no complaints  Skin: no complaints  Neuro: no complaints  Endocrine: no complaints  Psych: no complaints       Objective:  Physical Examination:   BP (!) 141/103 (BP Location: Right Wrist) Comment: not normally high just had gastric sleeve   Pulse 72   Temp 97.7 F (36.5 C) (Oral)   Ht 5\' 4"  (1.626 m)   Wt 275 lb 7.4 oz (125 kg)   LMP 08/04/2013   BMI 47.28 kg/m      ECOG Performance Status: 1 - Symptomatic but completely ambulatory  General appearance: alert, cooperative and appears stated age HEENT:PERRLA, extra ocular movement intact and  sclera clear, anicteric Lymph node survey: non-palpable, axillary, inguinal, supraclavicular Cardiovascular: regular rate and rhythm Respiratory: normal air entry, lungs clear to auscultation Abdomen: soft, non-tender, without masses or organomegaly, no hernias and well healed incision Extremities: extremities normal, atraumatic, no cyanosis or edema and varicose veins noted Neurological exam reveals alert, oriented, normal speech, no focal findings or movement disorder noted.  Pelvic: exam chaperoned by nurse;  Vulva: normal appearing vulva with no masses, tenderness or lesions; Vagina: normal vagina; Adnexa: no masses surgically absent bilateral; Uterus/Cervix: surgically absent, vaginal cuff well healed      Assessment:  Dana Whitaker is a 40 y.o. female diagnosed with Stage 1, grade 1, type I endometroid adenocarcinoma, NED Obesity, s/p gastric sleeve surgery.  Vasomotor symptoms, without complaint today.  Bilateral leg swelling unlikely secondary to surgical lymphedema as no LND was performed s/p varicose vein surgery and injection 2016. High blood pressure, possible secondary to anxiety from today's visit, she is asymptomatic.    Tobacco usage, stopped for surgery.   Plan:   Problem List Items Addressed This Visit      Genitourinary   Endometrial cancer ([place]) - Primary        Continue to alternate surveillance with Dr. Enzo Bi every 6 months. She will RTC in one year with [place]. Her recurrence risk is 1-2%. Dr. Enzo Bi will follow her Pap smears and other well woman routine health care.   We continued to encourage weight loss and continued exercise and congratulated her on her recent 20 pound weight loss.    She will follow up with her PCP regarding blood pressure.   Gillis Ends, MD    CC:  Dr. Enzo Bi

## 2016-07-05 ENCOUNTER — Telehealth (HOSPITAL_COMMUNITY): Payer: Self-pay

## 2016-07-05 NOTE — Telephone Encounter (Signed)
Made discharge phone call to patient per DROP protocol. Asking the following questions.    1. Do you have someone to care for you now that you are home?  Yes 2. Are you having pain now that is not relieved by your pain medication?  No 3. Are you able to drink the recommended daily amount of fluids (48 ounces minimum/day) and protein (60-80 grams/day) as prescribed by the dietitian or nutritional counselor?  Yes 4. Are you taking the vitamins and minerals as prescribed? Yes 5. Do you have the "on call" number to contact your surgeon if you have a problem or question?  Yes 6. Are your incisions free of redness, swelling or drainage? (If steri strips, address that these can fall off, shower as tolerated)  Yes 7. Have your bowels moved since your surgery?  If not, are you passing gas?  Yes 8. Are you up and walking 3-4 times per day?  Yes 9. Were you provided your discharge medications before your surgery or before you were discharged from the hospital and are you taking them without problem?  Yes   Aamna Mallozzi RN

## 2016-08-10 ENCOUNTER — Encounter: Payer: 59 | Attending: General Surgery | Admitting: Dietician

## 2016-08-10 ENCOUNTER — Encounter: Payer: Self-pay | Admitting: Dietician

## 2016-08-10 DIAGNOSIS — Z720 Tobacco use: Secondary | ICD-10-CM | POA: Insufficient documentation

## 2016-08-10 DIAGNOSIS — Z6841 Body Mass Index (BMI) 40.0 and over, adult: Secondary | ICD-10-CM | POA: Diagnosis not present

## 2016-08-10 DIAGNOSIS — E668 Other obesity: Secondary | ICD-10-CM

## 2016-08-10 DIAGNOSIS — E669 Obesity, unspecified: Secondary | ICD-10-CM

## 2016-08-10 NOTE — Patient Instructions (Addendum)
Goals:  Follow Phase 3B: High Protein + Non-Starchy Vegetables  Eat 3-6 small meals/snacks, every 3-5 hrs  Increase lean protein foods to meet 60g goal  Increase fluid intake to 64oz +  Avoid drinking 15 minutes before, during and 30 minutes after eating  Aim for >30 min of physical activity daily  Stick with 15 grams or less of carbs per meal  Surgery date: 06/13/2016 Surgery type: Sleeve gastrectomy [facility] weight at [facility]: 286 lbs on 04/14/16 (highest weight 298 lbs per patient) Weight today: 261 lbs Wt change: 11.8 lbs Total weight lost: 25 lbs (37 lbs)  TANITA  BODY COMP RESULTS  05/30/16 06/28/16 08/10/16   BMI (kg/m^2) 51.2 46.8 44.8   Fat Mass (lbs) 162.4 147 135.2   Fat Free Mass (lbs) 135.6 125.8 125.8   Total Body Water (lbs) 101.4 93.6 93.2

## 2016-08-10 NOTE — Progress Notes (Signed)
  Follow-up visit:  8 Weeks Post-Operative Sleeve gastrectomy Surgery  Medical Nutrition Therapy:  Appt start time: [id number] end time:  1130.  Primary concerns today: Post-operative Bariatric Surgery Nutrition Management. Dana Whitaker returns having lost another 12 pounds. She is proud to have continued not to smoke since November 2017. Sometimes struggles with taking too big of a bite but states she stops as soon as she starts feeling full.   Surgery date: 06/13/2016 Surgery type: Sleeve gastrectomy [facility] weight at [facility]: 286 lbs on 04/14/16 (highest weight 298 lbs per patient) Weight today: 261 lbs Wt change: 11.8 lbs Total weight lost: 25 lbs (37 lbs)  TANITA  BODY COMP RESULTS  05/30/16 06/28/16 08/10/16   BMI (kg/m^2) 51.2 46.8 44.8   Fat Mass (lbs) 162.4 147 135.2   Fat Free Mass (lbs) 135.6 125.8 125.8   Total Body Water (lbs) 101.4 93.6 93.2    Preferred Learning Style:   No preference indicated   Learning Readiness:   Ready  24-hr recall: B (AM): Atkins shake (15g) Snk (AM):   L (PM): beans and tomatoes with beef and cheese (14-21g) Snk (PM):   D (PM): 1.5-2 oz beef or chicken (10-14g) Snk (PM): Atkins shake and rest of dinner (15g)  Fluid intake: 22 oz protein shake, decaf coffee, 34 oz water Estimated total protein intake: 60 g  Medications: see list Supplementation: taking  Using straws: no Drinking while eating: no Hair loss: no more than usual Carbonated beverages: none N/V/D/C: some constipation Dumping syndrome: none  Recent physical activity:  Zumba, elliptical, and weights (2-3x a week)  Progress Towards Goal(s):  In progress.  Handouts given during visit include:  Phase 3B lean protein + non starchy vegetables   Nutritional Diagnosis:  [place]-3.3 Overweight/obesity related to past poor dietary habits and physical inactivity as evidenced by patient w/ recent sleeve gastrectomy surgery following dietary guidelines for continued weight loss.      Intervention:  Nutrition counseling provided.  Teaching Method Utilized:  Visual Auditory Hands on  Barriers to learning/adherence to lifestyle change: none  Demonstrated degree of understanding via:  Teach Back   Monitoring/Evaluation:  Dietary intake, exercise, and body weight. Follow up prn.

## 2016-08-22 ENCOUNTER — Ambulatory Visit (INDEPENDENT_AMBULATORY_CARE_PROVIDER_SITE_OTHER): Payer: 59 | Admitting: Family Medicine

## 2016-08-22 ENCOUNTER — Encounter: Payer: Self-pay | Admitting: Family Medicine

## 2016-08-22 DIAGNOSIS — J111 Influenza due to unidentified influenza virus with other respiratory manifestations: Secondary | ICD-10-CM

## 2016-08-22 DIAGNOSIS — R69 Illness, unspecified: Secondary | ICD-10-CM | POA: Diagnosis not present

## 2016-08-22 MED ORDER — BENZONATATE 200 MG PO CAPS: 200.0000 mg | ORAL_CAPSULE | Freq: Three times a day (TID) | ORAL | 0 refills | Status: DC | PRN

## 2016-08-22 MED ORDER — OSELTAMIVIR PHOSPHATE 75 MG PO CAPS: 75.0000 mg | ORAL_CAPSULE | Freq: Two times a day (BID) | ORAL | 0 refills | Status: AC

## 2016-08-22 NOTE — Progress Notes (Signed)
Name: Dana Whitaker   MRN: [id number]    DOB: 1976-04-22   Date:08/22/2016       Progress Note  Subjective  Chief Complaint  Chief Complaint  Patient presents with  . Acute Visit    Sore throat / cough    Sore Throat   This is a new problem. The current episode started today. There has been no fever. Associated symptoms include coughing. Pertinent negatives include no ear discharge, ear pain, headaches or shortness of breath. Associated symptoms comments: Rhinorrhea, pain in both ears. She has had no exposure to strep. She has tried nothing for the symptoms.    Past Medical History:  Diagnosis Date  . ADHD (attention deficit hyperactivity disorder)   . Anxiety   . Bipolar disorder ([place])   . Breast pain   . Depression   . Eczema   . Falls   . Fatigue   . Fibromyalgia   . GERD (gastroesophageal reflux disease)   . Headache   . History of bronchitis   . History of sprain of both ankles   . Hyperlipemia   . Obesity   . Postcoital bleeding   . Pre-diabetes   . Sleep apnea    does not use CPAP   . Uterine cancer ([place])   . Vaginal dysplasia   . Varicose veins of lower extremity    bilateral    Past Surgical History:  Procedure Laterality Date  . ABDOMINAL HYSTERECTOMY     tah.bso  . CARPAL TUNNEL RELEASE     pt states did not have surgery just injections   . LAPAROSCOPIC GASTRIC SLEEVE RESECTION N/A 06/13/2016   Procedure: LAPAROSCOPIC GASTRIC SLEEVE RESECTION WITH HIATAL HERNIA REPAIR AND UPPER ENDOSCOPY;  Surgeon: Greer Pickerel, MD;  Location: [facility];  Service: General;  Laterality: N/A;  . LEEP    . TONSILLECTOMY    . VARICOSE VEIN SURGERY      Family History  Problem Relation Age of Onset  . Hyperlipidemia Mother   . Bipolar disorder Mother   . Heart attack Father   . Drug abuse Father   . Anxiety disorder Sister   . Depression Sister   . Anxiety disorder Sister   . Depression Sister   . ADD / ADHD Sister   . Alcohol abuse Sister   . Thyroid disease  Maternal Grandmother   . Diabetes Maternal Grandmother   . Stroke Maternal Grandmother   . Ovarian cancer Neg Hx   . Breast cancer Neg Hx   . Colon cancer Neg Hx     Social History   Social History  . Marital status: Married    Spouse name: N/A  . Number of children: N/A  . Years of education: N/A   Occupational History  . Not on file.   Social History Main Topics  . Smoking status: Former Smoker    Packs/day: 1.00    Years: 23.00    Types: Cigarettes    Start date: 02/05/1994    Quit date: 05/10/2016  . Smokeless tobacco: Never Used  . Alcohol use No  . Drug use: No  . Sexual activity: Yes    Birth control/ protection: None, Surgical   Other Topics Concern  . Not on file   Social History Narrative  . No narrative on file     Current Outpatient Prescriptions:  .  Calcium Carbonate-Vit D-Min (CALTRATE 600+D PLUS MINERALS) 600-800 MG-UNIT CHEW, Chew by mouth., Disp: , Rfl:  .  Multiple Vitamin (MULTIVITAMIN) capsule,  Take 1 capsule by mouth daily., Disp: , Rfl:  .  pantoprazole (PROTONIX) 40 MG tablet, , Disp: , Rfl: 0  Allergies  Allergen Reactions  . Ciprofloxacin Hives  . Tramadol Rash     Review of Systems  Constitutional: Positive for chills and malaise/fatigue. Negative for fever and weight loss.  HENT: Positive for sore throat. Negative for ear discharge and ear pain.   Respiratory: Positive for cough. Negative for shortness of breath and wheezing.   Musculoskeletal: Positive for myalgias.  Neurological: Negative for headaches.    Objective  Vitals:   08/22/16 1406  BP: 120/71  Pulse: (!) 110  Resp: 17  Temp: 98.7 F (37.1 C)  TempSrc: Oral  SpO2: 97%  Weight: 260 lb 3.2 oz (118 kg)  Height: 5\' 4"  (1.626 m)    Physical Exam  Constitutional: She is oriented to person, place, and time and well-developed, well-nourished, and in no distress.  HENT:  Right Ear: Tympanic membrane and ear canal normal.  Left Ear: Tympanic membrane and ear  canal normal.  Nose: Right sinus exhibits no maxillary sinus tenderness. Left sinus exhibits no maxillary sinus tenderness and no frontal sinus tenderness.  Mouth/Throat: Posterior oropharyngeal erythema present.  Cardiovascular: Normal rate, regular rhythm, S1 normal, S2 normal and normal heart sounds.   No murmur heard. Pulmonary/Chest: Effort normal and breath sounds normal. She has no wheezes. She has no rhonchi.  Neurological: She is alert and oriented to person, place, and time.  Psychiatric: Mood, memory, affect and judgment normal.  Nursing note and vitals reviewed.      Assessment & Plan  1. Influenza-like illness Suspect influenza based on history and exam, start on Tamiflu for treatment, benzonatate for relief of coughing - oseltamivir (TAMIFLU) 75 MG capsule; Take 1 capsule (75 mg total) by mouth 2 (two) times daily.  Dispense: 10 capsule; Refill: 0 - benzonatate (TESSALON) 200 MG capsule; Take 1 capsule (200 mg total) by mouth 3 (three) times daily as needed for cough.  Dispense: 20 capsule; Refill: 0   Jaymien Landin [facility] 08/22/2016 2:21 PM

## 2016-08-29 ENCOUNTER — Ambulatory Visit: Payer: Self-pay | Admitting: Family Medicine

## 2016-08-30 ENCOUNTER — Encounter: Payer: Self-pay | Admitting: Family Medicine

## 2016-08-30 ENCOUNTER — Ambulatory Visit (INDEPENDENT_AMBULATORY_CARE_PROVIDER_SITE_OTHER): Payer: 59 | Admitting: Family Medicine

## 2016-08-30 DIAGNOSIS — N6002 Solitary cyst of left breast: Secondary | ICD-10-CM | POA: Diagnosis not present

## 2016-08-30 NOTE — Progress Notes (Signed)
Name: Dana Whitaker   MRN: [id number]    DOB: 1975/08/19   Date:08/30/2016       Progress Note  Subjective  Chief Complaint  Chief Complaint  Patient presents with  . Cyst    on left breast    HPI  Pt. Presents for evaluation of a 'cyst' on her left breast, located on the outer side of the breast. This was first noticed a few months ago, it is described as the size of a pea, if she squeezes it, it gets marginally enlarged, there is some pus reported from the cyst if she squeezes it. It is not painful to touch.    Past Medical History:  Diagnosis Date  . ADHD (attention deficit hyperactivity disorder)   . Anxiety   . Bipolar disorder ([place])   . Breast pain   . Depression   . Eczema   . Falls   . Fatigue   . Fibromyalgia   . GERD (gastroesophageal reflux disease)   . Headache   . History of bronchitis   . History of sprain of both ankles   . Hyperlipemia   . Obesity   . Postcoital bleeding   . Pre-diabetes   . Sleep apnea    does not use CPAP   . Uterine cancer ([place])   . Vaginal dysplasia   . Varicose veins of lower extremity    bilateral    Past Surgical History:  Procedure Laterality Date  . ABDOMINAL HYSTERECTOMY     tah.bso  . CARPAL TUNNEL RELEASE     pt states did not have surgery just injections   . LAPAROSCOPIC GASTRIC SLEEVE RESECTION N/A 06/13/2016   Procedure: LAPAROSCOPIC GASTRIC SLEEVE RESECTION WITH HIATAL HERNIA REPAIR AND UPPER ENDOSCOPY;  Surgeon: Greer Pickerel, MD;  Location: [facility];  Service: General;  Laterality: N/A;  . LEEP    . TONSILLECTOMY    . VARICOSE VEIN SURGERY      Family History  Problem Relation Age of Onset  . Hyperlipidemia Mother   . Bipolar disorder Mother   . Heart attack Father   . Drug abuse Father   . Anxiety disorder Sister   . Depression Sister   . Anxiety disorder Sister   . Depression Sister   . ADD / ADHD Sister   . Alcohol abuse Sister   . Thyroid disease Maternal Grandmother   . Diabetes Maternal  Grandmother   . Stroke Maternal Grandmother   . Ovarian cancer Neg Hx   . Breast cancer Neg Hx   . Colon cancer Neg Hx     Social History   Social History  . Marital status: Married    Spouse name: N/A  . Number of children: N/A  . Years of education: N/A   Occupational History  . Not on file.   Social History Main Topics  . Smoking status: Former Smoker    Packs/day: 1.00    Years: 23.00    Types: Cigarettes    Start date: 02/05/1994    Quit date: 05/10/2016  . Smokeless tobacco: Never Used  . Alcohol use No  . Drug use: No  . Sexual activity: Yes    Birth control/ protection: None, Surgical   Other Topics Concern  . Not on file   Social History Narrative  . No narrative on file     Current Outpatient Prescriptions:  .  Calcium Carbonate-Vit D-Min (CALTRATE 600+D PLUS MINERALS) 600-800 MG-UNIT CHEW, Chew by mouth., Disp: , Rfl:  .  Multiple Vitamin (MULTIVITAMIN) capsule, Take 1 capsule by mouth daily., Disp: , Rfl:  .  benzonatate (TESSALON) 200 MG capsule, Take 1 capsule (200 mg total) by mouth 3 (three) times daily as needed for cough. (Patient not taking: Reported on 08/30/2016), Disp: 20 capsule, Rfl: 0 .  pantoprazole (PROTONIX) 40 MG tablet, , Disp: , Rfl: 0  Allergies  Allergen Reactions  . Ciprofloxacin Hives  . Tramadol Rash     ROS  Please see HPI for complete discussion of ROS.  Objective  Vitals:   08/30/16 1148  BP: 120/74  Pulse: 87  Resp: 17  Temp: 98.3 F (36.8 C)  TempSrc: Oral  SpO2: 94%  Weight: 254 lb 11.2 oz (115.5 kg)  Height: 5\' 4"  (1.626 m)    Physical Exam  Constitutional: She is well-developed, well-nourished, and in no distress.  Pulmonary/Chest:    Small round non tender palpable mass on the left breast at 4 o' clock position, no drainage, no surrounding erythema.   Nursing note and vitals reviewed.      Assessment & Plan  1. Cyst of left breast Obtain diagnostic mammogram for evaluation of the breast  cyst. - MM Digital Diagnostic Bilat; Future - MM Digital Diagnostic Unilat R; Future - MM Digital Diagnostic Unilat L; Future - US BREAST COMPLETE UNI LEFT INC AXILLA; Future - US BREAST COMPLETE UNI RIGHT INC AXILLA; Future   Michall Noffke [facility] 08/30/2016 11:58 AM

## 2016-11-03 NOTE — Progress Notes (Addendum)
Patient ID: Dana Whitaker, female   DOB: April 18, 1976, 41 y.o.   MRN: [id number] ANNUAL PREVENTATIVE CARE GYN  ENCOUNTER NOTE  Subjective:       Dana Whitaker is a 41 y.o. G52P1001 female here for a routine annual gynecologic exam.  Current complaints: 1. Vaginal irritation- no d/c- at times  2. 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 She was on estrogen replacement until 2016 when she was having RUQ pain especially after meals. Her oncologist suspected gallstones and suggested discontinuing estrogen. Since then, her pain has resolved. She is no longer having hot flashes or night sweats, but she complains of vaginal dryness, irritation, and redness without any discharge.  She also complains of increased urinary frequency and urgency. Denies dysuria and hematuria.   Status post gastric sleeve surgery in December 2017. Currently she is down almost 50 pounds. She denies nausea, vomiting, or trouble with eating. She is struggling with increased constipation and states she is drinking a lot of protein shakes which seems to worsen the constipation. Denies blood in the stool.   Gynecologic History Patient's last menstrual period was 08/04/2013. Contraception: status post hysterectomy Last Pap: 11/06/2014 NEG/NEG. 06/2016 neg Results were: normal Last mammogram: N/A. 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 Tumor size 0.4 cm, no myometrial invasion. LVSI not identified.   Obstetric History OB History  Gravida Para Term Preterm AB Living  1 1 1     1   SAB TAB Ectopic Multiple Live Births          1    # Outcome Date GA Lbr Len/2nd Weight Sex Delivery Anes PTL Lv  1 Term 1998   7 lb 8 oz (3.402 kg) M Vag-Spont   LIV      Past Medical History:  Diagnosis Date  . ADHD (attention deficit hyperactivity disorder)    . Anxiety   . Bipolar disorder ([place])   . Breast pain   . Depression   . Eczema   . Falls   . Fatigue   . Fibromyalgia   . GERD (gastroesophageal reflux disease)   . Headache   . History of bronchitis   . History of sprain of both ankles   . Hyperlipemia   . Obesity   . Postcoital bleeding   . Pre-diabetes   . Sleep apnea    does not use CPAP   . Uterine cancer ([place])   . Vaginal dysplasia   . Varicose veins of lower extremity    bilateral    Past Surgical History:  Procedure Laterality Date  . ABDOMINAL HYSTERECTOMY     tah.bso  . CARPAL TUNNEL RELEASE     pt states did not have surgery just injections   . LAPAROSCOPIC GASTRIC SLEEVE RESECTION N/A 06/13/2016   Procedure: LAPAROSCOPIC GASTRIC SLEEVE RESECTION WITH HIATAL HERNIA REPAIR AND UPPER ENDOSCOPY;  Surgeon: Greer Pickerel, MD;  Location: [facility];  Service: General;  Laterality: N/A;  . LEEP    . TONSILLECTOMY    . VARICOSE VEIN SURGERY      Current Outpatient Prescriptions on File Prior to Visit  Medication Sig Dispense Refill  . benzonatate (TESSALON) 200 MG capsule Take 1 capsule (200 mg total) by mouth 3 (three) times daily as needed for cough. (Patient not taking: Reported on 08/30/2016) 20 capsule 0  . Calcium Carbonate-Vit D-Min (CALTRATE 600+D PLUS MINERALS) 600-800  MG-UNIT CHEW Chew by mouth.    . Multiple Vitamin (MULTIVITAMIN) capsule Take 1 capsule by mouth daily.    . pantoprazole (PROTONIX) 40 MG tablet   0   No current facility-administered medications on file prior to visit.     Allergies  Allergen Reactions  . Ciprofloxacin Hives  . Tramadol Rash    Social History   Social History  . Marital status: Married    Spouse name: N/A  . Number of children: N/A  . Years of education: N/A   Occupational History  . Not on file.   Social History Main Topics  . Smoking status: Former Smoker    Packs/day: 1.00    Years: 23.00    Types: Cigarettes    Start date: 02/05/1994    Quit date:  05/10/2016  . Smokeless tobacco: Never Used  . Alcohol use No  . Drug use: No  . Sexual activity: Yes    Birth control/ protection: None, Surgical   Other Topics Concern  . Not on file   Social History Narrative  . No narrative on file    Family History  Problem Relation Age of Onset  . Hyperlipidemia Mother   . Bipolar disorder Mother   . Heart attack Father   . Drug abuse Father   . Anxiety disorder Sister   . Depression Sister   . Anxiety disorder Sister   . Depression Sister   . ADD / ADHD Sister   . Alcohol abuse Sister   . Thyroid disease Maternal Grandmother   . Diabetes Maternal Grandmother   . Stroke Maternal Grandmother   . Ovarian cancer Neg Hx   . Breast cancer Neg Hx   . Colon cancer Neg Hx     The following portions of the patient's history were reviewed and updated as appropriate: allergies, current medications, past family history, past medical history, past social history, past surgical history and problem list.  Review of Systems Review of Systems  Constitutional: Negative for chills, fever and malaise/fatigue.  HENT: Negative.   Eyes: Positive for blurred vision. Negative for double vision.  Respiratory: Negative for cough and shortness of breath.   Cardiovascular: Negative for chest pain and palpitations.  Gastrointestinal: Positive for constipation. Negative for abdominal pain, blood in stool, nausea and vomiting.  Genitourinary: Positive for frequency and urgency. Negative for dysuria.  Neurological: Negative for headaches.      Objective:   LMP 08/04/2013  BP 114/76   Pulse 62   Ht 5\' 8"  (1.727 m)   Wt 246 lb 3.2 oz (111.7 kg)   LMP 08/04/2013   BMI 37.43 kg/m   CONSTITUTIONAL: Well-developed, morbidly obese female in no acute distress.  PSYCHIATRIC: Normal mood and affect. Normal behavior. Normal judgment and thought content. [place]: Alert. Normal muscle tone coordination. No cranial nerve deficit noted. HENT:  Normocephalic,  atraumatic, External right and left ear normal. Oropharynx is clear and moist EYES: Conjunctivae and EOM are normal.  No scleral icterus.  NECK: Normal range of motion, supple, no masses.  Normal thyroid.  SKIN: Skin is warm and dry. No rash noted. Not diaphoretic. No erythema. No pallor. CARDIOVASCULAR: Normal heart rate noted, regular rhythm, no murmur. RESPIRATORY: Clear to auscultation bilaterally. Effort and breath sounds normal, no problems with respiration noted. BREASTS: Symmetric in size. No masses, skin changes, nipple drainage, or lymphadenopathy.  ABDOMEN: No abdominal masses.  BLADDER: Normal PELVIC:  External Genitalia: Normal  BUS: Normal  Vagina: vault intact. Good support; fair estrogen  effect  Cervix: Surgically Absent  Uterus: Surgically Absent  Adnexa: Surgically Absent  RV: Normal external exam MUSCULOSKELETAL: Normal range of motion.  No cyanosis or edema.  2+ distal pulses. LYMPHATIC: No Axillary, Supraclavicular Adenopathy.    Assessment:   Annual gynecologic examination 41 y.o. Contraception: status post hysterectomy  Obesity 2- BMI 37, improving since gastric sleeve surgery Endometrioid adenocarcinoma, status post TLHBSO Surgically Menopausal- atrophic vaginitis symptoms Urinary urgency and frequency Constipation    Plan:  Pap: pap w/rflx Mammogram: due age 22 Stool Guaiac Testing:  Not Indicated Labs:  tsh lipid a1c fbs Routine preventative health maintenance measures emphasized: Exercise/Diet/Weight control, Tobacco Warnings, Alcohol/Substance use risks and Stress Management  Start Premarin vaginal cream 2x/week for vaginal dryness UA C&S to rule out infection Encouraged to increase roughage in diet, increase water intake, and add stool softener to help with constipation Return to Clinic - [place], [place], PA-[facility] [employer] 11/10/2016  Brayton Mars, MD   I have seen, interviewed, and examined the  patient in conjunction with the [place].A. student and affirm the diagnosis and management plan. Martin A. DeFrancesco, MD, FACOG   Note: This dictation was prepared with Dragon dictation along with smaller phrase technology. Any transcriptional errors that result from this process are unintentional.

## 2016-11-10 ENCOUNTER — Encounter: Payer: Self-pay | Admitting: Obstetrics and Gynecology

## 2016-11-10 ENCOUNTER — Ambulatory Visit (INDEPENDENT_AMBULATORY_CARE_PROVIDER_SITE_OTHER): Payer: 59 | Admitting: Obstetrics and Gynecology

## 2016-11-10 VITALS — BP 114/76 | HR 62 | Ht 68.0 in | Wt 246.2 lb

## 2016-11-10 DIAGNOSIS — Z01419 Encounter for gynecological examination (general) (routine) without abnormal findings: Secondary | ICD-10-CM

## 2016-11-10 DIAGNOSIS — E6609 Other obesity due to excess calories: Secondary | ICD-10-CM

## 2016-11-10 DIAGNOSIS — Z1231 Encounter for screening mammogram for malignant neoplasm of breast: Secondary | ICD-10-CM

## 2016-11-10 DIAGNOSIS — E8940 Asymptomatic postprocedural ovarian failure: Secondary | ICD-10-CM

## 2016-11-10 DIAGNOSIS — R3915 Urgency of urination: Secondary | ICD-10-CM

## 2016-11-10 DIAGNOSIS — Z9884 Bariatric surgery status: Secondary | ICD-10-CM

## 2016-11-10 DIAGNOSIS — C55 Malignant neoplasm of uterus, part unspecified: Secondary | ICD-10-CM

## 2016-11-10 DIAGNOSIS — Z90710 Acquired absence of both cervix and uterus: Secondary | ICD-10-CM

## 2016-11-10 LAB — POCT URINALYSIS DIPSTICK
BILIRUBIN UA: NEGATIVE
Blood, UA: NEGATIVE
Glucose, UA: NEGATIVE
Ketones, UA: NEGATIVE
LEUKOCYTES UA: NEGATIVE
Nitrite, UA: NEGATIVE
PROTEIN UA: NEGATIVE
Spec Grav, UA: <=1.005 — AB (ref 1.010–1.025)
Urobilinogen, UA: NEGATIVE E.U./dL — AB
pH, UA: 7 (ref 5.0–8.0)

## 2016-11-10 MED ORDER — ESTROGENS, CONJUGATED 0.625 MG/GM VA CREA: 0.2500 | TOPICAL_CREAM | VAGINAL | 2 refills | Status: DC

## 2016-11-10 NOTE — Patient Instructions (Addendum)
1. Pap smear is done 2. Mammogram due at age 41-ordered 3. Screening labs are ordered 4. Continue with healthy eating, exercise, and controlled with loss 5. Return in 1 year for annual exam 6. Begin Premarin vaginal cream twice weekly for vaginal dryness 7. Recommend increasing roughage in diet along with stool softener and increase water intake for constipation management  Health Maintenance, Female Adopting a healthy lifestyle and getting preventive care can go a long way to promote health and wellness. Talk with your health care provider about what schedule of regular examinations is right for you. This is a good chance for you to check in with your provider about disease prevention and staying healthy. In between checkups, there are plenty of things you can do on your own. Experts have done a lot of research about which lifestyle changes and preventive measures are most likely to keep you healthy. Ask your health care provider for more information. Weight and diet Eat a healthy diet  Be sure to include plenty of vegetables, fruits, low-fat dairy products, and lean protein.  Do not eat a lot of foods high in solid fats, added sugars, or salt.  Get regular exercise. This is one of the most important things you can do for your health.  Most adults should exercise for at least 150 minutes each week. The exercise should increase your heart rate and make you sweat (moderate-intensity exercise).  Most adults should also do strengthening exercises at least twice a week. This is in addition to the moderate-intensity exercise. Maintain a healthy weight  Body mass index (BMI) is a measurement that can be used to identify possible weight problems. It estimates body fat based on height and weight. Your health care provider can help determine your BMI and help you achieve or maintain a healthy weight.  For females 54 years of age and older:  A BMI below 18.5 is considered underweight.  A BMI of  18.5 to 24.9 is normal.  A BMI of 25 to 29.9 is considered overweight.  A BMI of 30 and above is considered obese. Watch levels of cholesterol and blood lipids  You should start having your blood tested for lipids and cholesterol at 40 years of age, then have this test every 5 years.  You may need to have your cholesterol levels checked more often if:  Your lipid or cholesterol levels are high.  You are older than 40 years of age.  You are at high risk for heart disease. Cancer screening Lung Cancer  Lung cancer screening is recommended for adults 45-75 years old who are at high risk for lung cancer because of a history of smoking.  A yearly low-dose CT scan of the lungs is recommended for people who:  Currently smoke.  Have quit within the past 15 years.  Have at least a 30-pack-year history of smoking. A pack year is smoking an average of one pack of cigarettes a day for 1 year.  Yearly screening should continue until it has been 15 years since you quit.  Yearly screening should stop if you develop a health problem that would prevent you from having lung cancer treatment. Breast Cancer  Practice breast self-awareness. This means understanding how your breasts normally appear and feel.  It also means doing regular breast self-exams. Let your health care provider know about any changes, no matter how small.  If you are in your 20s or 30s, you should have a clinical breast exam (CBE) by a health care  provider every 1-3 years as part of a regular health exam.  If you are 70 or older, have a CBE every year. Also consider having a breast X-ray (mammogram) every year.  If you have a family history of breast cancer, talk to your health care provider about genetic screening.  If you are at high risk for breast cancer, talk to your health care provider about having an MRI and a mammogram every year.  Breast cancer gene (BRCA) assessment is recommended for women who have family  members with BRCA-related cancers. BRCA-related cancers include:  Breast.  Ovarian.  Tubal.  Peritoneal cancers.  Results of the assessment will determine the need for genetic counseling and BRCA1 and BRCA2 testing. Cervical Cancer  Your health care provider may recommend that you be screened regularly for cancer of the pelvic organs (ovaries, uterus, and vagina). This screening involves a pelvic examination, including checking for microscopic changes to the surface of your cervix (Pap test). You may be encouraged to have this screening done every 3 years, beginning at age 15.  For women ages 12-65, health care providers may recommend pelvic exams and Pap testing every 3 years, or they may recommend the Pap and pelvic exam, combined with testing for human papilloma virus (HPV), every 5 years. Some types of HPV increase your risk of cervical cancer. Testing for HPV may also be done on women of any age with unclear Pap test results.  Other health care providers may not recommend any screening for nonpregnant women who are considered low risk for pelvic cancer and who do not have symptoms. Ask your health care provider if a screening pelvic exam is right for you.  If you have had past treatment for cervical cancer or a condition that could lead to cancer, you need Pap tests and screening for cancer for at least 20 years after your treatment. If Pap tests have been discontinued, your risk factors (such as having a new sexual partner) need to be reassessed to determine if screening should resume. Some women have medical problems that increase the chance of getting cervical cancer. In these cases, your health care provider may recommend more frequent screening and Pap tests. Colorectal Cancer  This type of cancer can be detected and often prevented.  Routine colorectal cancer screening usually begins at 40 years of age and continues through 40 years of age.  Your health care provider may recommend  screening at an earlier age if you have risk factors for colon cancer.  Your health care provider may also recommend using home test kits to check for hidden blood in the stool.  A small camera at the end of a tube can be used to examine your colon directly (sigmoidoscopy or colonoscopy). This is done to check for the earliest forms of colorectal cancer.  Routine screening usually begins at age 46.  Direct examination of the colon should be repeated every 5-10 years through 40 years of age. However, you may need to be screened more often if early forms of precancerous polyps or small growths are found. Skin Cancer  Check your skin from head to toe regularly.  Tell your health care provider about any new moles or changes in moles, especially if there is a change in a mole's shape or color.  Also tell your health care provider if you have a mole that is larger than the size of a pencil eraser.  Always use sunscreen. Apply sunscreen liberally and repeatedly throughout the day.  Protect  yourself by wearing long sleeves, pants, a wide-brimmed hat, and sunglasses whenever you are outside. Heart disease, diabetes, and high blood pressure  High blood pressure causes heart disease and increases the risk of stroke. High blood pressure is more likely to develop in:  People who have blood pressure in the high end of the normal range (130-139/85-89 mm Hg).  People who are overweight or obese.  People who are African American.  If you are 38-46 years of age, have your blood pressure checked every 3-5 years. If you are 34 years of age or older, have your blood pressure checked every year. You should have your blood pressure measured twice-once when you are at a hospital or clinic, and once when you are not at a hospital or clinic. Record the average of the two measurements. To check your blood pressure when you are not at a hospital or clinic, you can use:  An automated blood pressure machine at a  pharmacy.  A home blood pressure monitor.  If you are between 16 years and 18 years old, ask your health care provider if you should take aspirin to prevent strokes.  Have regular diabetes screenings. This involves taking a blood sample to check your fasting blood sugar level.  If you are at a normal weight and have a low risk for diabetes, have this test once every three years after 40 years of age.  If you are overweight and have a high risk for diabetes, consider being tested at a younger age or more often. Preventing infection Hepatitis B  If you have a higher risk for hepatitis B, you should be screened for this virus. You are considered at high risk for hepatitis B if:  You were born in a country where hepatitis B is common. Ask your health care provider which countries are considered high risk.  Your parents were born in a high-risk country, and you have not been immunized against hepatitis B (hepatitis B vaccine).  You have HIV or AIDS.  You use needles to inject street drugs.  You live with someone who has hepatitis B.  You have had sex with someone who has hepatitis B.  You get hemodialysis treatment.  You take certain medicines for conditions, including cancer, organ transplantation, and autoimmune conditions. Hepatitis C  Blood testing is recommended for:  Everyone born from [birth year] through 1964.  Anyone with known risk factors for hepatitis C. Sexually transmitted infections (STIs)  You should be screened for sexually transmitted infections (STIs) including gonorrhea and chlamydia if:  You are sexually active and are younger than 40 years of age.  You are older than 40 years of age and your health care provider tells you that you are at risk for this type of infection.  Your sexual activity has changed since you were last screened and you are at an increased risk for chlamydia or gonorrhea. Ask your health care provider if you are at risk.  If you do not have  HIV, but are at risk, it may be recommended that you take a prescription medicine daily to prevent HIV infection. This is called pre-exposure prophylaxis (PrEP). You are considered at risk if:  You are sexually active and do not regularly use condoms or know the HIV status of your partner(s).  You take drugs by injection.  You are sexually active with a partner who has HIV. Talk with your health care provider about whether you are at high risk of being infected with HIV. If  you choose to begin PrEP, you should first be tested for HIV. You should then be tested every 3 months for as long as you are taking PrEP. Pregnancy  If you are premenopausal and you may become pregnant, ask your health care provider about preconception counseling.  If you may become pregnant, take 400 to 800 micrograms (mcg) of folic acid every day.  If you want to prevent pregnancy, talk to your health care provider about birth control (contraception). Osteoporosis and menopause  Osteoporosis is a disease in which the bones lose minerals and strength with aging. This can result in serious bone fractures. Your risk for osteoporosis can be identified using a bone density scan.  If you are 17 years of age or older, or if you are at risk for osteoporosis and fractures, ask your health care provider if you should be screened.  Ask your health care provider whether you should take a calcium or vitamin D supplement to lower your risk for osteoporosis.  Menopause may have certain physical symptoms and risks.  Hormone replacement therapy may reduce some of these symptoms and risks. Talk to your health care provider about whether hormone replacement therapy is right for you. Follow these instructions at home:  Schedule regular health, dental, and eye exams.  Stay current with your immunizations.  Do not use any tobacco products including cigarettes, chewing tobacco, or electronic cigarettes.  If you are pregnant, do not  drink alcohol.  If you are breastfeeding, limit how much and how often you drink alcohol.  Limit alcohol intake to no more than 1 drink per day for nonpregnant women. One drink equals 12 ounces of beer, 5 ounces of wine, or 1 ounces of hard liquor.  Do not use street drugs.  Do not share needles.  Ask your health care provider for help if you need support or information about quitting drugs.  Tell your health care provider if you often feel depressed.  Tell your health care provider if you have ever been abused or do not feel safe at home. This information is not intended to replace advice given to you by your health care provider. Make sure you discuss any questions you have with your health care provider. Document Released: 01/03/2011 Document Revised: 11/26/2015 Document Reviewed: 03/24/2015 Elsevier Interactive Patient Education  2017 [employer].

## 2016-11-12 LAB — URINE CULTURE

## 2016-11-13 ENCOUNTER — Emergency Department: Payer: 59

## 2016-11-13 ENCOUNTER — Emergency Department
Admission: EM | Admit: 2016-11-13 | Discharge: 2016-11-13 | Disposition: A | Discharge status: Home / Self Care | Payer: 59 | Attending: Student in an Organized Health Care Education/Training Program | Admitting: Student in an Organized Health Care Education/Training Program

## 2016-11-13 ENCOUNTER — Encounter: Payer: Self-pay | Admitting: *Deleted

## 2016-11-13 DIAGNOSIS — Y999 Unspecified external cause status: Secondary | ICD-10-CM | POA: Insufficient documentation

## 2016-11-13 DIAGNOSIS — S99912A Unspecified injury of left ankle, initial encounter: Secondary | ICD-10-CM | POA: Diagnosis present

## 2016-11-13 DIAGNOSIS — W108XXA Fall (on) (from) other stairs and steps, initial encounter: Secondary | ICD-10-CM | POA: Insufficient documentation

## 2016-11-13 DIAGNOSIS — Y9301 Activity, walking, marching and hiking: Secondary | ICD-10-CM | POA: Insufficient documentation

## 2016-11-13 DIAGNOSIS — S93402A Sprain of unspecified ligament of left ankle, initial encounter: Secondary | ICD-10-CM | POA: Insufficient documentation

## 2016-11-13 DIAGNOSIS — Z87891 Personal history of nicotine dependence: Secondary | ICD-10-CM | POA: Insufficient documentation

## 2016-11-13 DIAGNOSIS — Y929 Unspecified place or not applicable: Secondary | ICD-10-CM | POA: Diagnosis not present

## 2016-11-13 DIAGNOSIS — Z79899 Other long term (current) drug therapy: Secondary | ICD-10-CM | POA: Insufficient documentation

## 2016-11-13 NOTE — ED Notes (Signed)
See triage note  States she twisted her left ankle while walking down steps  No swelling noted  Positive pulses

## 2016-11-13 NOTE — ED Triage Notes (Signed)
States she was carrying her dog down the steps and twisted her left ankle, awake and alert in no acute distress

## 2016-11-13 NOTE — ED Notes (Signed)
Back from x-ray  Family at bedside

## 2016-11-13 NOTE — ED Provider Notes (Signed)
[facility] Emergency Department Provider Note  ____________________________________________  Time seen: Approximately 8:43 AM  I have reviewed the triage vital signs and the nursing notes.   HISTORY  Chief Complaint Ankle Pain    HPI Dana Whitaker is a 41 y.o. female that presents to the emergency department with left ankle pain after twisting it on steps this morning. She rolled her ankle inward. Patient was trying to carry her new dog down the steps. She is not having any difficulty moving her toes. No sensation changes. She was able to walk on it from the house to the car. She has sprained this ankle twice previously. She has also sprained her other ankle previously. She denies any additional injuries. She denies shortness of breath, chest pain, nausea, vomiting, abdominal pain.   Past Medical History:  Diagnosis Date  . ADHD (attention deficit hyperactivity disorder)   . Anxiety   . Bipolar disorder ([place])   . Breast pain   . Depression   . Eczema   . Falls   . Fatigue   . Fibromyalgia   . GERD (gastroesophageal reflux disease)   . Headache   . History of bronchitis   . History of sprain of both ankles   . Hyperlipemia   . Obesity   . Postcoital bleeding   . Pre-diabetes   . Sleep apnea    does not use CPAP   . Uterine cancer ([place])   . Vaginal dysplasia   . Varicose veins of lower extremity    bilateral    Patient Active Problem List   Diagnosis Date Noted  . Urgency of urination 11/10/2016  . Surgical menopause 11/10/2016  . Cyst of left breast 08/30/2016  . Influenza-like illness 08/22/2016  . Endometrial cancer ([place]) 06/29/2016  . 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 Fatty liver 06/13/2016  . Bilateral carpal tunnel syndrome 06/13/2016  . Status post bariatric surgery 06/13/2016  . Hypertriglyceridemia 05/19/2016  . History of fracture of left ankle 05/19/2016  . Former cigarette smoker 02/17/2016  . Prediabetes 02/17/2016  .  Leukocytosis 02/17/2016  . 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 Status post laparoscopic hysterectomy 11/10/2015  . Morbid obesity with BMI of 50.0-59.9, adult ([place]) 11/10/2015  . Dermatitis of foot 07/31/2015  . Tinea pedis of right foot 07/20/2015  . Abdominal pain, RUQ (right upper quadrant) 06/18/2015  . Heartburn 06/18/2015  . Aggrieved 01/13/2015  . Borderline personality disorder 01/13/2015  . ADHD (attention deficit hyperactivity disorder), combined type 01/13/2015  . Clinical depression 01/13/2015  . Apnea, sleep 01/13/2015  . Anxiety, generalized 01/13/2015  . Insomnia, persistent 01/13/2015  . Depression, major, recurrent, moderate ([place]) 01/13/2015  . Fibromyalgia 01/13/2015  . Adjustment disorder with depressed mood 01/13/2015  . ADD (attention deficit disorder) 01/13/2015  . Moderate episode of recurrent major depressive disorder ([place]) 01/13/2015  . Major depressive disorder with single episode 01/13/2015  . Polymyositis ([place]) 01/13/2015  . Classical migraine with intractable migraine 09/12/2014  . Class 2 obesity due to excess calories in adult 09/12/2014  . 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 Morbid obesity ([place]) 09/12/2014    Past Surgical History:  Procedure Laterality Date  . ABDOMINAL HYSTERECTOMY     [place].bso  . CARPAL TUNNEL RELEASE     pt states did not have surgery just injections   . LAPAROSCOPIC GASTRIC SLEEVE RESECTION N/A 06/13/2016   Procedure: LAPAROSCOPIC GASTRIC SLEEVE RESECTION WITH HIATAL HERNIA REPAIR AND UPPER ENDOSCOPY;  Surgeon: Greer Pickerel, MD;  Location: [facility] ORS;  Service: General;  Laterality: N/A;  . LEEP    . TONSILLECTOMY    . VARICOSE VEIN SURGERY      Prior to Admission medications   Medication Sig Start Date End Date Taking? Authorizing Provider  Calcium Carbonate-Vit D-Min (CALTRATE 600+D PLUS MINERALS) 600-800 MG-UNIT CHEW Chew by mouth.    [provider]  conjugated estrogens  (PREMARIN) vaginal cream Place 2.35 Applicatorfuls vaginally 2 (two) times a week. 11/10/16   Defrancesco, Alanda Slim, MD  Multiple Vitamin (MULTIVITAMIN) capsule Take 1 capsule by mouth daily.    [provider]    Allergies Ciprofloxacin and Tramadol  Family History  Problem Relation Age of Onset  . Hyperlipidemia Mother   . Bipolar disorder Mother   . Heart attack Father   . Drug abuse Father   . Anxiety disorder Sister   . Depression Sister   . Anxiety disorder Sister   . Depression Sister   . ADD / ADHD Sister   . Alcohol abuse Sister   . Thyroid disease Maternal Grandmother   . Diabetes Maternal Grandmother   . Stroke Maternal Grandmother   . Ovarian cancer Neg Hx   . Breast cancer Neg Hx   . Colon cancer Neg Hx     Social History Social History  Substance Use Topics  . Smoking status: Former Smoker    Packs/day: 1.00    Years: 23.00    Types: Cigarettes    Start date: 02/05/1994    Quit date: 05/10/2016  . Smokeless tobacco: Never Used  . Alcohol use No     Review of Systems  Constitutional: No fever/chills Cardiovascular: No chest pain. Respiratory: No SOB. Gastrointestinal: No abdominal pain.  No nausea, no vomiting.  Musculoskeletal: Positive for ankle pain. Skin: Negative for rash, abrasions, lacerations, ecchymosis. Neurological: Negative for headaches, numbness or tingling   ____________________________________________   PHYSICAL EXAM:  VITAL SIGNS: ED Triage Vitals  Enc Vitals Group     BP 11/13/16 0827 100/66     Pulse Rate 11/13/16 0827 61     Resp 11/13/16 0827 18     Temp --      Temp src --      SpO2 11/13/16 0827 99 %     Weight 11/13/16 0827 246 lb (111.6 kg)     Height 11/13/16 0827 5\' 4"  (1.626 m)     Head Circumference --      Peak Flow --      Pain Score 11/13/16 0819 7     Pain Loc --      Pain Edu? --      Excl. in [place]? --      Constitutional: Alert and oriented. Well appearing and in no acute distress. Eyes:  Conjunctivae are normal. PERRL. EOMI. Head: Atraumatic. ENT:      Ears:      Nose: No congestion/rhinnorhea.      Mouth/Throat: Mucous membranes are moist.  Neck: No stridor.   Cardiovascular: Normal rate, regular rhythm.  Good peripheral circulation.2+ dorsalis pedis pulses. Respiratory: Normal respiratory effort without tachypnea or retractions. Lungs CTAB. Good air entry to the bases with no decreased or absent breath sounds. Musculoskeletal: Swelling and tenderness to palpation over lateral malleolus. Limited range of motion of left ankle. Neurologic:  Normal speech and language. No gross focal neurologic deficits are appreciated. Sensation of toes intact. Skin:  Skin is warm, dry and intact. No rash noted.    ____________________________________________   LABS (all  labs ordered are listed, but only abnormal results are displayed)  Labs Reviewed - No data to display ____________________________________________  EKG   ____________________________________________  RADIOLOGY Robinette Haines, personally viewed and evaluated these images (plain radiographs) as part of my medical decision making, as well as reviewing the written report by the radiologist.  Dg Ankle Complete Left  Result Date: 11/13/2016 CLINICAL DATA:  [place] [address] down deck stairs today holding dog. Pain anterior left ankle without swelling or bruising. Previous left ankle injury with small fx of talus (per patient). EXAM: LEFT ANKLE COMPLETE - 3+ VIEW COMPARISON:  None. FINDINGS: Osseous alignment is normal. Old avulsion fracture fragments noted underlying the medial malleolus. No acute appearing fracture line or displaced fracture fragment. Ankle mortise is symmetric. Soft tissue swelling noted over the medial and lateral malleolus. IMPRESSION: 1. Soft tissue swelling. 2. No acute osseous fracture or dislocation. Electronically Signed   By: Franki Cabot M.D.   On: 11/13/2016 09:01     ____________________________________________    PROCEDURES  Procedure(s) performed:    Procedures    Medications - No data to display   ____________________________________________   INITIAL IMPRESSION / ASSESSMENT AND PLAN / ED COURSE  Pertinent labs & imaging results that were available during my care of the patient were reviewed by me and considered in my medical decision making (see chart for details).  Review of the [place] CSRS was performed in accordance of the [place] prior to dispensing any controlled drugs.     Patient's diagnosis is consistent with ankle sprain. Vital signs and exam are reassuring. No bony abnormalities on x-ray. Ankle was ace wrapped and stirrup splint and crutches were given. Patient was given education on how to use crutches. Patient is to follow up with PCP as directed. Patient is given ED precautions to return to the ED for any worsening or new symptoms.     ____________________________________________  FINAL CLINICAL IMPRESSION(S) / ED DIAGNOSES  Final diagnoses:  Sprain of left ankle, unspecified ligament, initial encounter      NEW MEDICATIONS STARTED DURING THIS VISIT:  Discharge Medication List as of 11/13/2016  9:28 AM          This chart was dictated using voice recognition software/Dragon. Despite best efforts to proofread, errors can occur which can change the meaning. Any change was purely unintentional.    Laban Emperor, PA-C 11/13/16 1244    Merlyn Lot, MD 11/13/16 [id number]

## 2016-11-15 LAB — PAP IG W/ RFLX HPV ASCU: PAP SMEAR COMMENT: 0

## 2016-11-21 ENCOUNTER — Other Ambulatory Visit: Payer: Self-pay

## 2016-12-05 ENCOUNTER — Other Ambulatory Visit: Payer: 59

## 2016-12-05 NOTE — Addendum Note (Signed)
Addended by: Elouise Munroe on: 12/05/2016 10:02 AM   Modules accepted: Orders

## 2016-12-08 ENCOUNTER — Other Ambulatory Visit: Payer: Self-pay

## 2016-12-08 ENCOUNTER — Telehealth: Payer: Self-pay

## 2016-12-08 DIAGNOSIS — N6002 Solitary cyst of left breast: Secondary | ICD-10-CM

## 2016-12-08 LAB — VITAMIN B12: Vitamin B-12: 798 pg/mL (ref 232–1245)

## 2016-12-08 LAB — COMPREHENSIVE METABOLIC PANEL
A/G RATIO: 1.9 (ref 1.2–2.2)
ALT: 15 IU/L (ref 0–32)
AST: 18 IU/L (ref 0–40)
Albumin: 4.4 g/dL (ref 3.5–5.5)
Alkaline Phosphatase: 67 IU/L (ref 39–117)
BILIRUBIN TOTAL: 0.5 mg/dL (ref 0.0–1.2)
BUN/Creatinine Ratio: 23 (ref 9–23)
BUN: 18 mg/dL (ref 6–20)
CO2: 25 mmol/L (ref 18–29)
Calcium: 9.6 mg/dL (ref 8.7–10.2)
Chloride: 101 mmol/L (ref 96–106)
Creatinine, Ser: 0.78 mg/dL (ref 0.57–1.00)
GFR calc non Af Amer: 96 mL/min/{1.73_m2} (ref >59)
GFR, EST AFRICAN AMERICAN: 111 mL/min/{1.73_m2} (ref >59)
Globulin, Total: 2.3 g/dL (ref 1.5–4.5)
Glucose: 79 mg/dL (ref 65–99)
POTASSIUM: 4.3 mmol/L (ref 3.5–5.2)
Sodium: 140 mmol/L (ref 134–144)
Total Protein: 6.7 g/dL (ref 6.0–8.5)

## 2016-12-08 LAB — CBC WITH DIFFERENTIAL/PLATELET
BASOS: 0 %
Basophils Absolute: 0 10*3/uL (ref 0.0–0.2)
EOS (ABSOLUTE): 0.1 10*3/uL (ref 0.0–0.4)
Eos: 2 %
Hematocrit: 41.8 % (ref 34.0–46.6)
Hemoglobin: 13.6 g/dL (ref 11.1–15.9)
Immature Grans (Abs): 0 10*3/uL (ref 0.0–0.1)
Immature Granulocytes: 0 %
Lymphocytes Absolute: 2.1 10*3/uL (ref 0.7–3.1)
Lymphs: 39 %
MCH: 29.8 pg (ref 26.6–33.0)
MCHC: 32.5 g/dL (ref 31.5–35.7)
MCV: 92 fL (ref 79–97)
MONOS ABS: 0.3 10*3/uL (ref 0.1–0.9)
Monocytes: 6 %
NEUTROS PCT: 53 %
Neutrophils Absolute: 2.9 10*3/uL (ref 1.4–7.0)
PLATELETS: 241 10*3/uL (ref 150–379)
RBC: 4.57 x10E6/uL (ref 3.77–5.28)
RDW: 15.5 % — ABNORMAL HIGH (ref 12.3–15.4)
WBC: 5.5 10*3/uL (ref 3.4–10.8)

## 2016-12-08 LAB — HEMOGLOBIN A1C
ESTIMATED AVERAGE GLUCOSE: 117 mg/dL
HEMOGLOBIN A1C: 5.7 % — AB (ref 4.8–5.6)

## 2016-12-08 LAB — LIPID PANEL
CHOL/HDL RATIO: 2.7 ratio (ref 0.0–4.4)
Cholesterol, Total: 202 mg/dL — ABNORMAL HIGH (ref 100–199)
HDL: 74 mg/dL (ref >39)
LDL Calculated: 110 mg/dL — ABNORMAL HIGH (ref 0–99)
Triglycerides: 91 mg/dL (ref 0–149)
VLDL Cholesterol Cal: 18 mg/dL (ref 5–40)

## 2016-12-08 LAB — TSH: TSH: 51.29 u[IU]/mL — AB (ref 0.450–4.500)

## 2016-12-08 LAB — VITAMIN B1: Thiamine: 155.5 nmol/L (ref 66.5–200.0)

## 2016-12-08 LAB — CALCITRIOL (1,25 DI-OH VIT D): VIT D 1 25 DIHYDROXY: 51.1 pg/mL (ref 19.9–79.3)

## 2016-12-08 NOTE — Telephone Encounter (Signed)
Patient has been informed that she has been scheduled to have her mammogram on  12/14/16 @ 2pm at the [facility].

## 2016-12-14 ENCOUNTER — Ambulatory Visit
Admission: RE | Admit: 2016-12-14 | Discharge: 2016-12-14 | Disposition: A | Discharge status: Home / Self Care | Payer: 59 | Source: Ambulatory Visit | Attending: Family Medicine | Admitting: Family Medicine

## 2016-12-14 DIAGNOSIS — N6002 Solitary cyst of left breast: Secondary | ICD-10-CM | POA: Insufficient documentation

## 2017-04-24 ENCOUNTER — Encounter: Payer: Self-pay | Admitting: Family Medicine

## 2017-04-24 ENCOUNTER — Ambulatory Visit (INDEPENDENT_AMBULATORY_CARE_PROVIDER_SITE_OTHER): Payer: 59 | Admitting: Family Medicine

## 2017-04-24 VITALS — BP 122/86 | HR 91 | Temp 97.5°F | Resp 18 | Ht 63.0 in | Wt 256.2 lb

## 2017-04-24 DIAGNOSIS — M549 Dorsalgia, unspecified: Secondary | ICD-10-CM

## 2017-04-24 DIAGNOSIS — J069 Acute upper respiratory infection, unspecified: Secondary | ICD-10-CM | POA: Diagnosis not present

## 2017-04-24 MED ORDER — LEVOCETIRIZINE DIHYDROCHLORIDE 5 MG PO TABS: 5.0000 mg | ORAL_TABLET | Freq: Every evening | ORAL | 0 refills | Status: DC

## 2017-04-24 MED ORDER — AZELASTINE-FLUTICASONE 137-50 MCG/ACT NA SUSP: 2.0000 | Freq: Every day | NASAL | 1 refills | Status: DC

## 2017-04-24 MED ORDER — BENZONATATE 100 MG PO CAPS: 100.0000 mg | ORAL_CAPSULE | Freq: Three times a day (TID) | ORAL | 0 refills | Status: DC | PRN

## 2017-04-24 NOTE — Patient Instructions (Addendum)
Netti Pot or Saline Spray as needed. Drink plenty of water each day. Cool Mist humidifier at night/while resting Tylenol for any body aches or pain. Cool Mist Vaporizer A cool mist vaporizer is a device that releases a cool mist into the air. If you have a cough or a cold, using a vaporizer may help relieve your symptoms. The mist adds moisture to the air, which may help thin your mucus and make it less sticky. When your mucus is thin and less sticky, it easier for you to breathe and to cough up secretions. Do not use a vaporizer if you are allergic to mold. Follow these instructions at home:  Follow the instructions that come with the vaporizer.  Do not use anything other than distilled water in the vaporizer.  Do not run the vaporizer all of the time. Doing that can cause mold or bacteria to grow in the vaporizer.  Clean the vaporizer after each time that you use it.  Clean and dry the vaporizer well before storing it.  Stop using the vaporizer if your breathing symptoms get worse. This information is not intended to replace advice given to you by your health care provider. Make sure you discuss any questions you have with your health care provider. Document Released: 03/17/2004 Document Revised: 01/08/2016 Document Reviewed: 09/19/2015 Elsevier Interactive Patient Education  2018 [place].  Upper Respiratory Infection, Adult Most upper respiratory infections (URIs) are caused by a virus. A URI affects the nose, throat, and upper air passages. The most common type of URI is often called "the common cold." Follow these instructions at home:  Take medicines only as told by your doctor.  Gargle warm saltwater or take cough drops to comfort your throat as told by your doctor.  Use a warm mist humidifier or inhale steam from a shower to increase air moisture. This may make it easier to breathe.  Drink enough fluid to keep your pee (urine) clear or pale yellow.  Eat soups and  other clear broths.  Have a healthy diet.  Rest as needed.  Go back to work when your fever is gone or your doctor says it is okay. ? You may need to stay home longer to avoid giving your URI to others. ? You can also wear a face mask and wash your hands often to prevent spread of the virus.  Use your inhaler more if you have asthma.  Do not use any tobacco products, including cigarettes, chewing tobacco, or electronic cigarettes. If you need help quitting, ask your doctor. Contact a doctor if:  You are getting worse, not better.  Your symptoms are not helped by medicine.  You have chills.  You are getting more short of breath.  You have brown or red mucus.  You have yellow or brown discharge from your nose.  You have pain in your face, especially when you bend forward.  You have a fever.  You have puffy (swollen) neck glands.  You have pain while swallowing.  You have white areas in the back of your throat. Get help right away if:  You have very bad or constant: ? Headache. ? Ear pain. ? Pain in your forehead, behind your eyes, and over your cheekbones (sinus pain). ? Chest pain.  You have long-lasting (chronic) lung disease and any of the following: ? Wheezing. ? Long-lasting cough. ? Coughing up blood. ? A change in your usual mucus.  You have a stiff neck.  You have changes in your: ?  Vision. ? Hearing. ? Thinking. ? Mood. This information is not intended to replace advice given to you by your health care provider. Make sure you discuss any questions you have with your health care provider. Document Released: 12/07/2007 Document Revised: 02/21/2016 Document Reviewed: 09/25/2013 Elsevier Interactive Patient Education  2018 [employer].

## 2017-04-24 NOTE — Progress Notes (Signed)
Name: Dana Whitaker   MRN: [id number]    DOB: [birth year]   Date:04/24/2017       Progress Note  Subjective  Chief Complaint  Chief Complaint  Patient presents with  . URI    cough, congested for 5 days    HPI  PT presents with 4-5 days of URI symptoms -- started with sore/burning in throat, nasal congestion and pressure with frontal headache, watery eyes, bilateral ear pressure.  She also endorses mild cough and some low back pain - thinks she may have pulled a muscle coughing.  Denies chest pain or shortness of breath; no fevers or chills, NVD, abdominal pain.  Has tried Tylenol severe sinus without relief; took Dayquil today without relief.    Patient Active Problem List   Diagnosis Date Noted  . Urgency of urination 11/10/2016  . Surgical menopause 11/10/2016  . Cyst of left breast 08/30/2016  . Influenza-like illness 08/22/2016  . Endometrial cancer ([place]) 06/29/2016  . 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 Fatty liver 06/13/2016  . Bilateral carpal tunnel syndrome 06/13/2016  . Status post bariatric surgery 06/13/2016  . Hypertriglyceridemia 05/19/2016  . History of fracture of left ankle 05/19/2016  . Former cigarette smoker 02/17/2016  . Prediabetes 02/17/2016  . Leukocytosis 02/17/2016  . 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 Status post laparoscopic hysterectomy 11/10/2015  . Morbid obesity with BMI of 50.0-59.9, adult ([place]) 11/10/2015  . Dermatitis of foot 07/31/2015  . Tinea pedis of right foot 07/20/2015  . Abdominal pain, RUQ (right upper quadrant) 06/18/2015  . Heartburn 06/18/2015  . Aggrieved 01/13/2015  . Borderline personality disorder ([place]) 01/13/2015  . ADHD (attention deficit hyperactivity disorder), combined type 01/13/2015  . Clinical depression 01/13/2015  . Apnea, sleep 01/13/2015  . Anxiety, generalized 01/13/2015  . Insomnia, persistent 01/13/2015  . Depression, major, recurrent, moderate ([place]) 01/13/2015  . Fibromyalgia 01/13/2015   . Adjustment disorder with depressed mood 01/13/2015  . ADD (attention deficit disorder) 01/13/2015  . Moderate episode of recurrent major depressive disorder ([place]) 01/13/2015  . Major depressive disorder with single episode 01/13/2015  . Polymyositis ([place]) 01/13/2015  . Classical migraine with intractable migraine 09/12/2014  . Class 2 obesity due to excess calories in adult 09/12/2014  . 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 Morbid obesity ([place]) 09/12/2014    Social History  Substance Use Topics  . Smoking status: Former Smoker    Packs/day: 1.00    Years: 23.00    Types: Cigarettes    Start date: 02/05/1994    Quit date: 05/10/2016  . Smokeless tobacco: Never Used  . Alcohol use No    Current Outpatient Prescriptions:  .  Calcium Carbonate-Vit D-Min (CALTRATE 600+D PLUS MINERALS) 600-800 MG-UNIT CHEW, Chew by mouth., Disp: , Rfl:  .  Multiple Vitamin (MULTIVITAMIN) capsule, Take 1 capsule by mouth daily., Disp: , Rfl:  .  conjugated estrogens (PREMARIN) vaginal cream, Place 3.82 Applicatorfuls vaginally 2 (two) times a week. (Patient not taking: Reported on 04/24/2017), Disp: 42.5 g, Rfl: 2  Allergies  Allergen Reactions  . Ciprofloxacin Hives  . Tramadol Rash    ROS  Ten systems reviewed and is negative except as mentioned in HPI  Objective  Vitals:   04/24/17 1354  BP: 122/86  Pulse: 91  Resp: 18  Temp: (!) 97.5 F (36.4 C)  TempSrc: Oral  SpO2: 97%  Weight: 256 lb 3.2 oz (116.2 kg)  Height: 5\' 3"  (1.6 m)   Body mass index is 45.38 kg/m.  Nursing Note and Vital Signs reviewed.  Physical Exam  Constitutional: Patient appears well-developed and well-nourished. Obese No distress.  HEENT: head atraumatic, normocephalic, pupils equal and reactive to light, EOM's intact, TM's without erythema or bulging, mild maxillary and frontal sinus tenderness on palpation, neck supple without lymphadenopathy, oropharynx pink and moist without  exudate Cardiovascular: Normal rate, regular rhythm, S1/S2 present.  No murmur or rub heard. No BLE edema. Pulmonary/Chest: Effort normal and breath sounds clear. No respiratory distress or retractions. Abdominal: Soft and non-tender, bowel sounds present x4 quadrants. Psychiatric: Patient has a normal mood and affect. behavior is normal. Judgment and thought content normal. MSK: Non-tender low back, full AROM; no gait issues, no joint effusions.  No results found for this or any previous visit (from the past 2160 hour(s)).  Assessment & Plan  1. Viral upper respiratory illness - Azelastine-Fluticasone 137-50 MCG/ACT SUSP; Place 2 sprays into the nose daily.  Dispense: 23 g; Refill: 1 - levocetirizine (XYZAL) 5 MG tablet; Take 1 tablet (5 mg total) by mouth every evening.  Dispense: 20 tablet; Refill: 0 - benzonatate (TESSALON PERLES) 100 MG capsule; Take 1 capsule (100 mg total) by mouth 3 (three) times daily as needed for cough.  Dispense: 20 capsule; Refill: 0  2. Mid back pain on right side - She will call back if not improving and we will consider Chest Xray  -Red flags and when to present for emergency care or RTC including fever >101.50F, chest pain, shortness of breath, pain with occular movement, NVD, new/worsening/un-resolving symptoms, reviewed with patient at time of visit. Follow up and care instructions discussed and provided in AVS.

## 2017-06-28 ENCOUNTER — Inpatient Hospital Stay: Payer: 59

## 2017-06-28 NOTE — Progress Notes (Deleted)
Gynecologic Oncology interval Visit   Referring Provider: Dr. Enzo Bi  Chief Concern: Surveillance for stage IA, type I endometrioid endometrial cancer.  Subjective:  Dana Whitaker is a 41 y.o. female who is seen in consultation from Dr. Enzo Bi for endometrial cancer.   She saw Dr. Enzo Bi on 11/18/2016 and had a negative exam.   Her only complaints are a history of vulvar irritation/itching. She self treats with diaper rash ointment as needed.    Gynecologic Oncology History [name] is a pleasant patient with Stage IA, grade 1, type I endometrioid endometrial cancer.   07/2013     EMB reveals a well differentiated endometrioid adenocarcinoma 09/10/2013 [facility] for grade 1 endometrioid endometrial cancer. Tumor size 0.4 cm, no myometrial invasion. LVSI not identified.   She saw Dr. Enzo Bi on 11/10/2015 and had a negative exam. He referred her to the bariatric weight loss program. She had a laparoscopic sleeve gastrectomy with hiatal hernia repair and upper GI endoscopy on 06/13/2016.  She also saw Dr. Enzo Bi who does her Pap smear follow up for HRHPV + Pap on 11/10/2015. He has followed with colposcopy and repeat Pap on 06/21/2016 was NILM.   Clinically NED since.   Problem List: Patient Active Problem List   Diagnosis Date Noted  . Urgency of urination 11/10/2016  . Surgical menopause 11/10/2016  . Cyst of left breast 08/30/2016  . Influenza-like illness 08/22/2016  . Endometrial cancer ([place]) 06/29/2016  . 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 Fatty liver 06/13/2016  . Bilateral carpal tunnel syndrome 06/13/2016  . Status post bariatric surgery 06/13/2016  . Hypertriglyceridemia 05/19/2016  . History of fracture of left ankle 05/19/2016  . Former cigarette smoker 02/17/2016  . Prediabetes 02/17/2016  . Leukocytosis 02/17/2016  . 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 Status post laparoscopic hysterectomy 11/10/2015  . Morbid obesity with BMI  of 50.0-59.9, adult ([place]) 11/10/2015  . Dermatitis of foot 07/31/2015  . Tinea pedis of right foot 07/20/2015  . Abdominal pain, RUQ (right upper quadrant) 06/18/2015  . Heartburn 06/18/2015  . Aggrieved 01/13/2015  . Borderline personality disorder ([place]) 01/13/2015  . ADHD (attention deficit hyperactivity disorder), combined type 01/13/2015  . Clinical depression 01/13/2015  . Apnea, sleep 01/13/2015  . Anxiety, generalized 01/13/2015  . Insomnia, persistent 01/13/2015  . Depression, major, recurrent, moderate ([place]) 01/13/2015  . Fibromyalgia 01/13/2015  . Adjustment disorder with depressed mood 01/13/2015  . ADD (attention deficit disorder) 01/13/2015  . Moderate episode of recurrent major depressive disorder ([place]) 01/13/2015  . Major depressive disorder with single episode 01/13/2015  . Polymyositis ([place]) 01/13/2015  . Classical migraine with intractable migraine 09/12/2014  . Class 2 obesity due to excess calories in adult 09/12/2014  . 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 Morbid obesity ([place]) 09/12/2014    Past Medical History: Past Medical History:  Diagnosis Date  . ADHD (attention deficit hyperactivity disorder)   . Anxiety   . Bipolar disorder ([place])   . Breast pain   . Depression   . Eczema   . Falls   . Fatigue   . Fibromyalgia   . GERD (gastroesophageal reflux disease)   . Headache   . History of bronchitis   . History of sprain of both ankles   . Hyperlipemia   . Obesity   . Postcoital bleeding   . Pre-diabetes   . Sleep apnea    does not use CPAP   . Uterine cancer ([place])   . Vaginal dysplasia   .  Varicose veins of lower extremity    bilateral    Past Surgical History: Past Surgical History:  Procedure Laterality Date  . ABDOMINAL HYSTERECTOMY     [place].bso  . CARPAL TUNNEL RELEASE     pt states did not have surgery just injections   . LAPAROSCOPIC GASTRIC SLEEVE RESECTION N/A 06/13/2016   Procedure: LAPAROSCOPIC GASTRIC SLEEVE  RESECTION WITH HIATAL HERNIA REPAIR AND UPPER ENDOSCOPY;  Surgeon: Greer Pickerel, MD;  Location: [facility];  Service: General;  Laterality: N/A;  . LEEP    . TONSILLECTOMY    . VARICOSE VEIN SURGERY      Past Gynecologic History:  Menarche: 14 History of Abnormal pap: HPV Infection and h/o cervical dysplasia treated wioth a LEEP in 2010, see interval history       OB History: G1P1  Family History: Family History  Problem Relation Age of Onset  . Hyperlipidemia Mother   . Bipolar disorder Mother   . Heart attack Father   . Drug abuse Father   . Anxiety disorder Sister   . Depression Sister   . Anxiety disorder Sister   . Depression Sister   . ADD / ADHD Sister   . Alcohol abuse Sister   . Thyroid disease Maternal Grandmother   . Diabetes Maternal Grandmother   . Stroke Maternal Grandmother   . Ovarian cancer Neg Hx   . Breast cancer Neg Hx   . Colon cancer Neg Hx     Social History: Social History   Socioeconomic History  . Marital status: Married    Spouse name: Not on file  . Number of children: Not on file  . Years of education: Not on file  . Highest education level: Not on file  Social Needs  . Financial resource strain: Not on file  . Food insecurity - worry: Not on file  . Food insecurity - inability: Not on file  . Transportation needs - medical: Not on file  . Transportation needs - non-medical: Not on file  Occupational History  . Not on file  Tobacco Use  . Smoking status: Former Smoker    Packs/day: 1.00    Years: 23.00    Pack years: 23.00    Types: Cigarettes    Start date: 02/05/1994    Last attempt to quit: 05/10/2016    Years since quitting: 1.1  . Smokeless tobacco: Never Used  Substance and Sexual Activity  . Alcohol use: No    Alcohol/week: 0.0 oz  . Drug use: No  . Sexual activity: Yes    Birth control/protection: Surgical  Other Topics Concern  . Not on file  Social History Narrative  . Not on file    Allergies: Allergies   Allergen Reactions  . Ciprofloxacin Hives  . Tramadol Rash    Current Medications: Current Outpatient Medications  Medication Sig Dispense Refill  . Azelastine-Fluticasone 137-50 MCG/ACT SUSP Place 2 sprays into the nose daily. 23 g 1  . benzonatate (TESSALON PERLES) 100 MG capsule Take 1 capsule (100 mg total) by mouth 3 (three) times daily as needed for cough. 20 capsule 0  . Calcium Carbonate-Vit D-Min (CALTRATE 600+D PLUS MINERALS) 600-800 MG-UNIT CHEW Chew by mouth.    . levocetirizine (XYZAL) 5 MG tablet Take 1 tablet (5 mg total) by mouth every evening. 20 tablet 0  . Multiple Vitamin (MULTIVITAMIN) capsule Take 1 capsule by mouth daily.     No current facility-administered medications for this visit.     Review of Systems  General: no complaints  HEENT: no complaints  Lungs: no complaints  Cardiac: no complaints  GI: no complaints  GU: no complaints, she has had vulvar irritation/itching in the past but no current issues.   Musculoskeletal: no complaints  Extremities: no complaints  Skin: no complaints  Neuro: no complaints  Endocrine: no complaints  Psych: no complaints       Objective:  Physical Examination:   LMP 08/04/2013      ECOG Performance Status: 1 - Symptomatic but completely ambulatory  General appearance: alert, cooperative and appears stated age HEENT:PERRLA, extra ocular movement intact and sclera clear, anicteric Lymph node survey: non-palpable, axillary, inguinal, supraclavicular Cardiovascular: regular rate and rhythm Respiratory: normal air entry, lungs clear to auscultation Abdomen: soft, non-tender, without masses or organomegaly, no hernias and well healed incision Extremities: extremities normal, atraumatic, no cyanosis or edema and varicose veins noted Neurological exam reveals alert, oriented, normal speech, no focal findings or movement disorder noted.  Pelvic: exam chaperoned by nurse;  Vulva: normal appearing vulva with no  masses, tenderness or lesions; Vagina: normal vagina; Adnexa: no masses surgically absent bilateral; Uterus/Cervix: surgically absent, vaginal cuff well healed      Assessment:  Dana Whitaker is a 41 y.o. female diagnosed with Stage 1, grade 1, type I endometroid adenocarcinoma, NED Obesity, s/p gastric sleeve surgery.  Vasomotor symptoms, without complaint today.  Bilateral leg swelling unlikely secondary to surgical lymphedema as no LND was performed s/p varicose vein surgery and injection 2016. High blood pressure, possible secondary to anxiety from today's visit, she is asymptomatic.   Tobacco usage, stopped for surgery.   Plan:   Problem List Items Addressed This Visit    None        Continue to alternate surveillance with Dr. Enzo Bi every 6 months. She will RTC in one year with [place]. Her recurrence risk is 1-2%. Dr. Enzo Bi will follow her Pap smears and other well woman routine health care.   We continued to encourage weight loss and continued exercise and congratulated her on her recent 20 pound weight loss.    She will follow up with her PCP regarding blood pressure.   Gillis Ends, MD    CC:  Dr. Enzo Bi

## 2017-07-04 NOTE — Progress Notes (Signed)
Gynecologic Oncology Consult Visit   Referring Provider: Dr. Enzo Bi  Chief Concern: Surveillance for stage IA, type I endometrioid endometrial cancer.  Subjective:  Dana Whitaker is a 42 y.o. female who is seen in consultation from Dr. Enzo Bi for endometrial cancer.   She saw Dr. Enzo Bi on 11/2016 and had a negative exam and Pap. He had previously referred her to the bariatric weight loss program. She had a laparoscopic sleeve gastrectomy with hiatal hernia repair and upper GI endoscopy on 06/13/2016.    No complaints other than weight gain since August. She had a TSH test 12/05/2016 that was elevated 51.290  Gynecologic Oncology History [name] is a pleasant patient with Stage IA, grade 1, type I endometrioid endometrial cancer.   07/2013     EMB reveals a well differentiated endometrioid adenocarcinoma 09/10/2013 TLHBSO for grade 1 endometrioid endometrial cancer. Tumor size 0.4 cm, no myometrial invasion. LVSI not identified.   Clinically NED since.   Dr. Enzo Bi has obtained Pap smear follow up for HRHPV + Pap on 11/10/2015. Followed with colposcopy and repeat Pap on 06/21/2016 was NILM.   Problem List: Patient Active Problem List   Diagnosis Date Noted  . Urgency of urination 11/10/2016  . Surgical menopause 11/10/2016  . Cyst of left breast 08/30/2016  . Influenza-like illness 08/22/2016  . Endometrial cancer ([place]) 06/29/2016  . 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 Fatty liver 06/13/2016  . Bilateral carpal tunnel syndrome 06/13/2016  . Status post bariatric surgery 06/13/2016  . Hypertriglyceridemia 05/19/2016  . History of fracture of left ankle 05/19/2016  . Former cigarette smoker 02/17/2016  . Prediabetes 02/17/2016  . Leukocytosis 02/17/2016  . Endometrioid adenocarcinoma of uterus ([place]) 11/10/2015  . Status post laparoscopic hysterectomy 11/10/2015  . Morbid obesity with BMI of 50.0-59.9, adult ([place]) 11/10/2015  . Dermatitis of foot 07/31/2015  .  Tinea pedis of right foot 07/20/2015  . Abdominal pain, RUQ (right upper quadrant) 06/18/2015  . Heartburn 06/18/2015  . Aggrieved 01/13/2015  . Borderline personality disorder ([place]) 01/13/2015  . ADHD (attention deficit hyperactivity disorder), combined type 01/13/2015  . Clinical depression 01/13/2015  . Apnea, sleep 01/13/2015  . Anxiety, generalized 01/13/2015  . Insomnia, persistent 01/13/2015  . Depression, major, recurrent, moderate ([place]) 01/13/2015  . Fibromyalgia 01/13/2015  . Adjustment disorder with depressed mood 01/13/2015  . ADD (attention deficit disorder) 01/13/2015  . Moderate episode of recurrent major depressive disorder ([place]) 01/13/2015  . Major depressive disorder with single episode 01/13/2015  . Polymyositis ([place]) 01/13/2015  . Classical migraine with intractable migraine 09/12/2014  . Class 2 obesity due to excess calories in adult 09/12/2014  . 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 Morbid obesity ([place]) 09/12/2014    Past Medical History: Past Medical History:  Diagnosis Date  . ADHD (attention deficit hyperactivity disorder)   . Anxiety   . Bipolar disorder ([place])   . Breast pain   . Depression   . Eczema   . Falls   . Fatigue   . Fibromyalgia   . GERD (gastroesophageal reflux disease)   . Headache   . History of bronchitis   . History of sprain of both ankles   . Hyperlipemia   . Obesity   . Postcoital bleeding   . Pre-diabetes   . Sleep apnea    does not use CPAP   . Uterine cancer ([place])   . Vaginal dysplasia   . Varicose veins of lower extremity    bilateral    Past Surgical History:  Past Surgical History:  Procedure Laterality Date  . ABDOMINAL HYSTERECTOMY     tah.bso  . CARPAL TUNNEL RELEASE     pt states did not have surgery just injections   . LAPAROSCOPIC GASTRIC SLEEVE RESECTION N/A 06/13/2016   Procedure: LAPAROSCOPIC GASTRIC SLEEVE RESECTION WITH HIATAL HERNIA REPAIR AND UPPER ENDOSCOPY;  Surgeon: Greer Pickerel,  MD;  Location: [facility];  Service: General;  Laterality: N/A;  . LEEP    . TONSILLECTOMY    . VARICOSE VEIN SURGERY      Past Gynecologic History:  Menarche: 14 History of Abnormal pap: HPV Infection and h/o cervical dysplasia treated wioth a LEEP in 2010, see interval history       OB History: G1P1  Family History: Family History  Problem Relation Age of Onset  . Hyperlipidemia Mother   . Bipolar disorder Mother   . Heart attack Father   . Drug abuse Father   . Anxiety disorder Sister   . Depression Sister   . Anxiety disorder Sister   . Depression Sister   . ADD / ADHD Sister   . Alcohol abuse Sister   . Thyroid disease Maternal Grandmother   . Diabetes Maternal Grandmother   . Stroke Maternal Grandmother   . Ovarian cancer Neg Hx   . Breast cancer Neg Hx   . Colon cancer Neg Hx     Social History: Social History   Socioeconomic History  . Marital status: Married    Spouse name: Not on file  . Number of children: Not on file  . Years of education: Not on file  . Highest education level: Not on file  Social Needs  . Financial resource strain: Not on file  . Food insecurity - worry: Not on file  . Food insecurity - inability: Not on file  . Transportation needs - medical: Not on file  . Transportation needs - non-medical: Not on file  Occupational History  . Not on file  Tobacco Use  . Smoking status: Former Smoker    Packs/day: 1.00    Years: 23.00    Pack years: 23.00    Types: Cigarettes    Start date: 02/05/1994    Last attempt to quit: 05/10/2016    Years since quitting: 1.1  . Smokeless tobacco: Never Used  Substance and Sexual Activity  . Alcohol use: No    Alcohol/week: 0.0 oz  . Drug use: No  . Sexual activity: Yes    Birth control/protection: Surgical  Other Topics Concern  . Not on file  Social History Narrative  . Not on file    Allergies: Allergies  Allergen Reactions  . Ciprofloxacin Hives  . Tramadol Rash    Current  Medications: Current Outpatient Medications  Medication Sig Dispense Refill  . Calcium Carbonate-Vit D-Min (CALTRATE 600+D PLUS MINERALS) 600-800 MG-UNIT CHEW Chew by mouth.    . Multiple Vitamin (MULTIVITAMIN) capsule Take 1 capsule by mouth daily.    . Azelastine-Fluticasone 137-50 MCG/ACT SUSP Place 2 sprays into the nose daily. 23 g 1  . benzonatate (TESSALON PERLES) 100 MG capsule Take 1 capsule (100 mg total) by mouth 3 (three) times daily as needed for cough. 20 capsule 0  . levocetirizine (XYZAL) 5 MG tablet Take 1 tablet (5 mg total) by mouth every evening. 20 tablet 0   No current facility-administered medications for this visit.     Review of Systems General: no complaints  HEENT: no complaints  Lungs: no complaints  Cardiac: no complaints  GI: no complaints  GU: no complaints  Musculoskeletal: no complaints  Extremities: no complaints  Skin: no complaints  Neuro: no complaints  Endocrine: no complaints  Psych: no complaints       Objective:  Physical Examination:   BP 104/72   Pulse 73   Temp (!) 97.3 F (36.3 C) (Tympanic)   Resp 18   Ht _0  (1.6 m)   Wt 259 lb (117.5 kg)   LMP 08/04/2013   BMI 45.88 kg/m      ECOG Performance Status: 1 - Symptomatic but completely ambulatory  General appearance: alert, cooperative and appears stated age HEENT:PERRLA, extra ocular movement intact and sclera clear, anicteric Lymph node survey: non-palpable, axillary, inguinal, supraclavicular Cardiovascular: regular rate and rhythm Respiratory: normal air entry, lungs clear to auscultation Abdomen: soft, non-tender, without masses or organomegaly, no hernias and well healed incision Extremities: extremities normal, atraumatic, no cyanosis or edema and varicose veins noted Neurological exam reveals alert, oriented, normal speech, no focal findings or movement disorder noted.  Pelvic: exam chaperoned by nurse;  Vulva: normal appearing vulva with no masses,  tenderness or lesions; Vagina: normal vagina; Adnexa: no masses surgically absent bilateral; Uterus/Cervix: surgically absent, vaginal cuff well healed      Assessment:  Dana Whitaker is a 42 y.o. female diagnosed with Stage 1, grade 1, type I endometroid adenocarcinoma, NED Obesity, s/p gastric sleeve surgery.   Bilateral leg swelling unlikely secondary to surgical lymphedema as no LND was performed s/p varicose vein surgery and injection 2016.  Weight gain in setting of elevated TSH concerning for hypothyroidism.   Tobacco usage, stopped for surgery.   Plan:   Problem List Items Addressed This Visit      Genitourinary   Endometrial cancer ([place]) - Primary    Other Visit Diagnoses    Other specified hypothyroidism            Continue to alternate surveillance with Dr. Enzo Bi every 6 months. She will RTC in one year with [place] in 09/2019 if she is NED she can be released from clinic. Her recurrence risk is 1-2%.   Discussed mismatch repair protein testing for screening Lynch syndrome and she would like to proceed.   Weight gain in setting of elevated TSH concerning for hypothyroidism- we recommended she follow up with her PCP for discussion of abnormal test and treatment.     Gillis Ends, MD    CC:  Dr. Enzo Bi

## 2017-07-05 ENCOUNTER — Telehealth: Payer: Self-pay | Admitting: Obstetrics and Gynecology

## 2017-07-05 ENCOUNTER — Inpatient Hospital Stay: Payer: 59 | Attending: Obstetrics and Gynecology | Admitting: Obstetrics and Gynecology

## 2017-07-05 ENCOUNTER — Telehealth: Payer: Self-pay

## 2017-07-05 VITALS — BP 104/72 | HR 73 | Temp 97.3°F | Resp 18 | Ht 63.0 in | Wt 259.0 lb

## 2017-07-05 DIAGNOSIS — G4730 Sleep apnea, unspecified: Secondary | ICD-10-CM | POA: Diagnosis not present

## 2017-07-05 DIAGNOSIS — Z87891 Personal history of nicotine dependence: Secondary | ICD-10-CM

## 2017-07-05 DIAGNOSIS — E038 Other specified hypothyroidism: Secondary | ICD-10-CM | POA: Insufficient documentation

## 2017-07-05 DIAGNOSIS — Z90710 Acquired absence of both cervix and uterus: Secondary | ICD-10-CM | POA: Diagnosis not present

## 2017-07-05 DIAGNOSIS — F603 Borderline personality disorder: Secondary | ICD-10-CM | POA: Diagnosis not present

## 2017-07-05 DIAGNOSIS — F4321 Adjustment disorder with depressed mood: Secondary | ICD-10-CM

## 2017-07-05 DIAGNOSIS — A630 Anogenital (venereal) warts: Secondary | ICD-10-CM | POA: Diagnosis not present

## 2017-07-05 DIAGNOSIS — Z90722 Acquired absence of ovaries, bilateral: Secondary | ICD-10-CM

## 2017-07-05 DIAGNOSIS — Z79899 Other long term (current) drug therapy: Secondary | ICD-10-CM | POA: Diagnosis not present

## 2017-07-05 DIAGNOSIS — M797 Fibromyalgia: Secondary | ICD-10-CM | POA: Diagnosis not present

## 2017-07-05 DIAGNOSIS — Z9884 Bariatric surgery status: Secondary | ICD-10-CM | POA: Diagnosis not present

## 2017-07-05 DIAGNOSIS — C541 Malignant neoplasm of endometrium: Secondary | ICD-10-CM | POA: Insufficient documentation

## 2017-07-05 DIAGNOSIS — F909 Attention-deficit hyperactivity disorder, unspecified type: Secondary | ICD-10-CM | POA: Diagnosis not present

## 2017-07-05 DIAGNOSIS — E781 Pure hyperglyceridemia: Secondary | ICD-10-CM | POA: Diagnosis not present

## 2017-07-05 NOTE — Telephone Encounter (Signed)
The patient would like for [employer] to give her a call as soon as possible in regards to her finding out that she needs thyroid medication. Please advise.

## 2017-07-05 NOTE — Telephone Encounter (Signed)
Copied from [place] [id number]. Topic: Inquiry >> Jul 05, 2017  9:34 AM Corie Chiquito, NT wrote: Reason for HXT:[id number] calling because she was seen at a different doctors office today and was informed that her thyroid levels were abnormal from lab work that she had done over  the summer. Patient stated that she was never told this by anyone in the office and would like to speak with someone about this. She would like to speak with someone from the office and can be reached at [phone]

## 2017-07-05 NOTE — Progress Notes (Signed)
Pt has no gyn complaints, she has cold sx today.

## 2017-07-05 NOTE — Telephone Encounter (Signed)
Spoke to patient and she states she was never told about her TSH being so high. She was notified by her oncology doctor but the labs were drawn by her GYN doctor.She asked did Dr.Shah get the results and I mention to patient I do not see that he did and I was not working with him at the time but I don't see it was routed to [place]. Pt is upset and wanting to report this matter. I suggest to speak with the office manager at Dr. Keturah Barre office about her concerns. I also suggest to schedule her an apt to see Dr. Manuella Ghazi due to her high levels of TSH. Pt agreed and schedule an apt for 07/13/2017 @ 4pm.

## 2017-07-06 ENCOUNTER — Other Ambulatory Visit: Payer: Self-pay

## 2017-07-06 DIAGNOSIS — R7989 Other specified abnormal findings of blood chemistry: Secondary | ICD-10-CM

## 2017-07-06 NOTE — Telephone Encounter (Signed)
Pt aware we missed seeing the tsh. Thyroid panel with tsh ordered. Pt to have drawn tomorrow. Will call with the results. She will f/u with pcp.

## 2017-07-07 ENCOUNTER — Other Ambulatory Visit: Payer: 59

## 2017-07-07 DIAGNOSIS — R7989 Other specified abnormal findings of blood chemistry: Secondary | ICD-10-CM

## 2017-07-08 LAB — THYROID PANEL WITH TSH
Free Thyroxine Index: 1.2 (ref 1.2–4.9)
T3 UPTAKE RATIO: 22 % — AB (ref 24–39)
T4 TOTAL: 5.5 ug/dL (ref 4.5–12.0)
TSH: 22.88 u[IU]/mL — AB (ref 0.450–4.500)

## 2017-07-13 ENCOUNTER — Encounter: Payer: Self-pay | Admitting: Family Medicine

## 2017-07-13 ENCOUNTER — Ambulatory Visit (INDEPENDENT_AMBULATORY_CARE_PROVIDER_SITE_OTHER): Payer: 59 | Admitting: Family Medicine

## 2017-07-13 VITALS — BP 120/72 | HR 97 | Temp 98.3°F | Resp 16 | Ht 63.0 in | Wt 261.4 lb

## 2017-07-13 DIAGNOSIS — R7989 Other specified abnormal findings of blood chemistry: Secondary | ICD-10-CM

## 2017-07-13 MED ORDER — LEVOTHYROXINE SODIUM 150 MCG PO TABS: 150.0000 ug | ORAL_TABLET | Freq: Every day | ORAL | 0 refills | Status: DC

## 2017-07-13 NOTE — Progress Notes (Signed)
Name: Dana Whitaker   MRN: [id number]    DOB: 1976-06-10   Date:07/13/2017       Progress Note  Subjective  Chief Complaint  Chief Complaint  Patient presents with  . Abnormal Labs    follow up TSH    HPI  Pt. Presents for follow up of abnormal thyroid panel obtained by Gynecologist last week, her TSH was elevated to 22.880, normal Free T4, below normal T3 uptake ratio. She denies any symptoms including fatigue, weight gain, constipation, dry skin etc.  She has family history of thyroid nodules and hypothyroidism.    Past Medical History:  Diagnosis Date  . ADHD (attention deficit hyperactivity disorder)   . Anxiety   . Bipolar disorder ([place])   . Breast pain   . Depression   . Eczema   . Falls   . Fatigue   . Fibromyalgia   . GERD (gastroesophageal reflux disease)   . Headache   . History of bronchitis   . History of sprain of both ankles   . Hyperlipemia   . Obesity   . Postcoital bleeding   . Pre-diabetes   . Sleep apnea    does not use CPAP   . Uterine cancer ([place])   . Vaginal dysplasia   . Varicose veins of lower extremity    bilateral    Past Surgical History:  Procedure Laterality Date  . ABDOMINAL HYSTERECTOMY     tah.bso  . CARPAL TUNNEL RELEASE     pt states did not have surgery just injections   . LAPAROSCOPIC GASTRIC SLEEVE RESECTION N/A 06/13/2016   Procedure: LAPAROSCOPIC GASTRIC SLEEVE RESECTION WITH HIATAL HERNIA REPAIR AND UPPER ENDOSCOPY;  Surgeon: Greer Pickerel, MD;  Location: [facility];  Service: General;  Laterality: N/A;  . LEEP    . TONSILLECTOMY    . VARICOSE VEIN SURGERY      Family History  Problem Relation Age of Onset  . Hyperlipidemia Mother   . Bipolar disorder Mother   . Heart attack Father   . Drug abuse Father   . Anxiety disorder Sister   . Depression Sister   . Anxiety disorder Sister   . Depression Sister   . ADD / ADHD Sister   . Alcohol abuse Sister   . Thyroid disease Maternal Grandmother   . Diabetes Maternal  Grandmother   . Stroke Maternal Grandmother   . Ovarian cancer Neg Hx   . Breast cancer Neg Hx   . Colon cancer Neg Hx     Social History   Socioeconomic History  . Marital status: Married    Spouse name: Not on file  . Number of children: Not on file  . Years of education: Not on file  . Highest education level: Not on file  Social Needs  . Financial resource strain: Not on file  . Food insecurity - worry: Not on file  . Food insecurity - inability: Not on file  . Transportation needs - medical: Not on file  . Transportation needs - non-medical: Not on file  Occupational History  . Not on file  Tobacco Use  . Smoking status: Former Smoker    Packs/day: 1.00    Years: 23.00    Pack years: 23.00    Types: Cigarettes    Start date: 02/05/1994    Last attempt to quit: 05/10/2016    Years since quitting: 1.1  . Smokeless tobacco: Never Used  Substance and Sexual Activity  . Alcohol use: No  Alcohol/week: 0.0 oz  . Drug use: No  . Sexual activity: Yes    Birth control/protection: Surgical  Other Topics Concern  . Not on file  Social History Narrative  . Not on file     Current Outpatient Medications:  .  Calcium Carbonate-Vit D-Min (CALTRATE 600+D PLUS MINERALS) 600-800 MG-UNIT CHEW, Chew by mouth., Disp: , Rfl:  .  Multiple Vitamin (MULTIVITAMIN) capsule, Take 1 capsule by mouth daily., Disp: , Rfl:  .  Azelastine-Fluticasone 137-50 MCG/ACT SUSP, Place 2 sprays into the nose daily. (Patient not taking: Reported on 07/13/2017), Disp: 23 g, Rfl: 1 .  benzonatate (TESSALON PERLES) 100 MG capsule, Take 1 capsule (100 mg total) by mouth 3 (three) times daily as needed for cough. (Patient not taking: Reported on 07/13/2017), Disp: 20 capsule, Rfl: 0 .  levocetirizine (XYZAL) 5 MG tablet, Take 1 tablet (5 mg total) by mouth every evening. (Patient not taking: Reported on 07/13/2017), Disp: 20 tablet, Rfl: 0  Allergies  Allergen Reactions  . Ciprofloxacin Hives  . Tramadol  Rash     ROS  Please see history of present illness for complete discussion of ROS  Objective  Vitals:   07/13/17 1618  BP: 120/72  Pulse: 97  Resp: 16  Temp: 98.3 F (36.8 C)  TempSrc: Oral  SpO2: 99%  Weight: 261 lb 6.4 oz (118.6 kg)  Height: 5\' 3"  (1.6 m)    Physical Exam  Constitutional: She is oriented to person, place, and time and well-developed, well-nourished, and in no distress.  HENT:  Head: Normocephalic and atraumatic.  Neck: Trachea normal. No thyroid mass and no thyromegaly present.    Palpable cystic structure on the right anterior neck, lymph node vs thyroid nodule.  Cardiovascular: Normal rate, regular rhythm and normal heart sounds.  No murmur heard. Pulmonary/Chest: Effort normal and breath sounds normal. She has no wheezes.  Neurological: She is alert and oriented to person, place, and time.  Psychiatric: Mood, memory, affect and judgment normal.  Nursing note and vitals reviewed.     Recent Results (from the past 2160 hour(s))  Thyroid Panel With TSH     Status: Abnormal   Collection Time: 07/07/17  8:30 AM  Result Value Ref Range   TSH 22.880 (H) 0.450 - 4.500 uIU/mL   T4, Total 5.5 4.5 - 12.0 ug/dL   T3 Uptake Ratio 22 (L) 24 - 39 %   Free Thyroxine Index 1.2 1.2 - 4.9     Assessment & Plan  1. Abnormal TSH Obtained ultrasound of thyroid to rule out any nodules, reviewed TSH was was abnormally elevated and will start on levothyroxine 150 g every morning, repeat thyroid panel in 6 weeks. - levothyroxine (SYNTHROID, LEVOTHROID) 150 MCG tablet; Take 1 tablet (150 mcg total) by mouth daily before breakfast.  Dispense: 90 tablet; Refill: 0 - US Soft Tissue Head/Neck; Future   Okema Rollinson Asad A. [facility] 07/13/2017 4:23 PM

## 2017-07-25 ENCOUNTER — Telehealth: Payer: Self-pay | Admitting: Family Medicine

## 2017-07-25 NOTE — Telephone Encounter (Signed)
I contacted this patient to see if I could clear some things up for her. I informed her that a MyChart message was sent to her regarding her [place] in [place] on 07/17/17, but she stated that she does not check her MyChart all the time and prefers phone calls.  I apologized for the inconvenience and told her that I will put it in her file to call her instead.

## 2017-07-25 NOTE — Telephone Encounter (Signed)
Copied from [place] [id number]. Topic: Referral - Question >> Jul 25, 2017  8:24 AM Robina Ade, Helene Kelp D wrote: Reason for CRM: Patient called to talk to the person that does the referrals because she is not sure why she has an appt  at [facility] for an [place] if she had it done in [place] last week. Please call patient back, thanks.

## 2017-07-26 ENCOUNTER — Ambulatory Visit: Payer: 59

## 2017-07-26 ENCOUNTER — Telehealth: Payer: Self-pay

## 2017-07-26 NOTE — Telephone Encounter (Signed)
Notified Dana Whitaker of intact MMR. MLH-1, MSH-2, MSH-6, PMS-2 with intact nuclear expression. Update pathology sent to medical records for scanning and copy mailed to Dana Whitaker. Oncology Nurse Navigator Documentation  Navigator Location: [facility] (07/26/17 1300)   )Navigator Encounter Type: Telephone;Diagnostic Results (07/26/17 1300) Telephone: Outgoing Call;Diagnostic Results (07/26/17 1300)                   Patient Visit Type: GynOnc (07/26/17 1300)                              Time Spent with Patient: 15 (07/26/17 1300)

## 2017-08-17 ENCOUNTER — Telehealth: Payer: Self-pay

## 2017-08-17 NOTE — Telephone Encounter (Signed)
Spoke to the patient and mention to her since she is a new pt to Dr.Sowles she would have to see her first in order to understand what Labs need to be order. Pt understood and she will she her at her schedule apt.

## 2017-08-17 NOTE — Telephone Encounter (Signed)
       Copied from [place] [id number]. Topic: Appointment Scheduling - Scheduling Inquiry for Clinic >> Aug 15, 2017  7:41 AM Synthia Innocent wrote: Reason for CRM: Patient is requesting appt for labs prior to appt on 08/22/17, TSH level. Please advise  >> Aug 17, 2017  8:18 AM Bennye Alm wrote: Patient called stating that she still hasn't heard anything regarding the orders for her TSH. Patient wants to have labs done prior to seeing Dr. Ancil Boozer on 08/22/17.

## 2017-08-22 ENCOUNTER — Ambulatory Visit (INDEPENDENT_AMBULATORY_CARE_PROVIDER_SITE_OTHER): Payer: 59 | Admitting: Family Medicine

## 2017-08-22 ENCOUNTER — Telehealth: Payer: Self-pay | Admitting: Family Medicine

## 2017-08-22 ENCOUNTER — Encounter: Payer: Self-pay | Admitting: Family Medicine

## 2017-08-22 VITALS — BP 104/80 | HR 65 | Resp 14 | Ht 63.0 in | Wt 270.4 lb

## 2017-08-22 DIAGNOSIS — Z79899 Other long term (current) drug therapy: Secondary | ICD-10-CM | POA: Diagnosis not present

## 2017-08-22 DIAGNOSIS — F5081 Binge eating disorder: Secondary | ICD-10-CM

## 2017-08-22 DIAGNOSIS — E039 Hypothyroidism, unspecified: Secondary | ICD-10-CM | POA: Diagnosis not present

## 2017-08-22 DIAGNOSIS — R7303 Prediabetes: Secondary | ICD-10-CM | POA: Diagnosis not present

## 2017-08-22 DIAGNOSIS — E781 Pure hyperglyceridemia: Secondary | ICD-10-CM | POA: Diagnosis not present

## 2017-08-22 DIAGNOSIS — F900 Attention-deficit hyperactivity disorder, predominantly inattentive type: Secondary | ICD-10-CM

## 2017-08-22 DIAGNOSIS — Z23 Encounter for immunization: Secondary | ICD-10-CM | POA: Diagnosis not present

## 2017-08-22 DIAGNOSIS — F331 Major depressive disorder, recurrent, moderate: Secondary | ICD-10-CM

## 2017-08-22 DIAGNOSIS — Z114 Encounter for screening for human immunodeficiency virus [HIV]: Secondary | ICD-10-CM

## 2017-08-22 MED ORDER — LISDEXAMFETAMINE DIMESYLATE 40 MG PO CAPS: 40.0000 mg | ORAL_CAPSULE | ORAL | 0 refills | Status: DC

## 2017-08-22 MED ORDER — LISDEXAMFETAMINE DIMESYLATE 30 MG PO CAPS: 30.0000 mg | ORAL_CAPSULE | Freq: Every day | ORAL | 0 refills | Status: DC

## 2017-08-22 MED ORDER — LISDEXAMFETAMINE DIMESYLATE 50 MG PO CAPS: 50.0000 mg | ORAL_CAPSULE | Freq: Every day | ORAL | 0 refills | Status: DC

## 2017-08-22 NOTE — Telephone Encounter (Signed)
Copied from [place] [id number]. Topic: Quick Communication - See Telephone Encounter >> Aug 22, 2017  9:28 AM Percell Belt A wrote: CRM for notification. See Telephone encounter for: Pt called in and said that she went to pharmacy to pick up  lisdexamfetamine (VYVANSE) 30 MG capsule [[id number]], per pharmacy this is going to need a PA .  Ins is not going to approve without a PA  08/22/17.

## 2017-08-22 NOTE — Progress Notes (Addendum)
Name: Dana Whitaker   MRN: [id number]    DOB: 1975/09/13   Date:08/22/2017       Progress Note  Subjective  Chief Complaint  Chief Complaint  Patient presents with  . Thyroid Problem    abnormal TSH.  . Obesity    HPI  Obesity: she has a long history of morbid obesity. Maximum of 298 lbs Dec 2017 prior to gastric sleeve surgery, she lost down to 238 lbs June 2018 but gradually gained back. She had labs done June 2018 and TSH was above 50 but she was not notified, and January at 23 , she was started on Synthroid 150 mcg . She is very frustrated. She has been walking 30 minutes daily with a co-worker, she has been eating healthier, but gets frustrated and at least 5 times a week she cheats on her diet ( biscuits/pasta).   Hypothyroidism: she states that since June 2018 she has gained weight, she has also noticed hair loss, fatigue and constipation. However she thought it was secondary to bariatric surgery change in job ( driving more than usual) and did not correlate with any other problems.   Major depression: she states on an off her entire life. She states between 2012-2016 she found out that her husband was having an affair, her father died in 64. She was working full time and going to college at the same time. She states she took medication at the time, but does not recall name. She was seeing a therapist. She is still with her husband, but she does not trust him.   ADHD: son also has ADHD, diagnosed by Dr. Leonides Schanz, she took Ritalin in the past. She has symptoms of binge eating disorder - over eats, feels guilty but unable to stop. Has obesity. We will try Vyvanse. She states she can eat a box of chocolate in one sitting. In the past she could eat a bag of sweet sixteen donuts in a row and 4 regular size donuts, followed by guilty feeling. She said has always been present in her life  Dyslipidemia: she is not on medication, we will recheck labs.   Patient Active Problem List   Diagnosis  Date Noted  . Urgency of urination 11/10/2016  . Surgical menopause 11/10/2016  . Cyst of left breast 08/30/2016  . Endometrial cancer ([place]) 06/29/2016  . 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 Fatty liver 06/13/2016  . Bilateral carpal tunnel syndrome 06/13/2016  . Status post bariatric surgery 06/13/2016  . Hypertriglyceridemia 05/19/2016  . History of fracture of left ankle 05/19/2016  . Former cigarette smoker 02/17/2016  . Prediabetes 02/17/2016  . Status post laparoscopic hysterectomy 11/10/2015  . Morbid obesity with BMI of 50.0-59.9, adult ([place]) 11/10/2015  . Borderline personality disorder ([place]) 01/13/2015  . ADHD (attention deficit hyperactivity disorder), combined type 01/13/2015  . Apnea, sleep 01/13/2015  . Insomnia, persistent 01/13/2015  . Depression, major, recurrent, moderate ([place]) 01/13/2015  . Fibromyalgia 01/13/2015  . Classical migraine with intractable migraine 09/12/2014  . 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 ABDOMINAL HYSTERECTOMY     tah.bso  . CARPAL TUNNEL RELEASE     pt states did not have surgery just injections   . LAPAROSCOPIC GASTRIC SLEEVE RESECTION N/A 06/13/2016   Procedure: LAPAROSCOPIC GASTRIC SLEEVE RESECTION WITH HIATAL HERNIA REPAIR AND UPPER ENDOSCOPY;  Surgeon: Greer Pickerel, MD;  Location: [facility];  Service: General;  Laterality: N/A;  . LEEP    .  TONSILLECTOMY    . VARICOSE VEIN SURGERY      Family History  Problem Relation Age of Onset  . Hyperlipidemia Mother   . Bipolar disorder Mother   . Heart attack Father   . Drug abuse Father   . Anxiety disorder Sister   . Depression Sister   . Anxiety disorder Sister   . Depression Sister   . ADD / ADHD Sister   . Alcohol abuse Sister   . Thyroid disease Maternal Grandmother   . Diabetes Maternal Grandmother   . Stroke Maternal Grandmother   . Ovarian cancer Neg Hx   . Breast cancer Neg Hx   . Colon cancer Neg Hx      Social History   Socioeconomic History  . Marital status: Married    Spouse name: Not on file  . Number of children: Not on file  . Years of education: Not on file  . Highest education level: Not on file  Social Needs  . Financial resource strain: Not on file  . Food insecurity - worry: Not on file  . Food insecurity - inability: Not on file  . Transportation needs - medical: Not on file  . Transportation needs - non-medical: Not on file  Occupational History  . Occupation: Optometrist  Tobacco Use  . Smoking status: Former Smoker    Packs/day: 1.00    Years: 23.00    Pack years: 23.00    Types: Cigarettes    Start date: 02/05/1994    Last attempt to quit: 05/10/2016    Years since quitting: 1.2  . Smokeless tobacco: Never Used  Substance and Sexual Activity  . Alcohol use: No    Alcohol/week: 0.0 oz  . Drug use: No  . Sexual activity: Yes    Birth control/protection: Surgical  Other Topics Concern  . Not on file  Social History Narrative   Married,    Has a grown son, raising a 41 yo step-daughter   Not in a good marital situation     Current Outpatient Medications:  .  Calcium Carbonate-Vit D-Min (CALTRATE 600+D PLUS MINERALS) 600-800 MG-UNIT CHEW, Chew by mouth., Disp: , Rfl:  .  levothyroxine (SYNTHROID, LEVOTHROID) 150 MCG tablet, Take 1 tablet (150 mcg total) by mouth daily before breakfast., Disp: 90 tablet, Rfl: 0 .  Multiple Vitamin (MULTIVITAMIN) capsule, Take 1 capsule by mouth daily., Disp: , Rfl:  .  lisdexamfetamine (VYVANSE) 30 MG capsule, Take 1 capsule (30 mg total) by mouth daily., Disp: 10 capsule, Rfl: 0 .  lisdexamfetamine (VYVANSE) 40 MG capsule, Take 1 capsule (40 mg total) by mouth every morning., Disp: 10 capsule, Rfl: 0 .  lisdexamfetamine (VYVANSE) 50 MG capsule, Take 1 capsule (50 mg total) by mouth daily., Disp: 10 capsule, Rfl: 0  Allergies  Allergen Reactions  . Ciprofloxacin Hives  . Tramadol Rash     ROS  Constitutional:  Negative for fever , positive for weight change.  Respiratory: Negative for cough and shortness of breath.   Cardiovascular: Negative for chest pain or palpitations.  Gastrointestinal: Negative for abdominal pain, no bowel changes.  Musculoskeletal: Negative for gait problem or joint swelling.  Skin: Negative for rash.  Neurological: Negative for dizziness , positive for intermittent  headache.  No other specific complaints in a complete review of systems (except as listed in HPI above).  Objective  Vitals:   08/22/17 0745  BP: 104/80  Pulse: 65  Resp: 14  SpO2: 98%  Weight: 270 lb 6.4 oz (  122.7 kg)  Height: 5\' 3"  (1.6 m)    Body mass index is 47.9 kg/m.  Physical Exam  Constitutional: Patient appears well-developed and well-nourished. Obese  No distress.  HEENT: head atraumatic, normocephalic, pupils equal and reactive to light, neck supple, throat within normal limits, normal thyroid  Cardiovascular: Normal rate, regular rhythm and normal heart sounds.  No murmur heard. No BLE edema. Pulmonary/Chest: Effort normal and breath sounds normal. No respiratory distress. Abdominal: Soft.  There is no tenderness. Psychiatric: Patient has a normal mood and affect. behavior is normal. Judgment and thought content normal.  Recent Results (from the past 2160 hour(s))  Thyroid Panel With TSH     Status: Abnormal   Collection Time: 07/07/17  8:30 AM  Result Value Ref Range   TSH 22.880 (H) 0.450 - 4.500 uIU/mL   T4, Total 5.5 4.5 - 12.0 ug/dL   T3 Uptake Ratio 22 (L) 24 - 39 %   Free Thyroxine Index 1.2 1.2 - 4.9     PHQ2/9: Depression screen [facility] 2/9 08/30/2016 08/22/2016 08/10/2016 05/31/2016 05/19/2016  Decreased Interest 0 0 0 0 0  Down, Depressed, Hopeless 0 0 0 0 0  PHQ - 2 Score 0 0 0 0 0     Fall Risk: Fall Risk  08/22/2017 08/30/2016 08/22/2016 08/10/2016 05/31/2016  Falls in the past year? No No No No No  Number falls in past yr: - - - - -  Injury with Fall? - - - - -   [place] for fall due to : - - - - -  Risk for fall due to: Comment - - - - -  Follow up - - - - -      Functional Status Survey: Is the patient deaf or have difficulty hearing?: No Does the patient have difficulty seeing, even when wearing glasses/contacts?: No Does the patient have difficulty concentrating, remembering, or making decisions?: No Does the patient have difficulty walking or climbing stairs?: No Does the patient have difficulty dressing or bathing?: No Does the patient have difficulty doing errands alone such as visiting a doctor's office or shopping?: No    Assessment & Plan  1. Major depressive disorder, recurrent episode, moderate ([place])  She refuses medication   2. Adult hypothyroidism  - TSH  3. Flu vaccine need  - Flu Vaccine QUAD 36+ mos IM  4. Need for Tdap vaccination  - Tdap vaccine greater than or equal to 7yo IM  5. Encounter for screening for HIV  - HIV antibody  6. Hypertriglyceridemia  - Lipid panel  7. Prediabetes  - Hemoglobin A1c  8. Long-term use of high-risk medication  - Comprehensive metabolic panel  9. Attention deficit hyperactivity disorder (ADHD), predominantly inattentive type  - lisdexamfetamine (VYVANSE) 30 MG capsule; Take 1 capsule (30 mg total) by mouth daily.  Dispense: 10 capsule; Refill: 0 - lisdexamfetamine (VYVANSE) 40 MG capsule; Take 1 capsule (40 mg total) by mouth every morning.  Dispense: 10 capsule; Refill: 0 - lisdexamfetamine (VYVANSE) 50 MG capsule; Take 1 capsule (50 mg total) by mouth daily.  Dispense: 10 capsule; Refill: 0

## 2017-08-22 NOTE — Telephone Encounter (Signed)
PA has been approved and faxed to the requested pharmacy ([facility])

## 2017-08-23 ENCOUNTER — Telehealth: Payer: Self-pay | Admitting: Family Medicine

## 2017-08-23 LAB — HEMOGLOBIN A1C
EAG (MMOL/L): 6.3 (calc)
Hgb A1c MFr Bld: 5.6 % of total Hgb (ref <5.7)
MEAN PLASMA GLUCOSE: 114 (calc)

## 2017-08-23 LAB — LIPID PANEL
CHOL/HDL RATIO: 2.5 (calc) (ref <5.0)
Cholesterol: 187 mg/dL (ref <200)
HDL: 74 mg/dL (ref >50)
LDL CHOLESTEROL (CALC): 96 mg/dL
NON-HDL CHOLESTEROL (CALC): 113 mg/dL (ref <130)
TRIGLYCERIDES: 76 mg/dL (ref <150)

## 2017-08-23 LAB — COMPREHENSIVE METABOLIC PANEL
AG Ratio: 1.7 (calc) (ref 1.0–2.5)
ALBUMIN MSPROF: 4.2 g/dL (ref 3.6–5.1)
ALKALINE PHOSPHATASE (APISO): 60 U/L (ref 33–115)
ALT: 13 U/L (ref 6–29)
AST: 14 U/L (ref 10–30)
BUN: 16 mg/dL (ref 7–25)
CALCIUM: 9.5 mg/dL (ref 8.6–10.2)
CO2: 28 mmol/L (ref 20–32)
CREATININE: 0.67 mg/dL (ref 0.50–1.10)
Chloride: 106 mmol/L (ref 98–110)
Globulin: 2.5 g/dL (calc) (ref 1.9–3.7)
Glucose, Bld: 95 mg/dL (ref 65–139)
Potassium: 4.5 mmol/L (ref 3.5–5.3)
Sodium: 139 mmol/L (ref 135–146)
Total Bilirubin: 0.5 mg/dL (ref 0.2–1.2)
Total Protein: 6.7 g/dL (ref 6.1–8.1)

## 2017-08-23 LAB — HIV ANTIBODY (ROUTINE TESTING W REFLEX): HIV 1&2 Ab, 4th Generation: NONREACTIVE

## 2017-08-23 LAB — TSH: TSH: 0.73 mIU/L

## 2017-08-23 NOTE — Telephone Encounter (Signed)
Copied from [place] [id number]. Topic: Quick Communication - See Telephone Encounter >> Aug 23, 2017 11:37 AM Conception Chancy, NT wrote: CRM for notification. See Telephone encounter for:  08/23/17.  [name] is calling from [facility] and states the vouchers for Vyvanse 30,40,50mg  will not work without pre approval. She is requesting a call back.   [place], [place]

## 2017-08-23 NOTE — Telephone Encounter (Signed)
I called [facility] and informed them that a PA was done for the 40mg  as well as the 50mg  Vyvanse on 08/22/17 but it was denied. The only one that was approved was the 30mg .  When I relayed this information to Dr. Ancil Boozer, she stated that this patient was given vouchers for all 3 and should be able to use them w/o needing a PA.  I then told the tech and he said that he would relay this information to the pharmacist.

## 2017-09-19 ENCOUNTER — Ambulatory Visit: Payer: Self-pay | Admitting: Family Medicine

## 2017-09-20 ENCOUNTER — Ambulatory Visit (INDEPENDENT_AMBULATORY_CARE_PROVIDER_SITE_OTHER): Payer: 59 | Admitting: Family Medicine

## 2017-09-20 ENCOUNTER — Encounter: Payer: Self-pay | Admitting: Family Medicine

## 2017-09-20 VITALS — BP 140/90 | HR 85 | Ht 63.0 in | Wt 268.0 lb

## 2017-09-20 DIAGNOSIS — R030 Elevated blood-pressure reading, without diagnosis of hypertension: Secondary | ICD-10-CM | POA: Diagnosis not present

## 2017-09-20 DIAGNOSIS — Z6841 Body Mass Index (BMI) 40.0 and over, adult: Secondary | ICD-10-CM

## 2017-09-20 DIAGNOSIS — E039 Hypothyroidism, unspecified: Secondary | ICD-10-CM | POA: Diagnosis not present

## 2017-09-20 DIAGNOSIS — F331 Major depressive disorder, recurrent, moderate: Secondary | ICD-10-CM

## 2017-09-20 DIAGNOSIS — F900 Attention-deficit hyperactivity disorder, predominantly inattentive type: Secondary | ICD-10-CM

## 2017-09-20 DIAGNOSIS — F5081 Binge eating disorder: Secondary | ICD-10-CM | POA: Diagnosis not present

## 2017-09-20 MED ORDER — LISDEXAMFETAMINE DIMESYLATE 50 MG PO CAPS: 50.0000 mg | ORAL_CAPSULE | Freq: Every day | ORAL | 0 refills | Status: DC

## 2017-09-20 MED ORDER — LEVOTHYROXINE SODIUM 150 MCG PO TABS: 150.0000 ug | ORAL_TABLET | Freq: Every day | ORAL | 0 refills | Status: DC

## 2017-09-20 NOTE — Progress Notes (Signed)
Name: Dana Whitaker   MRN: [id number]    DOB: 11-12-1975   Date:09/20/2017       Progress Note  Subjective  Chief Complaint  Chief Complaint  Patient presents with  . Depression    HPI  Obesity: she has a long history of morbid obesity. Maximum of 298 lbs Dec 2017 prior to gastric sleeve surgery, she lost down to 238 lbs June 2018 but gradually gained back., we added Vyvanse 08/2017 for ADD and binge eating disorder, she started on lower dose, she is doing better, not snacking as often, and has lost 4 lbs since started on medication   Hypothyroidism: she states that since June 2018, she is on correct dose of synthroid and TSH is at goal   Major depression: she states on an off her entire life. She states between 2012-2016 she found out that her husband was having an affair, her father died in 67. She was working full time and going to college at the same time. She states she took medication at the time, but does not recall name. She was seeing a therapist. She is still with her husband, but she does not trust him. She started on Vyvanse 08/2017 and she still feels fatigued, but is feeling better. She has noticed that sleep has been affected over the past few days, difficulty falling asleep. Discussed adding clonidine but she refuses. Discussed adding melatonin.   ADHD: son also has ADHD, diagnosed by Dr. Leonides Schanz, she took Ritalin in the past. She has symptoms of binge eating disorder - over eats, feels guilty but unable to stop. Has obesity.  She states she can eat a box of chocolate in one sitting. In the past she could eat a bag of sweet sixteen donuts in a row and 4 regular size donuts, followed by guilty feeling. She said has always been present in her life, we started Vyvanse 08/2017 and it has helped with appetite but also focus. Not feeling like a zombie, she would like to go up on the dose, but having difficulty sleeping and also bp is elevated  Dyslipidemia: she is not on medication,  lasts labs at goal,   Elevated bp: she states she is stressed at work, explained also that Vyvanse can cause elevation of bp. She wants to monitor for now.    Patient Active Problem List   Diagnosis Date Noted  . Urgency of urination 11/10/2016  . Surgical menopause 11/10/2016  . Cyst of left breast 08/30/2016  . Endometrial cancer ([place]) 06/29/2016  . 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 Fatty liver 06/13/2016  . Bilateral carpal tunnel syndrome 06/13/2016  . Status post bariatric surgery 06/13/2016  . Hypertriglyceridemia 05/19/2016  . History of fracture of left ankle 05/19/2016  . Former cigarette smoker 02/17/2016  . Prediabetes 02/17/2016  . Status post laparoscopic hysterectomy 11/10/2015  . Morbid obesity with BMI of 50.0-59.9, adult ([place]) 11/10/2015  . Borderline personality disorder ([place]) 01/13/2015  . ADHD (attention deficit hyperactivity disorder), combined type 01/13/2015  . Apnea, sleep 01/13/2015  . Insomnia, persistent 01/13/2015  . Depression, major, recurrent, moderate ([place]) 01/13/2015  . Fibromyalgia 01/13/2015  . Classical migraine with intractable migraine 09/12/2014  . 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 ABDOMINAL HYSTERECTOMY     tah.bso  . CARPAL TUNNEL RELEASE     pt states did not have surgery just injections   . LAPAROSCOPIC GASTRIC SLEEVE RESECTION N/A 06/13/2016   Procedure: LAPAROSCOPIC  GASTRIC SLEEVE RESECTION WITH HIATAL HERNIA REPAIR AND UPPER ENDOSCOPY;  Surgeon: Greer Pickerel, MD;  Location: [facility];  Service: General;  Laterality: N/A;  . LEEP    . TONSILLECTOMY    . VARICOSE VEIN SURGERY      Family History  Problem Relation Age of Onset  . Hyperlipidemia Mother   . Bipolar disorder Mother   . Heart attack Father   . Drug abuse Father   . Anxiety disorder Sister   . Depression Sister   . Anxiety disorder Sister   . Depression Sister   . ADD / ADHD Sister   . Alcohol abuse  Sister   . Thyroid disease Maternal Grandmother   . Diabetes Maternal Grandmother   . Stroke Maternal Grandmother   . Ovarian cancer Neg Hx   . Breast cancer Neg Hx   . Colon cancer Neg Hx     Social History   Socioeconomic History  . Marital status: Married    Spouse name: Not on file  . Number of children: Not on file  . Years of education: Not on file  . Highest education level: Not on file  Social Needs  . Financial resource strain: Not on file  . Food insecurity - worry: Not on file  . Food insecurity - inability: Not on file  . Transportation needs - medical: Not on file  . Transportation needs - non-medical: Not on file  Occupational History  . Occupation: Optometrist  Tobacco Use  . Smoking status: Former Smoker    Packs/day: 1.00    Years: 23.00    Pack years: 23.00    Types: Cigarettes    Start date: 02/05/1994    Last attempt to quit: 05/10/2016    Years since quitting: 1.3  . Smokeless tobacco: Never Used  Substance and Sexual Activity  . Alcohol use: No    Alcohol/week: 0.0 oz  . Drug use: No  . Sexual activity: Yes    Birth control/protection: Surgical  Other Topics Concern  . Not on file  Social History Narrative   Married,    Has a grown son, raising a 7 yo step-daughter   Not in a good marital situation     Current Outpatient Medications:  .  Calcium Carbonate-Vit D-Min (CALTRATE 600+D PLUS MINERALS) 600-800 MG-UNIT CHEW, Chew by mouth., Disp: , Rfl:  .  levothyroxine (SYNTHROID, LEVOTHROID) 150 MCG tablet, Take 1 tablet (150 mcg total) by mouth daily before breakfast., Disp: 90 tablet, Rfl: 0 .  lisdexamfetamine (VYVANSE) 30 MG capsule, Take 1 capsule (30 mg total) by mouth daily., Disp: 10 capsule, Rfl: 0 .  lisdexamfetamine (VYVANSE) 40 MG capsule, Take 1 capsule (40 mg total) by mouth every morning., Disp: 10 capsule, Rfl: 0 .  lisdexamfetamine (VYVANSE) 50 MG capsule, Take 1 capsule (50 mg total) by mouth daily., Disp: 10 capsule, Rfl: 0 .   Multiple Vitamin (MULTIVITAMIN) capsule, Take 1 capsule by mouth daily., Disp: , Rfl:   Allergies  Allergen Reactions  . Ciprofloxacin Hives  . Tramadol Rash     ROS  Constitutional: Negative for fever or weight change.  Respiratory: Negative for cough and shortness of breath.   Cardiovascular: Negative for chest pain or palpitations.  Gastrointestinal: Negative for abdominal pain, no bowel changes.  Musculoskeletal: Negative for gait problem or joint swelling.  Skin: Negative for rash.  Neurological: Negative for dizziness or headache.  No other specific complaints in a complete review of systems (except as listed in HPI above).  Objective  Vitals:   09/20/17 1013 09/20/17 1014  BP: 130/90 140/90  Pulse: 85   SpO2: 98%   Weight: 268 lb (121.6 kg)   Height: 5\' 3"  (1.6 m)     Body mass index is 47.47 kg/m.  Physical Exam  Constitutional: Patient appears well-developed and well-nourished. Obese  No distress.  HEENT: head atraumatic, normocephalic, pupils equal and reactive to light, neck supple, throat within normal limits Cardiovascular: Normal rate, regular rhythm and normal heart sounds.  No murmur heard. No BLE edema. Pulmonary/Chest: Effort normal and breath sounds normal. No respiratory distress. Abdominal: Soft.  There is no tenderness. Psychiatric: Patient has a normal mood and affect. behavior is normal. Judgment and thought content normal.  Recent Results (from the past 2160 hour(s))  Thyroid Panel With TSH     Status: Abnormal   Collection Time: 07/07/17  8:30 AM  Result Value Ref Range   TSH 22.880 (H) 0.450 - 4.500 uIU/mL   T4, Total 5.5 4.5 - 12.0 ug/dL   T3 Uptake Ratio 22 (L) 24 - 39 %   Free Thyroxine Index 1.2 1.2 - 4.9  HIV antibody     Status: None   Collection Time: 08/22/17  8:44 AM  Result Value Ref Range   HIV 1&2 Ab, 4th Generation NON-REACTIVE NON-REACTI    Comment: HIV-1 antigen and HIV-1/HIV-2 antibodies were not detected. There is no  laboratory evidence of HIV infection. Marland Kitchen PLEASE NOTE: This information has been disclosed to you from records whose confidentiality may be protected by state law.  If your state requires such protection, then the state law prohibits you from making any further disclosure of the information without the specific written consent of the person to whom it pertains, or as otherwise permitted by law. A general authorization for the release of medical or other information is NOT sufficient for this purpose. . For additional information please refer to http://education.quest[facility].com/faq/FAQ106 (This link is being provided for informational/ educational purposes only.) . Marland Kitchen The performance of this assay has not been clinically validated in patients less than 63 years old. .   TSH     Status: None   Collection Time: 08/22/17  8:44 AM  Result Value Ref Range   TSH 0.73 mIU/L    Comment:           Reference Range .           > or = 20 Years  0.40-4.50 .                Pregnancy Ranges           First trimester    0.26-2.66           Second trimester   0.55-2.73           Third trimester    0.43-2.91   Hemoglobin A1c     Status: None   Collection Time: 08/22/17  8:44 AM  Result Value Ref Range   Hgb A1c MFr Bld 5.6 <5.7 % of total Hgb    Comment: For the purpose of screening for the presence of diabetes: . <5.7%       Consistent with the absence of diabetes 5.7-6.4%    Consistent with increased risk for diabetes             (prediabetes) > or =6.5%  Consistent with diabetes . This assay result is consistent with a decreased risk of diabetes. . Currently, no consensus exists regarding use of hemoglobin  A1c for diagnosis of diabetes in children. . According to American Diabetes Association (ADA) guidelines, hemoglobin A1c <7.0% represents optimal control in non-pregnant diabetic patients. Different metrics may apply to specific patient populations.  Standards of Medical  Care in Diabetes(ADA). .    Mean Plasma Glucose 114 (calc)   eAG (mmol/L) 6.3 (calc)  Lipid panel     Status: None   Collection Time: 08/22/17  8:44 AM  Result Value Ref Range   Cholesterol 187 <200 mg/dL   HDL 74 >50 mg/dL   Triglycerides 76 <150 mg/dL   LDL Cholesterol (Calc) 96 mg/dL (calc)    Comment: Reference range: <100 . Desirable range <100 mg/dL for primary prevention;   <70 mg/dL for patients with CHD or diabetic patients  with > or = 2 CHD risk factors. Marland Kitchen LDL-C is now calculated using the Martin-Hopkins  calculation, which is a validated novel method providing  better accuracy than the Friedewald equation in the  estimation of LDL-C.  Cresenciano Genre et al. Annamaria Helling. 8546;270(35): 2061-2068  (http://education.[facility].com/faq/FAQ164)    Total CHOL/HDL Ratio 2.5 <5.0 (calc)   Non-HDL Cholesterol (Calc) 113 <130 mg/dL (calc)    Comment: For patients with diabetes plus 1 major ASCVD risk  factor, treating to a non-HDL-C goal of <100 mg/dL  (LDL-C of <70 mg/dL) is considered a therapeutic  option.   Comprehensive metabolic panel     Status: None   Collection Time: 08/22/17  8:44 AM  Result Value Ref Range   Glucose, Bld 95 65 - 139 mg/dL    Comment: .        Non-fasting reference interval .    BUN 16 7 - 25 mg/dL   Creat 0.67 0.50 - 1.10 mg/dL   BUN/Creatinine Ratio NOT APPLICABLE 6 - 22 (calc)   Sodium 139 135 - 146 mmol/L   Potassium 4.5 3.5 - 5.3 mmol/L   Chloride 106 98 - 110 mmol/L   CO2 28 20 - 32 mmol/L   Calcium 9.5 8.6 - 10.2 mg/dL   Total Protein 6.7 6.1 - 8.1 g/dL   Albumin 4.2 3.6 - 5.1 g/dL   Globulin 2.5 1.9 - 3.7 g/dL (calc)   AG Ratio 1.7 1.0 - 2.5 (calc)   Total Bilirubin 0.5 0.2 - 1.2 mg/dL   Alkaline phosphatase (APISO) 60 33 - 115 U/L   AST 14 10 - 30 U/L   ALT 13 6 - 29 U/L      PHQ2/9: Depression screen [facility] 2/9 08/30/2016 08/22/2016 08/10/2016 05/31/2016 05/19/2016  Decreased Interest 0 0 0 0 0  Down, Depressed, Hopeless 0 0 0 0 0   PHQ - 2 Score 0 0 0 0 0     Fall Risk: Fall Risk  09/20/2017 08/22/2017 08/30/2016 08/22/2016 08/10/2016  Falls in the past year? No No No No No  Number falls in past yr: - - - - -  Injury with Fall? - - - - -  [place] for fall due to : - - - - -  Risk for fall due to: Comment - - - - -  Follow up - - - - -     Functional Status Survey: Is the patient deaf or have difficulty hearing?: No Does the patient have difficulty seeing, even when wearing glasses/contacts?: No Does the patient have difficulty concentrating, remembering, or making decisions?: No Does the patient have difficulty walking or climbing stairs?: No Does the patient have difficulty dressing or bathing?:  No Does the patient have difficulty doing errands alone such as visiting a doctor's office or shopping?: No    Assessment & Plan  1. Major depressive disorder, recurrent episode, moderate ([facility])  - lisdexamfetamine (VYVANSE) 50 MG capsule; Take 1 capsule (50 mg total) by mouth daily.  Dispense: 90 capsule; Refill: 0  2. Binge eating disorder  - lisdexamfetamine (VYVANSE) 50 MG capsule; Take 1 capsule (50 mg total) by mouth daily.  Dispense: 90 capsule; Refill: 0  3. Elevated BP without diagnosis of hypertension  We monitor it for now  4. Morbid obesity with BMI of 50.0-59.9, adult [facility])  Discussed with the patient the risk posed by an increased BMI. Discussed importance of portion control, calorie counting and at least 150 minutes of physical activity weekly. Avoid sweet beverages and drink more water. Eat at least 6 servings of fruit and vegetables daily   5. Attention deficit hyperactivity disorder (ADHD), predominantly inattentive type  - lisdexamfetamine (VYVANSE) 50 MG capsule; Take 1 capsule (50 mg total) by mouth daily.  Dispense: 90 capsule; Refill: 0  6. Hypothyroidism (acquired)  - levothyroxine (SYNTHROID, LEVOTHROID) 150 MCG tablet; Take 1 tablet (150 mcg total) by mouth daily  before breakfast.  Dispense: 90 tablet; Refill: 0

## 2017-10-03 ENCOUNTER — Other Ambulatory Visit: Payer: Self-pay | Admitting: Family Medicine

## 2017-10-03 ENCOUNTER — Ambulatory Visit: Payer: Self-pay | Admitting: Family Medicine

## 2017-10-03 DIAGNOSIS — E039 Hypothyroidism, unspecified: Secondary | ICD-10-CM

## 2017-10-03 NOTE — Telephone Encounter (Signed)
Refill request for thyroid medication:  Last Physical:   Lab Results  Component Value Date   TSH 0.73 08/22/2017    No follow-ups on file.

## 2017-10-23 ENCOUNTER — Other Ambulatory Visit: Payer: Self-pay | Admitting: Family Medicine

## 2017-10-23 DIAGNOSIS — F900 Attention-deficit hyperactivity disorder, predominantly inattentive type: Secondary | ICD-10-CM

## 2017-10-23 DIAGNOSIS — F331 Major depressive disorder, recurrent, moderate: Secondary | ICD-10-CM

## 2017-10-23 DIAGNOSIS — F5081 Binge eating disorder: Secondary | ICD-10-CM

## 2017-10-23 NOTE — Telephone Encounter (Signed)
Copied from [place] [phone] [phone]. Topic: Quick Communication - See Telephone Encounter >> Oct 23, 2017 11:20 AM Bea Graff, NT wrote: CRM for notification. See Telephone encounter for: 10/23/17. Christine with [facility] is needing to speak with a nurse regarding this pts lisdexamfetamine (VYVANSE) 50 MG. She states the current rx they are unable to fill. CB#: [phone]

## 2017-10-24 ENCOUNTER — Telehealth: Payer: Self-pay | Admitting: Family Medicine

## 2017-10-24 ENCOUNTER — Other Ambulatory Visit: Payer: Self-pay | Admitting: Family Medicine

## 2017-10-24 DIAGNOSIS — F5081 Binge eating disorder: Secondary | ICD-10-CM

## 2017-10-24 DIAGNOSIS — F331 Major depressive disorder, recurrent, moderate: Secondary | ICD-10-CM

## 2017-10-24 DIAGNOSIS — F900 Attention-deficit hyperactivity disorder, predominantly inattentive type: Secondary | ICD-10-CM

## 2017-10-24 MED ORDER — LISDEXAMFETAMINE DIMESYLATE 50 MG PO CAPS: 50.0000 mg | ORAL_CAPSULE | Freq: Every day | ORAL | 0 refills | Status: DC

## 2017-10-24 NOTE — Telephone Encounter (Unsigned)
Copied from [place] [phone]. Topic: Quick Communication - Rx Refill/Question >> Oct 24, 2017  9:05 AM Dana Whitaker wrote: Medication  lisdexamfetamine (VYVANSE) 50 MG capsule      pt said pharmacy only gave her 30 pills in March and now she need a new RX  cause the pharmacy told her that she went past the 30 day mark to req her RX    Has the patient contacted their pharmacy yes   Preferred Pharmacy  [facility]     Agent: Please be advised that RX refills may take up to 3 business days. We ask that you follow-up with your pharmacy.

## 2017-10-24 NOTE — Telephone Encounter (Signed)
Vivien Rota  from [facility] called and is asking for nurse to return call concerning pt's vyvanse   [phone]

## 2017-10-24 NOTE — Telephone Encounter (Signed)
Pharmacy calling back checking on status.

## 2017-10-24 NOTE — Addendum Note (Signed)
Addended by: Inda Coke on: 10/24/2017 09:40 AM   Modules accepted: Orders

## 2017-10-24 NOTE — Telephone Encounter (Signed)
Copied from [place] [id number]. Topic: Inquiry >> Oct 24, 2017  1:00 PM Pricilla Handler wrote: Reason for CRM: Patient called stating that the coupon that she had been given for a discount on her Vyvance prescription is not being accepted by the pharmacy. Patient has requested a call back from Dr. Ancil Boozer assistant. Patient would like a call back today at [phone]. Patient states she received the coupon from Dr. Ancil Boozer on 09/20/2017. When she went to fill the prescription on this past Saturday 10/21/2017, she was told that the coupon was no longer active because it was over 30 days. The pharmacy stated to the patient that she was one day late. Please call patient today.       Thank You!!!

## 2017-10-24 NOTE — Addendum Note (Signed)
Addended by: Steele Sizer F on: 10/24/2017 01:24 PM   Modules accepted: Orders

## 2017-11-10 ENCOUNTER — Other Ambulatory Visit: Payer: Self-pay

## 2017-11-14 ENCOUNTER — Encounter: Payer: Self-pay | Admitting: Obstetrics and Gynecology

## 2017-11-24 NOTE — Progress Notes (Signed)
Patient ID: Dana Whitaker, female   DOB: May 13, 1976, 42 y.o.   MRN: [id number] [facility]  ENCOUNTER NOTE  Subjective:       Dana Whitaker is a 42 y.o. G40P1001 female here for a routine annual gynecologic exam.  Current complaints:  1. 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 She is no longer having hot flashes or night sweats.  Status post gastric sleeve surgery in December 2017.   Gynecologic History Patient's last menstrual period was 08/04/2013. Contraception: status post hysterectomy Last Pap: 11/06/2014 NEG/NEG. 06/2016 neg Results were: normal Last mammogram: 12/14/2016 birad 1 Results were: N/A  07/2013 EMB reveals a well differentiated endometrioid adenocarcinoma 09/10/2013 TLHBSO for grade 1 endometrioid endometrial cancer. Tumor size 0.4 cm, no myometrial invasion. LVSI not identified.   Obstetric History OB History  Gravida Para Term Preterm AB Living  1 1 1     1   SAB TAB Ectopic Multiple Live Births          1    # Outcome Date GA Lbr Len/2nd Weight Sex Delivery Anes PTL Lv  1 Term 1998   7 lb 8 oz (3.402 kg) M Vag-Spont   LIV    Past Medical History:  Diagnosis Date  . ADHD (attention deficit hyperactivity disorder)   . Anxiety   . Bipolar disorder ([place])   . Breast pain   . Depression   . Eczema   . Falls   . Fatigue   . Fibromyalgia   . GERD (gastroesophageal reflux disease)   . Headache   . History of bronchitis   . History of sprain of both ankles   . Hyperlipemia   . Obesity   . Postcoital bleeding   . Pre-diabetes   . Sleep apnea    does not use CPAP   . Uterine cancer ([place])   . Vaginal dysplasia   . Varicose veins of lower extremity    bilateral    Past Surgical History:  Procedure Laterality Date  . ABDOMINAL HYSTERECTOMY     tah.bso  . CARPAL TUNNEL RELEASE     pt states did not have surgery just injections   . LAPAROSCOPIC GASTRIC SLEEVE RESECTION N/A  06/13/2016   Procedure: LAPAROSCOPIC GASTRIC SLEEVE RESECTION WITH HIATAL HERNIA REPAIR AND UPPER ENDOSCOPY;  Surgeon: Greer Pickerel, MD;  Location: [facility];  Service: General;  Laterality: N/A;  . LEEP    . TONSILLECTOMY    . VARICOSE VEIN SURGERY      Current Outpatient Medications on File Prior to Visit  Medication Sig Dispense Refill  . Calcium Carbonate-Vit D-Min (CALTRATE 600+D PLUS MINERALS) 600-800 MG-UNIT CHEW Chew by mouth.    . levothyroxine (SYNTHROID, LEVOTHROID) 150 MCG tablet Take 1 tablet (150 mcg total) by mouth daily before breakfast. 90 tablet 0  . lisdexamfetamine (VYVANSE) 50 MG capsule Take 1 capsule (50 mg total) by mouth daily. 30 capsule 0  . Multiple Vitamin (MULTIVITAMIN) capsule Take 1 capsule by mouth daily.     No current facility-administered medications on file prior to visit.     Allergies  Allergen Reactions  . Ciprofloxacin Hives  . Tramadol Rash    Social History   Socioeconomic History  . Marital status: Married    Spouse name: Not on file  . Number of children: Not on file  . Years of education: Not on file  . Highest education level: Not on file  Occupational History  . Occupation:  accountant  Social Needs  . Financial resource strain: Not on file  . Food insecurity:    Worry: Not on file    Inability: Not on file  . Transportation needs:    Medical: Not on file    Non-medical: Not on file  Tobacco Use  . Smoking status: Former Smoker    Packs/day: 1.00    Years: 23.00    Pack years: 23.00    Types: Cigarettes    Start date: 02/05/1994    Last attempt to quit: 05/10/2016    Years since quitting: 1.5  . Smokeless tobacco: Never Used  Substance and Sexual Activity  . Alcohol use: No    Alcohol/week: 0.0 oz  . Drug use: No  . Sexual activity: Yes    Birth control/protection: Surgical  Lifestyle  . Physical activity:    Days per week: Not on file    Minutes per session: Not on file  . Stress: Not on file  Relationships  .  Social connections:    Talks on phone: Not on file    Gets together: Not on file    Attends religious service: Not on file    Active member of club or organization: Not on file    Attends meetings of clubs or organizations: Not on file    Relationship status: Not on file  . Intimate partner violence:    Fear of current or ex partner: Not on file    Emotionally abused: Not on file    Physically abused: Not on file    Forced sexual activity: Not on file  Other Topics Concern  . Not on file  Social History Narrative   Married,    Has a grown son, raising a 83 yo step-daughter   Not in a good marital situation    Family History  Problem Relation Age of Onset  . Hyperlipidemia Mother   . Bipolar disorder Mother   . Heart attack Father   . Drug abuse Father   . Anxiety disorder Sister   . Depression Sister   . Anxiety disorder Sister   . Depression Sister   . ADD / ADHD Sister   . Alcohol abuse Sister   . Thyroid disease Maternal Grandmother   . Diabetes Maternal Grandmother   . Stroke Maternal Grandmother   . Ovarian cancer Neg Hx   . Breast cancer Neg Hx   . Colon cancer Neg Hx     The following portions of the patient's history were reviewed and updated as appropriate: allergies, current medications, past family history, past medical history, past social history, past surgical history and problem list.  Review of Systems    Objective:   LMP 08/04/2013  LMP 08/04/2013  BP 105/67   Pulse 80   Ht 5\' 3"  (1.6 m)   Wt 265 lb 14.4 oz (120.6 kg)   LMP 08/04/2013   BMI 47.10 kg/m  CONSTITUTIONAL: Well-developed, morbidly obese female in no acute distress.  PSYCHIATRIC: Normal mood and affect. Normal behavior. Normal judgment and thought content. [place]: Alert. Normal muscle tone coordination. No cranial nerve deficit noted. HENT:  Normocephalic, atraumatic, External right and left ear normal.  EYES: Conjunctivae and EOM are normal.  No scleral icterus.  NECK: Normal  range of motion, supple, no masses.  Normal thyroid.  SKIN: Skin is warm and dry. No rash noted. Not diaphoretic. No erythema. No pallor. CARDIOVASCULAR: Normal heart rate noted, regular rhythm, no murmur. RESPIRATORY: Clear to auscultation bilaterally. Effort and  breath sounds normal, no problems with respiration noted. BREASTS: Symmetric in size. No masses, skin changes, nipple drainage, or lymphadenopathy.  ABDOMEN: No abdominal masses.  BLADDER: Normal PELVIC:  External Genitalia: Normal  BUS: Normal  Vagina: vault intact. Good support; fair estrogen effect  Cervix: Surgically Absent  Uterus: Surgically Absent  Adnexa: Surgically Absent  RV: Normal external exam; normal sphincter tone; no rectal masses MUSCULOSKELETAL: Normal range of motion.  No cyanosis or edema.  2+ distal pulses. LYMPHATIC: No Axillary, Supraclavicular Adenopathy.    Assessment:   Annual gynecologic examination 42 y.o. Contraception: status post hysterectomy  Obesity 2- BMI 47 Endometrioid adenocarcinoma, status post TLHBSO, no evidence of disease Surgically Menopausal-asymptomatic Status post gastric sleeve surgery, waxing and waning weight History of hypothyroidism, currently euthyroid on supplementation  Plan:  Pap: pap whpv Mammogram: ordered Stool Guaiac Testing:  Not Indicated Labs:  Thru pcp Routine preventative health maintenance measures emphasized: Exercise/Diet/Weight control, Tobacco Warnings, Alcohol/Substance use risks and Stress Management  Continue with calcium and vitamin D supplementation Recommend weight loss goal of 1 pound per month Return to [place], CMA  Brayton Mars, MD   Note: This dictation was prepared with Dragon dictation along with smaller phrase technology. Any transcriptional errors that result from this process are unintentional.

## 2017-11-28 ENCOUNTER — Encounter: Payer: Self-pay | Admitting: Obstetrics and Gynecology

## 2017-11-28 ENCOUNTER — Other Ambulatory Visit: Payer: Self-pay | Admitting: Family Medicine

## 2017-11-28 ENCOUNTER — Ambulatory Visit (INDEPENDENT_AMBULATORY_CARE_PROVIDER_SITE_OTHER): Payer: 59 | Admitting: Obstetrics and Gynecology

## 2017-11-28 VITALS — BP 105/67 | HR 80 | Ht 63.0 in | Wt 265.9 lb

## 2017-11-28 DIAGNOSIS — E6609 Other obesity due to excess calories: Secondary | ICD-10-CM

## 2017-11-28 DIAGNOSIS — F5081 Binge eating disorder: Secondary | ICD-10-CM

## 2017-11-28 DIAGNOSIS — E039 Hypothyroidism, unspecified: Secondary | ICD-10-CM | POA: Insufficient documentation

## 2017-11-28 DIAGNOSIS — Z9884 Bariatric surgery status: Secondary | ICD-10-CM | POA: Diagnosis not present

## 2017-11-28 DIAGNOSIS — F900 Attention-deficit hyperactivity disorder, predominantly inattentive type: Secondary | ICD-10-CM

## 2017-11-28 DIAGNOSIS — Z90710 Acquired absence of both cervix and uterus: Secondary | ICD-10-CM | POA: Diagnosis not present

## 2017-11-28 DIAGNOSIS — E8940 Asymptomatic postprocedural ovarian failure: Secondary | ICD-10-CM | POA: Diagnosis not present

## 2017-11-28 DIAGNOSIS — Z01419 Encounter for gynecological examination (general) (routine) without abnormal findings: Secondary | ICD-10-CM | POA: Diagnosis not present

## 2017-11-28 DIAGNOSIS — Z1231 Encounter for screening mammogram for malignant neoplasm of breast: Secondary | ICD-10-CM | POA: Diagnosis not present

## 2017-11-28 DIAGNOSIS — F331 Major depressive disorder, recurrent, moderate: Secondary | ICD-10-CM

## 2017-11-28 NOTE — Patient Instructions (Signed)
1.  Pap smear is done. 2.  Mammogram is ordered 3.  Screening labs are to be obtained through primary care 4.  Continue with healthy eating and exercise controlled weight loss 5.  Recommend calcium and vitamin D supplementation-1200 mg / 800 international units daily 6.  Return in 1 year for annual exam   Health Maintenance for Postmenopausal Women Menopause is a normal process in which your reproductive ability comes to an end. This process happens gradually over a span of months to years, usually between the ages of 27 and 98. Menopause is complete when you have missed 12 consecutive menstrual periods. It is important to talk with your health care provider about some of the most common conditions that affect postmenopausal women, such as heart disease, cancer, and bone loss (osteoporosis). Adopting a healthy lifestyle and getting preventive care can help to promote your health and wellness. Those actions can also lower your chances of developing some of these common conditions. What should I know about menopause? During menopause, you may experience a number of symptoms, such as:  Moderate-to-severe hot flashes.  Night sweats.  Decrease in sex drive.  Mood swings.  Headaches.  Tiredness.  Irritability.  Memory problems.  Insomnia.  Choosing to treat or not to treat menopausal changes is an individual decision that you make with your health care provider. What should I know about hormone replacement therapy and supplements? Hormone therapy products are effective for treating symptoms that are associated with menopause, such as hot flashes and night sweats. Hormone replacement carries certain risks, especially as you become older. If you are thinking about using estrogen or estrogen with progestin treatments, discuss the benefits and risks with your health care provider. What should I know about heart disease and stroke? Heart disease, heart attack, and stroke become more likely  as you age. This may be due, in part, to the hormonal changes that your body experiences during menopause. These can affect how your body processes dietary fats, triglycerides, and cholesterol. Heart attack and stroke are both medical emergencies. There are many things that you can do to help prevent heart disease and stroke:  Have your blood pressure checked at least every 1-2 years. High blood pressure causes heart disease and increases the risk of stroke.  If you are 58-67 years old, ask your health care provider if you should take aspirin to prevent a heart attack or a stroke.  Do not use any tobacco products, including cigarettes, chewing tobacco, or electronic cigarettes. If you need help quitting, ask your health care provider.  It is important to eat a healthy diet and maintain a healthy weight. ? Be sure to include plenty of vegetables, fruits, low-fat dairy products, and lean protein. ? Avoid eating foods that are high in solid fats, added sugars, or salt (sodium).  Get regular exercise. This is one of the most important things that you can do for your health. ? Try to exercise for at least 150 minutes each week. The type of exercise that you do should increase your heart rate and make you sweat. This is known as moderate-intensity exercise. ? Try to do strengthening exercises at least twice each week. Do these in addition to the moderate-intensity exercise.  Know your numbers.Ask your health care provider to check your cholesterol and your blood glucose. Continue to have your blood tested as directed by your health care provider.  What should I know about cancer screening? There are several types of cancer. Take the following  steps to reduce your risk and to catch any cancer development as early as possible. Breast Cancer  Practice breast self-awareness. ? This means understanding how your breasts normally appear and feel. ? It also means doing regular breast self-exams. Let your  health care provider know about any changes, no matter how small.  If you are 22 or older, have a clinician do a breast exam (clinical breast exam or CBE) every year. Depending on your age, family history, and medical history, it may be recommended that you also have a yearly breast X-ray (mammogram).  If you have a family history of breast cancer, talk with your health care provider about genetic screening.  If you are at high risk for breast cancer, talk with your health care provider about having an MRI and a mammogram every year.  Breast cancer (BRCA) gene test is recommended for women who have family members with BRCA-related cancers. Results of the assessment will determine the need for genetic counseling and BRCA1 and for BRCA2 testing. BRCA-related cancers include these types: ? Breast. This occurs in males or females. ? Ovarian. ? Tubal. This may also be called fallopian tube cancer. ? Cancer of the abdominal or pelvic lining (peritoneal cancer). ? Prostate. ? Pancreatic.  Cervical, Uterine, and Ovarian Cancer Your health care provider may recommend that you be screened regularly for cancer of the pelvic organs. These include your ovaries, uterus, and vagina. This screening involves a pelvic exam, which includes checking for microscopic changes to the surface of your cervix (Pap test).  For women ages 21-65, health care providers may recommend a pelvic exam and a Pap test every three years. For women ages 92-65, they may recommend the Pap test and pelvic exam, combined with testing for human papilloma virus (HPV), every five years. Some types of HPV increase your risk of cervical cancer. Testing for HPV may also be done on women of any age who have unclear Pap test results.  Other health care providers may not recommend any screening for nonpregnant women who are considered low risk for pelvic cancer and have no symptoms. Ask your health care provider if a screening pelvic exam is right  for you.  If you have had past treatment for cervical cancer or a condition that could lead to cancer, you need Pap tests and screening for cancer for at least 20 years after your treatment. If Pap tests have been discontinued for you, your risk factors (such as having a new sexual partner) need to be reassessed to determine if you should start having screenings again. Some women have medical problems that increase the chance of getting cervical cancer. In these cases, your health care provider may recommend that you have screening and Pap tests more often.  If you have a family history of uterine cancer or ovarian cancer, talk with your health care provider about genetic screening.  If you have vaginal bleeding after reaching menopause, tell your health care provider.  There are currently no reliable tests available to screen for ovarian cancer.  Lung Cancer Lung cancer screening is recommended for adults 47-57 years old who are at high risk for lung cancer because of a history of smoking. A yearly low-dose CT scan of the lungs is recommended if you:  Currently smoke.  Have a history of at least 30 pack-years of smoking and you currently smoke or have quit within the past 15 years. A pack-year is smoking an average of one pack of cigarettes per day for  one year.  Yearly screening should:  Continue until it has been 15 years since you quit.  Stop if you develop a health problem that would prevent you from having lung cancer treatment.  Colorectal Cancer  This type of cancer can be detected and can often be prevented.  Routine colorectal cancer screening usually begins at age 23 and continues through age 72.  If you have risk factors for colon cancer, your health care provider may recommend that you be screened at an earlier age.  If you have a family history of colorectal cancer, talk with your health care provider about genetic screening.  Your health care provider may also  recommend using home test kits to check for hidden blood in your stool.  A small camera at the end of a tube can be used to examine your colon directly (sigmoidoscopy or colonoscopy). This is done to check for the earliest forms of colorectal cancer.  Direct examination of the colon should be repeated every 5-10 years until age 89. However, if early forms of precancerous polyps or small growths are found or if you have a family history or genetic risk for colorectal cancer, you may need to be screened more often.  Skin Cancer  Check your skin from head to toe regularly.  Monitor any moles. Be sure to tell your health care provider: ? About any new moles or changes in moles, especially if there is a change in a mole's shape or color. ? If you have a mole that is larger than the size of a pencil eraser.  If any of your family members has a history of skin cancer, especially at a young age, talk with your health care provider about genetic screening.  Always use sunscreen. Apply sunscreen liberally and repeatedly throughout the day.  Whenever you are outside, protect yourself by wearing long sleeves, pants, a wide-brimmed hat, and sunglasses.  What should I know about osteoporosis? Osteoporosis is a condition in which bone destruction happens more quickly than new bone creation. After menopause, you may be at an increased risk for osteoporosis. To help prevent osteoporosis or the bone fractures that can happen because of osteoporosis, the following is recommended:  If you are 56-47 years old, get at least 1,000 mg of calcium and at least 600 mg of vitamin D per day.  If you are older than age 50 but younger than age 35, get at least 1,200 mg of calcium and at least 600 mg of vitamin D per day.  If you are older than age 53, get at least 1,200 mg of calcium and at least 800 mg of vitamin D per day.  Smoking and excessive alcohol intake increase the risk of osteoporosis. Eat foods that are  rich in calcium and vitamin D, and do weight-bearing exercises several times each week as directed by your health care provider. What should I know about how menopause affects my mental health? Depression may occur at any age, but it is more common as you become older. Common symptoms of depression include:  Low or sad mood.  Changes in sleep patterns.  Changes in appetite or eating patterns.  Feeling an overall lack of motivation or enjoyment of activities that you previously enjoyed.  Frequent crying spells.  Talk with your health care provider if you think that you are experiencing depression. What should I know about immunizations? It is important that you get and maintain your immunizations. These include:  Tetanus, diphtheria, and pertussis (Tdap) booster  vaccine.  Influenza every year before the flu season begins.  Pneumonia vaccine.  Shingles vaccine.  Your health care provider may also recommend other immunizations. This information is not intended to replace advice given to you by your health care provider. Make sure you discuss any questions you have with your health care provider. Document Released: 08/12/2005 Document Revised: 01/08/2016 Document Reviewed: 03/24/2015 Elsevier Interactive Patient Education  2018 [employer].

## 2017-11-30 ENCOUNTER — Other Ambulatory Visit: Payer: Self-pay | Admitting: Family Medicine

## 2017-11-30 DIAGNOSIS — F900 Attention-deficit hyperactivity disorder, predominantly inattentive type: Secondary | ICD-10-CM

## 2017-11-30 DIAGNOSIS — F5081 Binge eating disorder: Secondary | ICD-10-CM

## 2017-11-30 DIAGNOSIS — F331 Major depressive disorder, recurrent, moderate: Secondary | ICD-10-CM

## 2017-11-30 NOTE — Telephone Encounter (Signed)
Copied from [place] [phone]. Topic: Quick Communication - Rx Refill/Question >> Nov 30, 2017  9:23 AM Scherrie Gerlach wrote: Medication: lisdexamfetamine (VYVANSE) 50 MG capsule  Has the patient contacted their pharmacy? Yes Pt states she took her last pill today [place], [place] - [address] [phone] (Phone) [phone] (Fax)  Pt sent my chart message 5/28

## 2017-12-07 ENCOUNTER — Telehealth: Payer: Self-pay | Admitting: Obstetrics and Gynecology

## 2017-12-07 LAB — IGP, COBASHPV16/18
HPV 16: NEGATIVE
HPV 18: NEGATIVE
HPV other hr types: POSITIVE — AB
PAP Smear Comment: 0

## 2017-12-07 LAB — SPECIMEN STATUS REPORT

## 2017-12-07 NOTE — Telephone Encounter (Signed)
The patient called and stated that she would like to speak with Crystal in regards to a MyChart message Dr. Tennis Must sent her, and the patient stated that she is to come in for a colposcopy, I could not find any documentation so I informed the patient that you would give her a call back. Please advise.

## 2017-12-07 NOTE — Telephone Encounter (Signed)
See pap results.

## 2017-12-12 ENCOUNTER — Encounter: Payer: Self-pay | Admitting: Family Medicine

## 2017-12-12 ENCOUNTER — Ambulatory Visit (INDEPENDENT_AMBULATORY_CARE_PROVIDER_SITE_OTHER): Payer: 59 | Admitting: Family Medicine

## 2017-12-12 VITALS — BP 114/68 | HR 83 | Temp 98.2°F | Resp 16 | Ht 63.0 in | Wt 266.6 lb

## 2017-12-12 DIAGNOSIS — F5081 Binge eating disorder: Secondary | ICD-10-CM

## 2017-12-12 DIAGNOSIS — F50819 Binge eating disorder, unspecified: Secondary | ICD-10-CM

## 2017-12-12 DIAGNOSIS — F331 Major depressive disorder, recurrent, moderate: Secondary | ICD-10-CM

## 2017-12-12 DIAGNOSIS — F900 Attention-deficit hyperactivity disorder, predominantly inattentive type: Secondary | ICD-10-CM | POA: Diagnosis not present

## 2017-12-12 DIAGNOSIS — E039 Hypothyroidism, unspecified: Secondary | ICD-10-CM

## 2017-12-12 DIAGNOSIS — Z6841 Body Mass Index (BMI) 40.0 and over, adult: Secondary | ICD-10-CM

## 2017-12-12 DIAGNOSIS — R7303 Prediabetes: Secondary | ICD-10-CM | POA: Diagnosis not present

## 2017-12-12 LAB — TSH: TSH: 1.43 mIU/L

## 2017-12-12 MED ORDER — VENLAFAXINE HCL ER 37.5 MG PO CP24: 37.5000 mg | ORAL_CAPSULE | Freq: Every day | ORAL | 0 refills | Status: DC

## 2017-12-12 MED ORDER — LEVOTHYROXINE SODIUM 150 MCG PO TABS: 150.0000 ug | ORAL_TABLET | Freq: Every day | ORAL | 0 refills | Status: DC

## 2017-12-12 MED ORDER — LISDEXAMFETAMINE DIMESYLATE 50 MG PO CAPS: 50.0000 mg | ORAL_CAPSULE | Freq: Every day | ORAL | 0 refills | Status: DC

## 2017-12-12 NOTE — Progress Notes (Signed)
Name: Dana Whitaker   MRN: [id number]    DOB: 12/25/1975   Date:12/12/2017       Progress Note  Subjective  Chief Complaint  Chief Complaint  Patient presents with  . Medication Refill    3 month F/U  . ADHD  . Hypothyroidism  . Depression    HPI  Obesity: she has a long history of morbid obesity. Maximum of 298 lbs Dec 2017 prior to gastric sleeve surgery, she lost down to 238 lbs June 2018 but gradually gained back. She had labs done June 2018 and TSH was above 50 but she was not notified, and January at 42 , she was started on Synthroid 150 mcg .She has been walking 30 minutes daily with a co-worker, but not last week. Vyvanse has helped curb her appetite a little.   Hypothyroidism: she states that since June 2018 she has gained weight, she has also noticed hair loss, fatigue and constipation. We will recheck labs.   Major depression: she states on an off her entire life. She states between 2012-2016 she found out that her husband was having an affair, her father died in 42. She was working full time and going to college at the same time. She states she took medication at the time, Effexor helped in the past and is willing to try it again. She was seeing a therapist. She is still with her husband, but she does not trust him. Raising his 41 yo daughter. She states currently seeing someone else. He has cheated on him many times in the past. She states they live together ( but not physically together - husband and her)   ADHD: son also has ADHD, diagnosed by Dr. Leonides Schanz, she took Ritalin in the past. She has symptoms of binge eating disorder - over eats, feels guilty but unable to stop. Has obesity. She states she can eat a box of chocolate in one sitting. In the past she could eat a bag of sweet sixteen donuts in a row and 4 regular size donuts, followed by guilty feeling. She said has always been present in her life. She is on Vyvanse and states it has curbed her appetite, her weight is  about the same. Lost only 6 lbs since last visit.   Abnormal pap smear: she sees Dr. Enzo Bi, and Dr. Perlie Gold at cancer center, last pap smear was abnormal and is going for colposcopy soon. History of endometrial cancer   Patient Active Problem List   Diagnosis Date Noted  . Hypothyroidism 11/28/2017  . Urgency of urination 11/10/2016  . Surgical menopause 11/10/2016  . Cyst of left breast 08/30/2016  . Endometrial cancer ([place]) 06/29/2016  . 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 Fatty liver 06/13/2016  . Bilateral carpal tunnel syndrome 06/13/2016  . Status post bariatric surgery 06/13/2016  . Hypertriglyceridemia 05/19/2016  . History of fracture of left ankle 05/19/2016  . Former cigarette smoker 02/17/2016  . Prediabetes 02/17/2016  . Status post laparoscopic hysterectomy 11/10/2015  . Morbid obesity with BMI of 50.0-59.9, adult ([place]) 11/10/2015  . Borderline personality disorder ([place]) 01/13/2015  . ADHD (attention deficit hyperactivity disorder), combined type 01/13/2015  . Apnea, sleep 01/13/2015  . Insomnia, persistent 01/13/2015  . Depression, major, recurrent, moderate ([facility]) 01/13/2015  . Fibromyalgia 01/13/2015  . Classical migraine with intractable migraine 09/12/2014  . 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 ABDOMINAL HYSTERECTOMY     [place].bso  . CARPAL TUNNEL RELEASE  pt states did not have surgery just injections   . LAPAROSCOPIC GASTRIC SLEEVE RESECTION N/A 06/13/2016   Procedure: LAPAROSCOPIC GASTRIC SLEEVE RESECTION WITH HIATAL HERNIA REPAIR AND UPPER ENDOSCOPY;  Surgeon: Greer Pickerel, MD;  Location: [facility];  Service: General;  Laterality: N/A;  . LEEP    . TONSILLECTOMY    . VARICOSE VEIN SURGERY      Family History  Problem Relation Age of Onset  . Hyperlipidemia Mother   . Bipolar disorder Mother   . Heart attack Father   . Drug abuse Father   . Anxiety disorder Sister   . Depression  Sister   . Anxiety disorder Sister   . Depression Sister   . ADD / ADHD Sister   . Alcohol abuse Sister   . Thyroid disease Maternal Grandmother   . Diabetes Maternal Grandmother   . Stroke Maternal Grandmother   . Ovarian cancer Neg Hx   . Breast cancer Neg Hx   . Colon cancer Neg Hx     Social History   Socioeconomic History  . Marital status: Married    Spouse name: Not on file  . Number of children: 1  . Years of education: Not on file  . Highest education level: Associate degree: academic program  Occupational History  . Occupation: Optometrist  Social Needs  . Financial resource strain: Not on file  . Food insecurity:    Worry: Not on file    Inability: Not on file  . Transportation needs:    Medical: Not on file    Non-medical: Not on file  Tobacco Use  . Smoking status: Former Smoker    Packs/day: 1.00    Years: 23.00    Pack years: 23.00    Types: Cigarettes    Start date: 02/05/1994    Last attempt to quit: 05/10/2016    Years since quitting: 1.5  . Smokeless tobacco: Never Used  Substance and Sexual Activity  . Alcohol use: Yes    Alcohol/week: 0.0 oz    Comment: occas  . Drug use: No  . Sexual activity: Yes    Birth control/protection: Surgical  Lifestyle  . Physical activity:    Days per week: 0 days    Minutes per session: 0 min  . Stress: Not on file  Relationships  . Social connections:    Talks on phone: Not on file    Gets together: Not on file    Attends religious service: Not on file    Active member of club or organization: Not on file    Attends meetings of clubs or organizations: Not on file    Relationship status: Not on file  . Intimate partner violence:    Fear of current or ex partner: Not on file    Emotionally abused: Not on file    Physically abused: Not on file    Forced sexual activity: Not on file  Other Topics Concern  . Not on file  Social History Narrative   Married,    Has a grown son, raising a 41 yo step-daughter    Not in a good marital situation     Current Outpatient Medications:  .  Calcium Carbonate-Vit D-Min (CALTRATE 600+D PLUS MINERALS) 600-800 MG-UNIT CHEW, Chew by mouth., Disp: , Rfl:  .  levothyroxine (SYNTHROID, LEVOTHROID) 150 MCG tablet, Take 1 tablet (150 mcg total) by mouth daily before breakfast., Disp: 90 tablet, Rfl: 0 .  Multiple Vitamin (MULTIVITAMIN) capsule, Take 1 capsule by mouth daily.,  Disp: , Rfl:  .  VYVANSE 50 MG capsule, TAKE 1 CAPSULE BY MOUTH ONCE DAILY, Disp: 30 capsule, Rfl: 0  Allergies  Allergen Reactions  . Ciprofloxacin Hives  . Tramadol Rash     ROS  Constitutional: Negative for fever or weight change.  Respiratory: Negative for cough and shortness of breath.   Cardiovascular: Negative for chest pain or palpitations.  Gastrointestinal: Negative for abdominal pain, no bowel changes.  Musculoskeletal: Negative for gait problem or joint swelling.  Skin: Negative for rash.  Neurological: Negative for dizziness or headache.  No other specific complaints in a complete review of systems (except as listed in HPI above).  Objective  Vitals:   12/12/17 0840  BP: 114/68  Pulse: 83  Resp: 16  Temp: 98.2 F (36.8 C)  TempSrc: Oral  SpO2: 95%  Weight: 266 lb 9.6 oz (120.9 kg)  Height: 5\' 3"  (1.6 m)    Body mass index is 47.23 kg/m.  Physical Exam  Constitutional: Patient appears well-developed and well-nourished. Obese  No distress.  HEENT: head atraumatic, normocephalic, pupils equal and reactive to light, neck supple, throat within normal limits Cardiovascular: Normal rate, regular rhythm and normal heart sounds.  No murmur heard. No BLE edema. Pulmonary/Chest: Effort normal and breath sounds normal. No respiratory distress. Abdominal: Soft.  There is no tenderness. Psychiatric: Patient has a normal mood and affect. behavior is normal. Judgment and thought content normal.   Recent Results (from the past 2160 hour(s))  IGP, cobasHPV16/18      Status: Abnormal   Collection Time: 11/28/17 11:09 AM  Result Value Ref Range   DIAGNOSIS: Comment (A)     Comment: EPITHELIAL CELL ABNORMALITY. LOW GRADE SQUAMOUS INTRAEPITHELIAL LESION (LSIL).    Specimen adequacy: Comment     Comment: Satisfactory for evaluation. Endocervical and/or squamous metaplastic cells (endocervical component) are present.    Clinician Provided ICD10 Comment     Comment: Z01.419   Performed by: Comment     Comment: Migdalia Dk, Supervisory Cytotechnologist (ASCP)   Electronically signed by: Comment     Comment: Starleen Blue, MD, Pathologist   PAP Smear Comment .    PATHOLOGIST PROVIDED ICD10: Comment     Comment: R87.612   Note: Comment     Comment: The Pap smear is a screening test designed to aid in the detection of premalignant and malignant conditions of the uterine cervix.  It is not a diagnostic procedure and should not be used as the sole means of detecting cervical cancer.  Both false-positive and false-negative reports do occur.    Test Methodology Comment     Comment: This liquid based ThinPrep(R) pap test was screened with the use of an image guided system.    HPV other hr types Positive (A) Negative   HPV 16 Negative Negative   HPV 18 Negative Negative    Comment: This test detects fourteen high-risk HPV types: HPV16, HPV18 and twelve other high-risk types (31, 33, 35, 39, 45, 51, 52, 56, 58, 59, 66, 68) without differentiation.   Specimen status report     Status: None   Collection Time: 11/28/17 11:09 AM  Result Value Ref Range   specimen status report Comment     Comment: One Specimen Identifier One Specimen Identifier The specimen received included only one patient identifier on the primary collection container.  Our laboratory accrediting agency states "All primary specimen containers must be labeled with 2 identifiers at the time of collection."  PHQ2/9: Depression screen [facility] 2/9 12/12/2017 09/20/2017  09/20/2017 08/30/2016 08/22/2016  Decreased Interest 2 0 2 0 0  Down, Depressed, Hopeless 2 0 2 0 0  PHQ - 2 Score 4 0 4 0 0  Altered sleeping 3 1 2  - -  Tired, decreased energy 2 1 3  - -  Change in appetite 2 0 2 - -  Feeling bad or failure about yourself  2 0 0 - -  Trouble concentrating 1 0 2 - -  Moving slowly or fidgety/restless 0 0 0 - -  Suicidal thoughts 0 0 0 - -  PHQ-9 Score 14 2 13  - -  Difficult doing work/chores Somewhat difficult Not difficult at all Not difficult at all - -     Fall Risk: Fall Risk  12/12/2017 09/20/2017 08/22/2017 08/30/2016 08/22/2016  Falls in the past year? No Yes No No No  Number falls in past yr: - 2 or more - - -  Injury with Fall? - Yes - - -  Comment - - - - -  Risk Factor Category  - High Fall Risk - - -  Risk for fall due to : - - - - -  Risk for fall due to: Comment - - - - -  Follow up - - - - -     Functional Status Survey: Is the patient deaf or have difficulty hearing?: No Does the patient have difficulty seeing, even when wearing glasses/contacts?: No Does the patient have difficulty concentrating, remembering, or making decisions?: Yes(sometimes with remembering) Does the patient have difficulty walking or climbing stairs?: No Does the patient have difficulty dressing or bathing?: No Does the patient have difficulty doing errands alone such as visiting a doctor's office or shopping?: No    Assessment & Plan  1. Major depressive disorder, recurrent episode, moderate (HCC)  - venlafaxine XR (EFFEXOR XR) 37.5 MG 24 hr capsule; Take 1-2 capsules (37.5-75 mg total) by mouth daily with breakfast. Take one daily for first week , after that 2 daily  Dispense: 60 capsule; Refill: 0 - lisdexamfetamine (VYVANSE) 50 MG capsule; Take 1 capsule (50 mg total) by mouth daily.  Dispense: 30 capsule; Refill: 0 She works and will send me a message on my chart to let me know if medication is working and if so she will follow up in 3 months  2.  Morbid obesity with BMI of 50.0-59.9, adult [facility])  Discussed with the patient the risk posed by an increased BMI. Discussed importance of portion control, calorie counting and at least 150 minutes of physical activity weekly. Avoid sweet beverages and drink more water. Eat at least 6 servings of fruit and vegetables daily   3. Attention deficit hyperactivity disorder (ADHD), predominantly inattentive type  - lisdexamfetamine (VYVANSE) 50 MG capsule; Take 1 capsule (50 mg total) by mouth daily.  Dispense: 30 capsule; Refill: 0  4. Binge eating disorder  - lisdexamfetamine (VYVANSE) 50 MG capsule; Take 1 capsule (50 mg total) by mouth daily.  Dispense: 30 capsule; Refill: 0  5. Adult hypothyroidism  - TSH - Levothyroxine refill   6. Prediabetes  Discussed life style modification again

## 2017-12-14 ENCOUNTER — Other Ambulatory Visit: Payer: Self-pay | Admitting: Family Medicine

## 2017-12-14 DIAGNOSIS — E039 Hypothyroidism, unspecified: Secondary | ICD-10-CM

## 2018-01-02 ENCOUNTER — Ambulatory Visit (INDEPENDENT_AMBULATORY_CARE_PROVIDER_SITE_OTHER): Payer: 59 | Admitting: Obstetrics and Gynecology

## 2018-01-02 ENCOUNTER — Other Ambulatory Visit (HOSPITAL_COMMUNITY)
Admission: RE | Admit: 2018-01-02 | Discharge: 2018-01-02 | Disposition: A | Discharge status: Home / Self Care | Payer: 59 | Source: Ambulatory Visit | Attending: Obstetrics and Gynecology | Admitting: Obstetrics and Gynecology

## 2018-01-02 ENCOUNTER — Encounter: Payer: Self-pay | Admitting: Obstetrics and Gynecology

## 2018-01-02 VITALS — BP 138/84 | HR 64 | Ht 63.0 in | Wt 264.6 lb

## 2018-01-02 DIAGNOSIS — R87811 Vaginal high risk human papillomavirus (HPV) DNA test positive: Secondary | ICD-10-CM | POA: Diagnosis present

## 2018-01-02 DIAGNOSIS — N890 Mild vaginal dysplasia: Secondary | ICD-10-CM | POA: Insufficient documentation

## 2018-01-02 DIAGNOSIS — R87622 Low grade squamous intraepithelial lesion on cytologic smear of vagina (LGSIL): Secondary | ICD-10-CM | POA: Insufficient documentation

## 2018-01-02 NOTE — Patient Instructions (Signed)
1.  Return for Pap smear and annual exam next year. 2.  If high-grade dysplasia is identified on biopsy, follow-up appointment will be made for further treatment

## 2018-01-02 NOTE — Addendum Note (Signed)
Addended by: Elouise Munroe on: 01/02/2018 09:15 AM   Modules accepted: Orders

## 2018-01-02 NOTE — Progress Notes (Signed)
Chief complaint: 1.  Colposcopy 2.  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  LVSI not identified. Status post TLH BSO 2015 due to endometrioid adenocarcinoma Surgical menopause  Abnormal Pap smear history: 11/06/2014 Pap/HPV negative/negative 06/2016 Pap negative 11/28/2017 Pap/HPV LGSIL/positive HPV other HR types 01/02/2018 colposcopy of vagina with biopsy Indications: LGSIL Pap smear with positive high-risk HPV-other types Findings: 7 mm warty lesion left vaginal sidewall, biopsied Biopsies: Left vaginal sidewall  OBJECTIVE: BP 138/84   Pulse 64   Ht 5\' 3"  (1.6 m)   Wt 264 lb 9.6 oz (120 kg)   LMP 08/04/2013   BMI 46.87 kg/m  Pleasant female no acute distress.  Alert and oriented. Extremities: Multiple skin tags on inner thighs Pelvic exam: External genitalia-normal BUS-normal Vagina-grossly normal Cervix-surgically absent Uterus-surgically absent Rectovaginal-normal  PROCEDURE: Colposcopy of vagina with biopsy ndications: LGSIL Pap smear with positive high-risk HPV-other types Findings: 7 mm warty lesion left vaginal sidewall, biopsied Biopsies: Left vaginal sidewall Description: Verbal consent is obtained.  Patient was placed in dorsolithotomy position.  Graves speculum was placed in the vagina to facilitate visualization of the vagina.  Fox swabs coated with acetic acid solution used to paint the vagina.  Colposcopy is performed with visualization of a 7 mm left vaginal sidewall lesion-polypoid.  Biopsy is obtained.  Monsel solution is applied for hemostasis.  Blood loss is minimal.  Complications are none.  Procedure is well-tolerated.  ASSESSMENT: 1.  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   Colposcopy notable for 7 mm wartlike lesion left vaginal sidewall-removed with biopsy 3.  History of well differentiated endometrioid adenocarcinoma, status post TLH BSO  PLAN: 1.  Colposcopy with biopsy as noted 2.  Patient be notified if high-grade dysplasia is identified so further management planning can be pursued 3.  Return in 1 year for regular appointment including Pap smear 4.  Continue with follow-up with GYN oncology as scheduled for monitoring of endometrioid adenocarcinoma  Brayton Mars, MD  Note: This dictation was prepared with Dragon dictation along with smaller phrase technology. Any transcriptional errors that result from this process are unintentional.

## 2018-01-05 ENCOUNTER — Encounter: Payer: Self-pay | Admitting: Family Medicine

## 2018-01-09 ENCOUNTER — Encounter: Payer: Self-pay | Admitting: Obstetrics and Gynecology

## 2018-01-09 DIAGNOSIS — N893 Dysplasia of vagina, unspecified: Secondary | ICD-10-CM | POA: Insufficient documentation

## 2018-01-24 ENCOUNTER — Ambulatory Visit
Admission: RE | Admit: 2018-01-24 | Discharge: 2018-01-24 | Disposition: A | Discharge status: Home / Self Care | Payer: 59 | Source: Ambulatory Visit | Attending: Obstetrics and Gynecology | Admitting: Obstetrics and Gynecology

## 2018-01-24 DIAGNOSIS — Z1231 Encounter for screening mammogram for malignant neoplasm of breast: Secondary | ICD-10-CM | POA: Insufficient documentation

## 2018-01-29 ENCOUNTER — Telehealth: Payer: Self-pay | Admitting: Obstetrics and Gynecology

## 2018-01-29 NOTE — Telephone Encounter (Signed)
Pt aware of bx results. Pt aware no f/u at this time. Prefers 6 month f/u. Appt scheduled.

## 2018-01-29 NOTE — Telephone Encounter (Signed)
The patient called and stated that she would like to speak with Crystal in regards to her receiving her biopsy results online and no t hearing anything back yet in regards to what needs to be scheduled.plan moving forward. Please advise.

## 2018-03-14 ENCOUNTER — Ambulatory Visit: Payer: Self-pay | Admitting: Family Medicine

## 2018-04-09 ENCOUNTER — Ambulatory Visit: Payer: Self-pay | Admitting: Family Medicine

## 2018-05-22 ENCOUNTER — Encounter: Payer: Self-pay | Admitting: Family Medicine

## 2018-06-06 ENCOUNTER — Encounter: Payer: Self-pay | Admitting: Obstetrics and Gynecology

## 2018-06-06 ENCOUNTER — Other Ambulatory Visit (HOSPITAL_COMMUNITY)
Admission: RE | Admit: 2018-06-06 | Discharge: 2018-06-06 | Disposition: A | Discharge status: Home / Self Care | Payer: BLUE CROSS/BLUE SHIELD | Source: Ambulatory Visit | Attending: Obstetrics and Gynecology | Admitting: Obstetrics and Gynecology

## 2018-06-06 ENCOUNTER — Ambulatory Visit (INDEPENDENT_AMBULATORY_CARE_PROVIDER_SITE_OTHER): Payer: BLUE CROSS/BLUE SHIELD | Admitting: Obstetrics and Gynecology

## 2018-06-06 VITALS — BP 128/83 | HR 71 | Ht 63.0 in | Wt 285.8 lb

## 2018-06-06 DIAGNOSIS — E8940 Asymptomatic postprocedural ovarian failure: Secondary | ICD-10-CM | POA: Diagnosis not present

## 2018-06-06 DIAGNOSIS — R87622 Low grade squamous intraepithelial lesion on cytologic smear of vagina (LGSIL): Secondary | ICD-10-CM | POA: Insufficient documentation

## 2018-06-06 DIAGNOSIS — C55 Malignant neoplasm of uterus, part unspecified: Secondary | ICD-10-CM | POA: Diagnosis not present

## 2018-06-06 DIAGNOSIS — N893 Dysplasia of vagina, unspecified: Secondary | ICD-10-CM | POA: Diagnosis not present

## 2018-06-06 NOTE — Progress Notes (Signed)
Chief complaint: 1.  Vaginal dysplasia, mild 2.  History of endometrial cancer 3.  Status post TLH BSO  Dana Whitaker presents today for six-month follow-up.  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  She reports no interval issues.  Bowel function is normal.  Bladder function is normal.  She is not experiencing any vaginal discharge or vaginal bleeding.  Gynecologic History Patient's last menstrual period was 08/04/2013. Contraception: status post hysterectomy Last Pap: 11/06/2014 NEG/NEG. 06/2016 neg Results were: normal Last mammogram: 12/14/2016 birad 1 Results were: N/A  07/2013 EMB reveals a well differentiated endometrioid adenocarcinoma 09/10/2013 TLHBSO for grade 1 endometrioid endometrial cancer. Tumor size 0.4 cm, no myometrial invasion. LVSI not identified.   Past medical history, past surgical history, problem list, medications, and allergies are reviewed  OBJECTIVE: BP 128/83   Pulse 71   Ht 5\' 3"  (1.6 m)   Wt 285 lb 12.8 oz (129.6 kg)   LMP 08/04/2013   BMI 50.63 kg/m  Pleasant well-appearing female in distress.  Alert and oriented.  Affect appropriate. Abdomen: Soft, nontender with Pelvic exam: External genitalia-normal BUS-normal Vagina-normal-appearing mucosa without lesions; vaginal cuff is intact without lesions Cervix-surgically absent Uterus-surgically absent Adnexa-nonpalpable nontender Rectovaginal-normal external exam  ASSESSMENT: 1.  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  Normal exam today without evidence of vaginal or vulvar lesions 3.  History of endometrioid adenocarcinoma, status post TLH BSO, currently NED  PLAN: 1.  Pap smear 2.  Return in July 2020 for annual exam-Dr. Amalia Hailey 3.  Continue with GYN oncology follow-up with Dr. Theora Gianotti.  Dana Mars, MD  Note: This dictation was prepared with Dragon dictation along with smaller phrase  technology. Any transcriptional errors that result from this process are unintentional.

## 2018-06-06 NOTE — Patient Instructions (Signed)
1.  Pap smear is done today. 2.  Return in 6 months for annual exam and Pap smear. 3.  Patient understands that colposcopy may be needed if moderate dysplasia or worse is identified on Pap smear. 4.  Continue with GYN oncology follow-up with Dr. Theora Gianotti as scheduled

## 2018-06-11 LAB — CYTOLOGY - PAP
DIAGNOSIS: NEGATIVE
HPV (WINDOPATH): NOT DETECTED

## 2018-06-13 ENCOUNTER — Encounter: Payer: Self-pay | Admitting: Family Medicine

## 2018-06-13 ENCOUNTER — Ambulatory Visit (INDEPENDENT_AMBULATORY_CARE_PROVIDER_SITE_OTHER): Payer: BLUE CROSS/BLUE SHIELD | Admitting: Family Medicine

## 2018-06-13 VITALS — BP 110/80 | HR 82 | Temp 97.6°F | Resp 16 | Ht 63.0 in | Wt 288.8 lb

## 2018-06-13 DIAGNOSIS — E039 Hypothyroidism, unspecified: Secondary | ICD-10-CM

## 2018-06-13 DIAGNOSIS — F320 Major depressive disorder, single episode, mild: Secondary | ICD-10-CM | POA: Diagnosis not present

## 2018-06-13 DIAGNOSIS — R5383 Other fatigue: Secondary | ICD-10-CM

## 2018-06-13 DIAGNOSIS — Z23 Encounter for immunization: Secondary | ICD-10-CM | POA: Diagnosis not present

## 2018-06-13 DIAGNOSIS — Z6841 Body Mass Index (BMI) 40.0 and over, adult: Secondary | ICD-10-CM

## 2018-06-13 DIAGNOSIS — E781 Pure hyperglyceridemia: Secondary | ICD-10-CM

## 2018-06-13 DIAGNOSIS — R2241 Localized swelling, mass and lump, right lower limb: Secondary | ICD-10-CM

## 2018-06-13 MED ORDER — LEVOTHYROXINE SODIUM 150 MCG PO TABS: 150.0000 ug | ORAL_TABLET | Freq: Every day | ORAL | 0 refills | Status: DC

## 2018-06-13 NOTE — Progress Notes (Addendum)
Name: Dana Whitaker   MRN: [id number]    DOB: [birth year]   Date:06/13/2018       Progress Note  Subjective  Chief Complaint  Chief Complaint  Patient presents with  . Depression  . ADHD  . Hypothyroidism    HPI  Obesity: she has a long history of morbid obesity. Maximum of 298 lbs Dec 2017 prior to gastric sleeve surgery, she lost down to 238 lbs June 2018 but gradually gained back. She tried Vyvanse for binge eating but kept her awake at night, she states she has not been binge eating lately, she is trying to resume physical activity.   Hypothyroidism: she skips medications all the time, states not taking it for the past month, weight has gone up a little, feeling tired ( but thinks because she is taking care of an infant). She has dry skin, that improves with lotion, no change in bowel movements.   Major depression: she states on an off her entire life. She states between 2012-2016 she found out that her husband was having an affair, her father died in 37. She was working full time and going to college at the same time. She states she took medication at the time, Effexor helped in the past but she tried going back on it Summer of 2019 and it made her feel very sleepy. She stopped it on her own. She is raising her 54 yo step-daughter and fostering her step daughter's sister while their mom is in prison. She is feeling tired but seems to be otherwise doing well.   ADHD: son also has ADHD, diagnosed by Dr. Leonides Schanz, she took Ritalin in the past. Currently off medication. Vyvanse did not work well for her, she states she has been doing well at work at this time.   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   URI: recovering from an URI, last week had conjunctivitis, but that resolved, she has a dry cough, no rhinorrhea, has mild sore throat.   Right leg mass: noticed about one week ago, painful to  touch below right knee, no change in color   Patient Active Problem List   Diagnosis Date Noted  . Vaginal dysplasia 01/09/2018  . LGSIL Pap smear of vagina 01/02/2018  . Vaginal high risk HPV DNA test positive 01/02/2018  . Hypothyroidism 11/28/2017  . Urgency of urination 11/10/2016  . Surgical menopause 11/10/2016  . Cyst of left breast 08/30/2016  . History of malignant neoplasm of endometrium 06/29/2016  . 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 Fatty liver 06/13/2016  . Bilateral carpal tunnel syndrome 06/13/2016  . Status post bariatric surgery 06/13/2016  . Hypertriglyceridemia 05/19/2016  . History of fracture of left ankle 05/19/2016  . Former cigarette smoker 02/17/2016  . Prediabetes 02/17/2016  . Status post laparoscopic hysterectomy 11/10/2015  . Morbid obesity with BMI of 50.0-59.9, adult ([place]) 11/10/2015  . Borderline personality disorder ([place]) 01/13/2015  . ADHD (attention deficit hyperactivity disorder), combined type 01/13/2015  . Apnea, sleep 01/13/2015  . Insomnia, persistent 01/13/2015  . Depression, major, recurrent, moderate ([place]) 01/13/2015  . Fibromyalgia 01/13/2015  . Classical migraine with intractable migraine 09/12/2014  . 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 ABDOMINAL HYSTERECTOMY     [place].bso  . CARPAL TUNNEL RELEASE     pt states did not have surgery just injections   .  LAPAROSCOPIC GASTRIC SLEEVE RESECTION N/A 06/13/2016   Procedure: LAPAROSCOPIC GASTRIC SLEEVE RESECTION WITH HIATAL HERNIA REPAIR AND UPPER ENDOSCOPY;  Surgeon: Greer Pickerel, MD;  Location: [facility];  Service: General;  Laterality: N/A;  . LEEP    . TONSILLECTOMY    . VARICOSE VEIN SURGERY      Family History  Problem Relation Age of Onset  . Hyperlipidemia Mother   . Bipolar disorder Mother   . Heart attack Father   . Drug abuse Father   . Anxiety disorder Sister   . Depression Sister   . Anxiety disorder Sister    . Depression Sister   . ADD / ADHD Sister   . Alcohol abuse Sister   . Thyroid disease Maternal Grandmother   . Diabetes Maternal Grandmother   . Stroke Maternal Grandmother   . Ovarian cancer Neg Hx   . Breast cancer Neg Hx   . Colon cancer Neg Hx     Social History   Socioeconomic History  . Marital status: Married    Spouse name: Not on file  . Number of children: 1  . Years of education: Not on file  . Highest education level: Associate degree: academic program  Occupational History  . Occupation: Optometrist  Social Needs  . Financial resource strain: Not on file  . Food insecurity:    Worry: Not on file    Inability: Not on file  . Transportation needs:    Medical: Not on file    Non-medical: Not on file  Tobacco Use  . Smoking status: Former Smoker    Packs/day: 1.00    Years: 23.00    Pack years: 23.00    Types: Cigarettes    Start date: 02/05/1994    Last attempt to quit: 05/10/2016    Years since quitting: 2.0  . Smokeless tobacco: Never Used  Substance and Sexual Activity  . Alcohol use: Yes    Alcohol/week: 0.0 standard drinks    Comment: occas  . Drug use: No  . Sexual activity: Yes    Birth control/protection: Surgical  Lifestyle  . Physical activity:    Days per week: 0 days    Minutes per session: 0 min  . Stress: Not on file  Relationships  . Social connections:    Talks on phone: More than three times a week    Gets together: Once a week    Attends religious service: More than 4 times per year    Active member of club or organization: Yes    Attends meetings of clubs or organizations: More than 4 times per year    Relationship status: Married  . Intimate partner violence:    Fear of current or ex partner: No    Emotionally abused: No    Physically abused: No    Forced sexual activity: No  Other Topics Concern  . Not on file  Social History Narrative   Married,    Has a grown son, raising a 79 yo step-daughter, also fostering a baby  girl    Not in a good marital situation, but improving      Current Outpatient Medications:  .  levothyroxine (SYNTHROID, LEVOTHROID) 150 MCG tablet, Take 1 tablet (150 mcg total) by mouth daily with breakfast., Disp: 90 tablet, Rfl: 0  Allergies  Allergen Reactions  . Ciprofloxacin Hives  . Tramadol Rash    I personally reviewed active problem list, medication list, allergies, family history, social history with the patient/caregiver today.  ROS  Constitutional: Negative for fever or weight change.  Respiratory: Positive  for cough but no  shortness of breath.   Cardiovascular: Negative for chest pain or palpitations.  Gastrointestinal: Negative for abdominal pain, no bowel changes.  Musculoskeletal: Negative for gait problem or joint swelling.  Skin: Negative for rash.  Neurological: Negative for dizziness or headache.  No other specific complaints in a complete review of systems (except as listed in HPI above).  Objective  Vitals:   06/13/18 0806  BP: 110/80  Pulse: 82  Resp: 16  Temp: 97.6 F (36.4 C)  TempSrc: Oral  SpO2: 99%  Weight: 288 lb 12.8 oz (131 kg)  Height: 5\' 3"  (1.6 m)    Body mass index is 51.16 kg/m.  Physical Exam  Constitutional: Patient appears well-developed and well-nourished. Obese  No distress.  HEENT: head atraumatic, normocephalic, pupils equal and reactive to light, neck supple, throat within normal limits Cardiovascular: Normal rate, regular rhythm and normal heart sounds.  No murmur heard. No BLE edema. Pulmonary/Chest: Effort normal and breath sounds normal. No respiratory distress. Abdominal: Soft.  There is no tenderness. Muscular skeletal: pain and swelling on left lower leg ( below medial right knee) no bruising, tender to touch, possible lipoma Psychiatric: Patient has a normal mood and affect. behavior is normal. Judgment and thought content normal.  Recent Results (from the past 2160 hour(s))  Cytology - PAP     Status:  None   Collection Time: 06/06/18 12:00 AM  Result Value Ref Range   Adequacy Satisfactory for evaluation.    Diagnosis      NEGATIVE FOR INTRAEPITHELIAL LESIONS OR MALIGNANCY. BENIGN REACTIVE/REPARATIVE CHANGES.   HPV NOT DETECTED     Comment: Normal Reference Range - NOT Detected   Material Submitted Vaginal Pap [ThinPrep Imaged]       PHQ2/9: Depression screen [facility] 2/9 06/13/2018 12/12/2017 09/20/2017 09/20/2017 08/30/2016  Decreased Interest 0 2 0 2 0  Down, Depressed, Hopeless 0 2 0 2 0  PHQ - 2 Score 0 4 0 4 0  Altered sleeping 0 3 1 2  -  Tired, decreased energy 1 2 1 3  -  Change in appetite 2 2 0 2 -  Feeling bad or failure about yourself  0 2 0 0 -  Trouble concentrating 0 1 0 2 -  Moving slowly or fidgety/restless 0 0 0 0 -  Suicidal thoughts - 0 0 0 -  PHQ-9 Score 3 14 2 13  -  Difficult doing work/chores Not difficult at all Somewhat difficult Not difficult at all Not difficult at all -     Fall Risk: Fall Risk  06/13/2018 12/12/2017 09/20/2017 08/22/2017 08/30/2016  Falls in the past year? 0 No Yes No No  Number falls in past yr: - - 2 or more - -  Injury with Fall? - - Yes - -  Comment - - - - -  Risk Factor Category  - - High Fall Risk - -  Risk for fall due to : - - - - -  Risk for fall due to: Comment - - - - -  Follow up - - - - -     Assessment & Plan   1. Mild major depression ([place])  She is doing better   2. Flu vaccine need  - Flu Vaccine QUAD 36+ mos IM  3. Morbid obesity with BMI of 50.0-59.9, adult [facility])  Discussed with the patient the risk posed by an increased BMI. Discussed importance of  portion control, calorie counting and at least 150 minutes of physical activity weekly. Avoid sweet beverages and drink more water. Eat at least 6 servings of fruit and vegetables daily   4. Hypothyroidism (acquired)  She has not been taking medication for the past month, states skips medication because forgets to take it in am. Advised to take it later in  the day, when she remembers - TSH - Hemoglobin A1c - levothyroxine (SYNTHROID, LEVOTHROID) 150 MCG tablet; Take 1 tablet (150 mcg total) by mouth daily with breakfast.  Dispense: 90 tablet; Refill: 0  5. Hypertriglyceridemia  - Lipid panel  6. Other fatigue  - COMPLETE METABOLIC PANEL WITH GFR - CBC with Differential/Platelet  7. Mass of right lower leg  Possible lipoma, discussed risk of liposarcoma, it may have been there for a long time and she did not noticed, she will monitor and if growth we will check [place] or refer to surgeon

## 2018-07-11 ENCOUNTER — Ambulatory Visit: Payer: 59

## 2018-07-16 NOTE — Progress Notes (Signed)
Discussed with Dr. Theora Gianotti that patient was recently seen by Dr. Enzo Bi for exam in 06/2018. Based on surveillance guidelines, we will have her rtc around March 2020. She will be 5 years from surgery at that time and possibly be able to be released by gyn-onc clinic for annual follow up with her gyn. Discussed with patient who is agreeable with this plan. Will have scheduling call to reschedule her appt for/around end of March/early April 2020 with Dr. Theora Gianotti.   Patient not seen in clinic on 07/18/2018.

## 2018-07-18 ENCOUNTER — Inpatient Hospital Stay: Payer: BLUE CROSS/BLUE SHIELD | Attending: Obstetrics and Gynecology | Admitting: Obstetrics and Gynecology

## 2018-07-18 ENCOUNTER — Ambulatory Visit: Payer: BLUE CROSS/BLUE SHIELD

## 2018-07-18 ENCOUNTER — Encounter: Payer: Self-pay | Admitting: Nurse Practitioner

## 2018-07-18 ENCOUNTER — Ambulatory Visit: Payer: Self-pay

## 2018-07-18 DIAGNOSIS — Z8542 Personal history of malignant neoplasm of other parts of uterus: Secondary | ICD-10-CM

## 2018-07-18 NOTE — Progress Notes (Signed)
Discussed with Dr. Theora Gianotti that patient was recently seen by Dr. Enzo Bi for exam in 06/2018. Based on surveillance guidelines, we will have her rtc around March 2020. She will be 5 years from surgery at that time and possibly be able to be released by gyn-onc clinic for annual follow up with her gyn. Discussed with patient who is agreeable with this plan. Will have scheduling call to reschedule her appt for/around end of March/early April 2020 with Dr. Theora Gianotti.

## 2018-09-12 ENCOUNTER — Ambulatory Visit: Payer: Self-pay | Admitting: Family Medicine

## 2018-10-03 ENCOUNTER — Ambulatory Visit: Payer: BLUE CROSS/BLUE SHIELD

## 2018-11-16 ENCOUNTER — Encounter: Payer: Self-pay | Admitting: Family Medicine

## 2018-11-16 ENCOUNTER — Other Ambulatory Visit: Payer: Self-pay

## 2018-11-16 ENCOUNTER — Ambulatory Visit (INDEPENDENT_AMBULATORY_CARE_PROVIDER_SITE_OTHER): Payer: Self-pay | Admitting: Family Medicine

## 2018-11-16 VITALS — BP 118/76 | HR 68 | Temp 98.3°F | Resp 16 | Ht 63.0 in | Wt 300.8 lb

## 2018-11-16 DIAGNOSIS — R0602 Shortness of breath: Secondary | ICD-10-CM

## 2018-11-16 DIAGNOSIS — Z9884 Bariatric surgery status: Secondary | ICD-10-CM

## 2018-11-16 DIAGNOSIS — E039 Hypothyroidism, unspecified: Secondary | ICD-10-CM

## 2018-11-16 DIAGNOSIS — C55 Malignant neoplasm of uterus, part unspecified: Secondary | ICD-10-CM | POA: Insufficient documentation

## 2018-11-16 DIAGNOSIS — Z6841 Body Mass Index (BMI) 40.0 and over, adult: Secondary | ICD-10-CM

## 2018-11-16 DIAGNOSIS — R600 Localized edema: Secondary | ICD-10-CM

## 2018-11-16 MED ORDER — ALBUTEROL SULFATE HFA 108 (90 BASE) MCG/ACT IN AERS: 2.0000 | INHALATION_SPRAY | Freq: Four times a day (QID) | RESPIRATORY_TRACT | 0 refills | Status: DC | PRN

## 2018-11-16 NOTE — Progress Notes (Signed)
Name: Dana Whitaker   MRN: [id number]    DOB: [birth year]   Date:11/16/2018       Progress Note  Subjective  Chief Complaint  Chief Complaint  Patient presents with  . Shortness of Breath    cannot walk a distance  . Obesity    weight gain is worse even after Gastric Sleeve. Gained 70 lbs in a year. Retaining fluid  . Hypothyroidism    not taking medication as prescribed    HPI  Pt presents with the following concerns:  Hypothyroidism:  She has hypothyroidism, having fatigue.  She has history of non-compliance with her medications - she forgets to take it often. Denies constipation/diarrhea, hair/skin/changes, no palpitations.   Shortness of breath/BLE edema:  She has been experiencing shortness of breath for about 2 months, has had BLE edema for many years but worse over the last several months.  She has also noticed she has been gaining weight.  Over December 2019, she was doing Zumba regularly, and now she is having trouble getting deep breaths, or having take a deep breath between words.  She is former smoker - quit November 2017 - has 20 pack year history.  She denies chest pain, palpitations.   Obesity: She had gastric sleeve surgery, lost weight initially, but started gaining back slowly.    Patient Active Problem List   Diagnosis Date Noted  . Vaginal dysplasia 01/09/2018  . LGSIL Pap smear of vagina 01/02/2018  . Vaginal high risk HPV DNA test positive 01/02/2018  . Hypothyroidism 11/28/2017  . Urgency of urination 11/10/2016  . Surgical menopause 11/10/2016  . Cyst of left breast 08/30/2016  . History of malignant neoplasm of endometrium 06/29/2016  . 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 Fatty liver 06/13/2016  . Bilateral carpal tunnel syndrome 06/13/2016  . Status post bariatric surgery 06/13/2016  . Hypertriglyceridemia 05/19/2016  . History of fracture of left ankle 05/19/2016  . Former cigarette smoker 02/17/2016  . Prediabetes 02/17/2016  . Status post  laparoscopic hysterectomy 11/10/2015  . Morbid obesity with BMI of 50.0-59.9, adult ([place]) 11/10/2015  . Borderline personality disorder ([place]) 01/13/2015  . ADHD (attention deficit hyperactivity disorder), combined type 01/13/2015  . Apnea, sleep 01/13/2015  . Insomnia, persistent 01/13/2015  . Depression, major, recurrent, moderate ([place]) 01/13/2015  . Fibromyalgia 01/13/2015  . Classical migraine with intractable migraine 09/12/2014  . 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 Smoking status: Former Smoker    Packs/day: 1.00    Years: 23.00    Pack years: 23.00    Types: Cigarettes    Start date: 02/05/1994    Last attempt to quit: 05/10/2016    Years since quitting: 2.5  . Smokeless tobacco: Never Used  Substance Use Topics  . Alcohol use: Yes    Alcohol/week: 0.0 standard drinks    Comment: occas     Current Outpatient Medications:  .  levothyroxine (SYNTHROID, LEVOTHROID) 150 MCG tablet, Take 1 tablet (150 mcg total) by mouth daily with breakfast., Disp: 90 tablet, Rfl: 0  Allergies  Allergen Reactions  . Ciprofloxacin Hives  . Tramadol Rash    I personally reviewed active problem list, medication list, allergies, notes from last encounter, lab results with the patient/caregiver today.  ROS  Ten systems reviewed and is negative except as mentioned in HPI Objective  Vitals:   11/16/18 0849  BP: 118/76  Pulse: 68  Resp: 16  Temp: 98.3 F (36.8 C)  TempSrc: Oral  SpO2: 97%  Weight: (!) 300 lb 12.8 oz (136.4 kg)  Height: 5\' 3"  (1.6 m)   Body mass index is 53.28 kg/m.  Nursing Note and Vital Signs reviewed.  Physical Exam  Constitutional: Patient appears well-developed and well-nourished. No distress.  HENT: Head: Normocephalic and atraumatic. Eyes: Conjunctivae and EOM are normal. No scleral icterus. Neck: Normal range of motion. Neck supple. No JVD present. No thyromegaly present.  Cardiovascular: Normal rate,  regular rhythm and normal heart sounds.  No murmur heard. No BLE edema. Pulmonary/Chest: Effort normal and breath sounds normal. No respiratory distress. Musculoskeletal: Normal range of motion, no joint effusions. No gross deformities Neurological: Pt is alert and oriented to person, place, and time. No cranial nerve deficit. Coordination, balance, strength, speech and gait are normal.  Skin: Skin is warm and dry. No rash noted. No erythema.  Psychiatric: Patient has a normal mood and affect. behavior is normal. Judgment and thought content normal.  No results found for this or any previous visit (from the past 72 hour(s)).  Assessment & Plan  1. Shortness of breath - Labs per orders from Dr. Ancil Boozer will be checked today. - Brain natriuretic peptide - We will send in albuterol inhaler as we are not able to perform spirometry in house due to [place] pandemic.  If albuterol seems to be helpful, we will consider mild to moderate COPD as possible contributor factor.  Also consider deconditioning and morbid obesity as possible etiology.   2. Morbid obesity with BMI of 50.0-59.9, adult ([place]) - Discussed importance of 150 minutes of physical activity weekly, eat two servings of fish weekly, eat one serving of tree nuts ( cashews, pistachios, pecans, almonds.Marland Kitchen) every other day, eat 6 servings of fruit/vegetables daily and drink plenty of water and avoid sweet beverages.  - Labs per orders.  3. Hypothyroidism, unspecified type - Labs per orders.; discussed compliance with medication at length.  4. Status post bariatric surgery - Labs per orders from Dr. Ancil Boozer  5. Lower extremity edema - Brain natriuretic peptide   -Red flags and when to present for emergency care or RTC including fever >101.58F, chest pain, shortness of breath, new/worsening/un-resolving symptoms, reviewed with patient at time of visit. Follow up and care instructions discussed and provided in AVS.

## 2018-11-17 LAB — LIPID PANEL
Cholesterol: 191 mg/dL (ref <200)
HDL: 79 mg/dL (ref >=50)
LDL Cholesterol (Calc): 96 mg/dL (calc)
Non-HDL Cholesterol (Calc): 112 mg/dL (calc) (ref <130)
Total CHOL/HDL Ratio: 2.4 (calc) (ref <5.0)
Triglycerides: 72 mg/dL (ref <150)

## 2018-11-17 LAB — BRAIN NATRIURETIC PEPTIDE: Brain Natriuretic Peptide: 23 pg/mL (ref <100)

## 2018-11-17 LAB — CBC WITH DIFFERENTIAL/PLATELET
Absolute Monocytes: 504 cells/uL (ref 200–950)
Basophils Absolute: 32 cells/uL (ref 0–200)
Basophils Relative: 0.5 %
Eosinophils Absolute: 107 cells/uL (ref 15–500)
Eosinophils Relative: 1.7 %
HCT: 39.9 % (ref 35.0–45.0)
Hemoglobin: 13.4 g/dL (ref 11.7–15.5)
Lymphs Abs: 1846 cells/uL (ref 850–3900)
MCH: 30.2 pg (ref 27.0–33.0)
MCHC: 33.6 g/dL (ref 32.0–36.0)
MCV: 89.9 fL (ref 80.0–100.0)
MPV: 10.1 fL (ref 7.5–12.5)
Monocytes Relative: 8 %
Neutro Abs: 3812 cells/uL (ref 1500–7800)
Neutrophils Relative %: 60.5 %
Platelets: 266 10*3/uL (ref 140–400)
RBC: 4.44 10*6/uL (ref 3.80–5.10)
RDW: 14.3 % (ref 11.0–15.0)
Total Lymphocyte: 29.3 %
WBC: 6.3 10*3/uL (ref 3.8–10.8)

## 2018-11-17 LAB — COMPLETE METABOLIC PANEL WITH GFR
AG Ratio: 1.4 (calc) (ref 1.0–2.5)
ALT: 22 U/L (ref 6–29)
AST: 18 U/L (ref 10–30)
Albumin: 4.1 g/dL (ref 3.6–5.1)
Alkaline phosphatase (APISO): 67 U/L (ref 31–125)
BUN: 13 mg/dL (ref 7–25)
CO2: 27 mmol/L (ref 20–32)
Calcium: 9.4 mg/dL (ref 8.6–10.2)
Chloride: 104 mmol/L (ref 98–110)
Creat: 0.75 mg/dL (ref 0.50–1.10)
GFR, Est African American: 115 mL/min/{1.73_m2} (ref >=60)
GFR, Est Non African American: 99 mL/min/{1.73_m2} (ref >=60)
Globulin: 2.9 g/dL (calc) (ref 1.9–3.7)
Glucose, Bld: 88 mg/dL (ref 65–99)
Potassium: 4.1 mmol/L (ref 3.5–5.3)
Sodium: 140 mmol/L (ref 135–146)
Total Bilirubin: 0.4 mg/dL (ref 0.2–1.2)
Total Protein: 7 g/dL (ref 6.1–8.1)

## 2018-11-17 LAB — HEMOGLOBIN A1C
Hgb A1c MFr Bld: 6 % of total Hgb — ABNORMAL HIGH (ref <5.7)
Mean Plasma Glucose: 126 (calc)
eAG (mmol/L): 7 (calc)

## 2018-11-17 LAB — TSH: TSH: 6.21 mIU/L — ABNORMAL HIGH

## 2018-12-03 ENCOUNTER — Telehealth: Payer: Self-pay

## 2018-12-03 NOTE — Telephone Encounter (Signed)
Pt prescreened no symptoms, pt aware of no visitors policy, pt has face mask, pt is aware to stay in her car until someone calls her.   Coronavirus (COVID-19) Are you at risk?  Are you at risk for the Coronavirus (COVID-19)?  To be considered HIGH RISK for Coronavirus (COVID-19), you have to meet the following criteria:  . Traveled to [place], [place], [place], [place] or [place]; or in the [place] to [place], [place], [place], or [place]; and have fever, cough, and shortness of breath within the last 2 weeks of travel OR . Been in close contact with a person diagnosed with COVID-19 within the last 2 weeks and have fever, cough, and shortness of breath . IF YOU DO NOT MEET THESE CRITERIA, YOU ARE CONSIDERED LOW RISK FOR COVID-19.  What to do if you are HIGH RISK for COVID-19?  Marland Kitchen If you are having a medical emergency, call 911. . Seek medical care right away. Before you go to a doctor's office, urgent care or emergency department, call ahead and tell them about your recent travel, contact with someone diagnosed with COVID-19, and your symptoms. You should receive instructions from your physician's office regarding next steps of care.  . When you arrive at healthcare provider, tell the healthcare staff immediately you have returned from visiting [place], [place], [place], [place] or [place]; or traveled in the [place] to [place], [place], [place], or [place]; in the last two weeks or you have been in close contact with a person diagnosed with COVID-19 in the last 2 weeks.   . Tell the health care staff about your symptoms: fever, cough and shortness of breath. . After you have been seen by a medical provider, you will be either: o Tested for (COVID-19) and discharged home on quarantine except to seek medical care if symptoms worsen, and asked to  - Stay home and avoid contact with others until you get your results (4-5 days)  - Avoid travel on public transportation if  possible (such as bus, train, or airplane) or o Sent to the Emergency Department by EMS for evaluation, COVID-19 testing, and possible admission depending on your condition and test results.  What to do if you are LOW RISK for COVID-19?  Reduce your risk of any infection by using the same precautions used for avoiding the common cold or flu:  Marland Kitchen Wash your hands often with soap and warm water for at least 20 seconds.  If soap and water are not readily available, use an alcohol-based hand sanitizer with at least 60% alcohol.  . If coughing or sneezing, cover your mouth and nose by coughing or sneezing into the elbow areas of your shirt or coat, into a tissue or into your sleeve (not your hands). . Avoid shaking hands with others and consider head nods or verbal greetings only. . Avoid touching your eyes, nose, or mouth with unwashed hands.  . Avoid close contact with people who are sick. . Avoid places or events with large numbers of people in one location, like concerts or sporting events. . Carefully consider travel plans you have or are making. . If you are planning any travel outside or inside the [place], visit the CDC's Travelers' Health webpage for the latest health notices. . If you have some symptoms but not all symptoms, continue to monitor at home and seek medical attention if your symptoms worsen. . If you are having a medical emergency, call 911.  [place] / e-Visit: [email or web address]         [facility]: [facility]: [phone]                   [facility]: [phone]

## 2018-12-04 ENCOUNTER — Encounter: Payer: Self-pay | Admitting: Obstetrics and Gynecology

## 2018-12-04 ENCOUNTER — Other Ambulatory Visit: Payer: Self-pay

## 2018-12-04 ENCOUNTER — Other Ambulatory Visit (HOSPITAL_COMMUNITY)
Admission: RE | Admit: 2018-12-04 | Discharge: 2018-12-04 | Disposition: A | Discharge status: Home / Self Care | Payer: Self-pay | Source: Ambulatory Visit | Attending: Obstetrics and Gynecology | Admitting: Obstetrics and Gynecology

## 2018-12-04 ENCOUNTER — Ambulatory Visit: Payer: Self-pay | Admitting: Obstetrics and Gynecology

## 2018-12-04 VITALS — BP 108/75 | HR 69 | Ht 64.0 in | Wt 299.8 lb

## 2018-12-04 DIAGNOSIS — N890 Mild vaginal dysplasia: Secondary | ICD-10-CM

## 2018-12-04 DIAGNOSIS — Z124 Encounter for screening for malignant neoplasm of cervix: Secondary | ICD-10-CM | POA: Insufficient documentation

## 2018-12-04 DIAGNOSIS — Z1231 Encounter for screening mammogram for malignant neoplasm of breast: Secondary | ICD-10-CM

## 2018-12-04 DIAGNOSIS — Z01419 Encounter for gynecological examination (general) (routine) without abnormal findings: Secondary | ICD-10-CM

## 2018-12-04 DIAGNOSIS — Z8542 Personal history of malignant neoplasm of other parts of uterus: Secondary | ICD-10-CM

## 2018-12-04 NOTE — Progress Notes (Signed)
HPI:      [name] is a 43 y.o. G1P1001 who LMP was Patient's last menstrual period was 08/04/2013.  Subjective:   She presents today for her annual examination.  She has no complaints today.  She has a history of endometrial cancer and has just passed her 5-year anniversary.  She is followed by Dr. Theora Gianotti and her visit has been delayed by COVID-19.  She has obviously had a previous hysterectomy. 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    Hx: The following portions of the patient's history were reviewed and updated as appropriate:             She  has a past medical history of ADHD (attention deficit hyperactivity disorder), Anxiety, Bipolar disorder ([place]), Breast pain, Depression, Eczema, Falls, Fatigue, Fibromyalgia, GERD (gastroesophageal reflux disease), Headache, History of bronchitis, History of sprain of both ankles, Hyperlipemia, Obesity, Postcoital bleeding, Pre-diabetes, Sleep apnea, Uterine cancer ([place]), Vaginal dysplasia, and Varicose veins of lower extremity. She does not have any pertinent problems on file. She  has a past surgical history that includes Tonsillectomy; Abdominal hysterectomy; LEEP; Carpal tunnel release; Varicose vein surgery; and Laparoscopic gastric sleeve resection (N/A, 06/13/2016). Her family history includes ADD / ADHD in her sister; Alcohol abuse in her sister; Anxiety disorder in her sister and sister; Bipolar disorder in her mother; Depression in her sister and sister; Diabetes in her maternal grandmother; Drug abuse in her father; Heart attack in her father; Hyperlipidemia in her mother; Stroke in her maternal grandmother; Thyroid disease in her maternal grandmother. She  reports that she quit smoking about 2 years ago. Her smoking use included cigarettes. She started smoking about 24 years ago. She has a 23.00 pack-year smoking history. She has never used smokeless tobacco. She reports current alcohol  use. She reports that she does not use drugs. She has a current medication list which includes the following prescription(s): albuterol and levothyroxine. She is allergic to ciprofloxacin and tramadol.       Review of Systems:  Review of Systems  Constitutional: Denied constitutional symptoms, night sweats, recent illness, fatigue, fever, insomnia and weight loss.  Eyes: Denied eye symptoms, eye pain, photophobia, vision change and visual disturbance.  Ears/Nose/Throat/Neck: Denied ear, nose, throat or neck symptoms, hearing loss, nasal discharge, sinus congestion and sore throat.  Cardiovascular: Denied cardiovascular symptoms, arrhythmia, chest pain/pressure, edema, exercise intolerance, orthopnea and palpitations.  Respiratory: Denied pulmonary symptoms, asthma, pleuritic pain, productive sputum, cough, dyspnea and wheezing.  Gastrointestinal: Denied, gastro-esophageal reflux, melena, nausea and vomiting.  Genitourinary: Denied genitourinary symptoms including symptomatic vaginal discharge, pelvic relaxation issues, and urinary complaints.  Musculoskeletal: Denied musculoskeletal symptoms, stiffness, swelling, muscle weakness and myalgia.  Dermatologic: Denied dermatology symptoms, rash and scar.  Neurologic: Denied neurology symptoms, dizziness, headache, neck pain and syncope.  Psychiatric: Denied psychiatric symptoms, anxiety and depression.  Endocrine: Denied endocrine symptoms including hot flashes and night sweats.   Meds:   Current Outpatient Medications on File Prior to Visit  Medication Sig Dispense Refill  . albuterol (VENTOLIN HFA) 108 (90 Base) MCG/ACT inhaler Inhale 2 puffs into the lungs every 6 (six) hours as needed for wheezing or shortness of breath. 1 Inhaler 0  . levothyroxine (SYNTHROID, LEVOTHROID) 150 MCG tablet Take 1 tablet (150 mcg total) by mouth daily with breakfast. 90 tablet 0   No current facility-administered medications on file prior to visit.      Objective:     Vitals:  12/04/18 0838  BP: 108/75  Pulse: 69      Physical examination General NAD, Conversant  HEENT Atraumatic; Op clear with mmm.  Normo-cephalic. Pupils reactive. Anicteric sclerae  Thyroid/Neck Smooth without nodularity or enlargement. Normal ROM.  Neck Supple.  Skin No rashes, lesions or ulceration. Normal palpated skin turgor. No nodularity.  Breasts: No masses or discharge.  Symmetric.  No axillary adenopathy.  Lungs: Clear to auscultation.No rales or wheezes. Normal Respiratory effort, no retractions.  Heart: NSR.  No murmurs or rubs appreciated. No periferal edema  Abdomen: Soft.  Non-tender.  No masses.  No HSM. No hernia  Extremities: Moves all appropriately.  Normal ROM for age. No lymphadenopathy.  Neuro: Oriented to PPT.  Normal mood. Normal affect.     Pelvic:   Vulva: Normal appearance.  No lesions.   Vagina: No lesions or abnormalities noted.  Support: Normal pelvic support.  Urethra No masses tenderness or scarring.  Meatus Normal size without lesions or prolapse.  Cervix: Surgically absent   Anus: Normal exam.  No lesions.  Perineum: Normal exam.  No lesions.        Bimanual   Uterus: Surgically absent   Adnexae: No masses.  Non-tender to palpation.  Cul-de-sac: Negative for abnormality.   Examination limited by patient body habitus.    Assessment:    G1P1001 Patient Active Problem List   Diagnosis Date Noted  . 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 Vaginal dysplasia 01/09/2018  . LGSIL Pap smear of vagina 01/02/2018  . Vaginal high risk HPV DNA test positive 01/02/2018  . Hypothyroidism 11/28/2017  . Urgency of urination 11/10/2016  . Surgical menopause 11/10/2016  . Cyst of left breast 08/30/2016  . History of malignant neoplasm of endometrium 06/29/2016  . 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 Fatty liver 06/13/2016  . Bilateral carpal tunnel syndrome 06/13/2016  . Status post bariatric surgery  06/13/2016  . Hypertriglyceridemia 05/19/2016  . History of fracture of left ankle 05/19/2016  . Former cigarette smoker 02/17/2016  . Prediabetes 02/17/2016  . Status post laparoscopic hysterectomy 11/10/2015  . Morbid obesity with BMI of 50.0-59.9, adult ([place]) 11/10/2015  . Borderline personality disorder ([place]) 01/13/2015  . ADHD (attention deficit hyperactivity disorder), combined type 01/13/2015  . Apnea, sleep 01/13/2015  . Insomnia, persistent 01/13/2015  . Depression, major, recurrent, moderate ([place]) 01/13/2015  . Fibromyalgia 01/13/2015  . Classical migraine with intractable migraine 09/12/2014  . 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 Well woman exam   2. Encounter for screening mammogram for breast cancer   3. History of endometrial cancer   4. Vaginal intraepithelial neoplasia grade 1        Plan:            1.  Pap of the vaginal cuff performed because of the history of VAIN I  2.  Mammogram scheduled for July Orders Orders Placed This Encounter  Procedures  . MM 3D SCREEN BREAST BILATERAL    No orders of the defined types were placed in this encounter.       F/U  Return in about 13 months (around 01/03/2020) for Annual Physical.  Finis Bud, M.D. 12/04/2018 9:32 AM

## 2018-12-04 NOTE — Addendum Note (Signed)
Addended by: Durwin Glaze on: 12/04/2018 09:46 AM   Modules accepted: Orders

## 2018-12-04 NOTE — Progress Notes (Signed)
Patient comes in today for annual exam. She has no concerns today.

## 2018-12-07 LAB — CYTOLOGY - PAP: Diagnosis: NEGATIVE

## 2018-12-26 ENCOUNTER — Ambulatory Visit (INDEPENDENT_AMBULATORY_CARE_PROVIDER_SITE_OTHER): Payer: Self-pay | Admitting: Family Medicine

## 2018-12-26 ENCOUNTER — Other Ambulatory Visit: Payer: Self-pay

## 2018-12-26 ENCOUNTER — Encounter: Payer: Self-pay | Admitting: Family Medicine

## 2018-12-26 VITALS — BP 120/70 | HR 84 | Temp 96.9°F | Resp 16 | Ht 63.0 in | Wt 278.6 lb

## 2018-12-26 DIAGNOSIS — E039 Hypothyroidism, unspecified: Secondary | ICD-10-CM

## 2018-12-26 DIAGNOSIS — F320 Major depressive disorder, single episode, mild: Secondary | ICD-10-CM

## 2018-12-26 DIAGNOSIS — R21 Rash and other nonspecific skin eruption: Secondary | ICD-10-CM

## 2018-12-26 DIAGNOSIS — Z9884 Bariatric surgery status: Secondary | ICD-10-CM

## 2018-12-26 DIAGNOSIS — Z6841 Body Mass Index (BMI) 40.0 and over, adult: Secondary | ICD-10-CM

## 2018-12-26 MED ORDER — AMMONIUM LACTATE 12 % EX LOTN: 1.0000 "application " | TOPICAL_LOTION | CUTANEOUS | 0 refills | Status: DC | PRN

## 2018-12-26 NOTE — Patient Instructions (Signed)
First two weeks eat no more than 20 g of carbohydrate After that try to stay at 30 g but no more than 50 g Also advised to drink at least 70 oz of fluids daily  Monitor for cramps

## 2018-12-26 NOTE — Progress Notes (Signed)
Name: Dana Whitaker   MRN: [id number]    DOB: 11-02-1977   Date:12/26/2018       Progress Note  Subjective  Chief Complaint  Chief Complaint  Patient presents with  . Migraine  . Depression  . ADHD    HPI  Obesity: she has a long history of morbid obesity. Maximum of 298 lbs Dec 2017 prior to gastric sleeve surgery, she lost down to 238 lbs June 2018 but gradually gained back. She tried Vyvanse for binge eating but kept her awake at night, she states she has not been binge eating lately but having poor food choices. She has pre-diabetes last A1C was 6%, discussed carbohydrate restrictive diet  Hypothyroidism: last TSH was elevated, she states since last visit she has been taking medication every day. She has gained weight.    under the care of Dr. Amalia Hailey, had a CPE May 2020, had pap smear of vaginal cuff secondary to history of VAIN I  Rash: chronic rash on right foot, not severe right now, at times itchy  Patient Active Problem List   Diagnosis Date Noted  . 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 Vaginal dysplasia 01/09/2018  . LGSIL Pap smear of vagina 01/02/2018  . Vaginal high risk HPV DNA test positive 01/02/2018  . Hypothyroidism 11/28/2017  . Urgency of urination 11/10/2016  . Surgical menopause 11/10/2016  . Cyst of left breast 08/30/2016  . History of malignant neoplasm of endometrium 06/29/2016  . 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 Fatty liver 06/13/2016  . Bilateral carpal tunnel syndrome 06/13/2016  . Status post bariatric surgery 06/13/2016  . Hypertriglyceridemia 05/19/2016  . History of fracture of left ankle 05/19/2016  . Former cigarette smoker 02/17/2016  . Prediabetes 02/17/2016  . Status post laparoscopic hysterectomy 11/10/2015  . Morbid obesity with BMI of 50.0-59.9, adult ([place]) 11/10/2015  . Borderline personality disorder ([place]) 01/13/2015  . ADHD (attention deficit hyperactivity disorder), combined type 01/13/2015  . Apnea,  sleep 01/13/2015  . Insomnia, persistent 01/13/2015  . Depression, major, recurrent, moderate ([place]) 01/13/2015  . Fibromyalgia 01/13/2015  . Classical migraine with intractable migraine 09/12/2014  . 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 ABDOMINAL HYSTERECTOMY     [place].bso  . CARPAL TUNNEL RELEASE     pt states did not have surgery just injections   . LAPAROSCOPIC GASTRIC SLEEVE RESECTION N/A 06/13/2016   Procedure: LAPAROSCOPIC GASTRIC SLEEVE RESECTION WITH HIATAL HERNIA REPAIR AND UPPER ENDOSCOPY;  Surgeon: Greer Pickerel, MD;  Location: [facility];  Service: General;  Laterality: N/A;  . LEEP    . TONSILLECTOMY    . VARICOSE VEIN SURGERY      Family History  Problem Relation Age of Onset  . Hyperlipidemia Mother   . Bipolar disorder Mother   . Heart attack Father   . Drug abuse Father   . Anxiety disorder Sister   . Depression Sister   . Anxiety disorder Sister   . Depression Sister   . ADD / ADHD Sister   . Alcohol abuse Sister   . Thyroid disease Maternal Grandmother   . Diabetes Maternal Grandmother   . Stroke Maternal Grandmother   . Ovarian cancer Neg Hx   . Breast cancer Neg Hx   . Colon cancer Neg Hx     Social History   Socioeconomic History  . Marital status: Married    Spouse name: Not on file  . Number of children:  1  . Years of education: Not on file  . Highest education level: Associate degree: academic program  Occupational History  . Occupation: Optometrist  Social Needs  . Financial resource strain: Not on file  . Food insecurity    Worry: Not on file    Inability: Not on file  . Transportation needs    Medical: Not on file    Non-medical: Not on file  Tobacco Use  . Smoking status: Former Smoker    Packs/day: 1.00    Years: 23.00    Pack years: 23.00    Types: Cigarettes    Start date: 02/05/1994    Quit date: 05/10/2016    Years since quitting: 2.6  . Smokeless tobacco: Never  Used  Substance and Sexual Activity  . Alcohol use: Yes    Alcohol/week: 0.0 standard drinks    Comment: occas  . Drug use: No  . Sexual activity: Yes    Birth control/protection: Surgical  Lifestyle  . Physical activity    Days per week: 0 days    Minutes per session: 0 min  . Stress: Not on file  Relationships  . Social connections    Talks on phone: More than three times a week    Gets together: Once a week    Attends religious service: More than 4 times per year    Active member of club or organization: Yes    Attends meetings of clubs or organizations: More than 4 times per year    Relationship status: Married  . Intimate partner violence    Fear of current or ex partner: No    Emotionally abused: No    Physically abused: No    Forced sexual activity: No  Other Topics Concern  . Not on file  Social History Narrative   Married,    Has a grown son, raising a 3 yo step-daughter, also fostering a baby girl    Not in a good marital situation, but improving      Current Outpatient Medications:  .  albuterol (VENTOLIN HFA) 108 (90 Base) MCG/ACT inhaler, Inhale 2 puffs into the lungs every 6 (six) hours as needed for wheezing or shortness of breath., Disp: 1 Inhaler, Rfl: 0 .  levothyroxine (SYNTHROID, LEVOTHROID) 150 MCG tablet, Take 1 tablet (150 mcg total) by mouth daily with breakfast., Disp: 90 tablet, Rfl: 0  Allergies  Allergen Reactions  . Ciprofloxacin Hives  . Tramadol Rash    I personally reviewed active problem list, medication list, allergies, family history, social history with the patient/caregiver today.   ROS  Constitutional: Negative for fever or weight change.  Respiratory: Negative for cough and shortness of breath.   Cardiovascular: Negative for chest pain or palpitations.  Gastrointestinal: Negative for abdominal pain, no bowel changes.  Musculoskeletal: Negative for gait problem or joint swelling.  Skin: Negative for rash.  Neurological:  Negative for dizziness or headache.  No other specific complaints in a complete review of systems (except as listed in HPI above).  Objective  Vitals:   12/26/18 0800  BP: 120/70  Pulse: 82  Resp: 16  Temp: 98.2 F (36.8 C)  TempSrc: Oral  SpO2: 96%  Weight: 299 lb 8 oz (135.9 kg)  Height: 5\' 4"  (1.626 m)    Body mass index is 51.41 kg/m.  Physical Exam  Constitutional: Patient appears well-developed and well-nourished. Obese  No distress.  HEENT: head atraumatic, normocephalic, pupils equal and reactive to light,  neck supple, oral mucosa not  done  Cardiovascular: Normal rate, regular rhythm and normal heart sounds.  No murmur heard. No BLE edema. Pulmonary/Chest: Effort normal and breath sounds normal. No respiratory distress. Abdominal: Soft.  There is no tenderness. Psychiatric: Patient has a normal mood and affect. behavior is normal. Judgment and thought content normal.   Recent Results (from the past 2160 hour(s))  Brain natriuretic peptide     Status: None   Collection Time: 11/16/18  9:43 AM  Result Value Ref Range   Brain Natriuretic Peptide 23 <100 pg/mL    Comment: . BNP levels increase with age in the general population with the highest values seen in individuals greater than 65 years of age. Reference: J. Am. [place] Ar. Cardiol. 2002; 40:981-191. .   Lipid panel     Status: None   Collection Time: 11/16/18  9:43 AM  Result Value Ref Range   Cholesterol 191 <200 mg/dL   HDL 79 > OR = 50 mg/dL   Triglycerides 72 <150 mg/dL   LDL Cholesterol (Calc) 96 mg/dL (calc)    Comment: Reference range: <100 . Desirable range <100 mg/dL for primary prevention;   <70 mg/dL for patients with CHD or diabetic patients  with > or = 2 CHD risk factors. Marland Kitchen LDL-C is now calculated using the Martin-Hopkins  calculation, which is a validated novel method providing  better accuracy than the Friedewald equation in the  estimation of LDL-C.  Cresenciano Genre et al. Annamaria Helling.  4782;956(21): 2061-2068  (http://education.[employer].com/faq/FAQ164)    Total CHOL/HDL Ratio 2.4 <5.0 (calc)   Non-HDL Cholesterol (Calc) 112 <130 mg/dL (calc)    Comment: For patients with diabetes plus 1 major ASCVD risk  factor, treating to a non-HDL-C goal of <100 mg/dL  (LDL-C of <70 mg/dL) is considered a therapeutic  option.   COMPLETE METABOLIC PANEL WITH GFR     Status: None   Collection Time: 11/16/18  9:43 AM  Result Value Ref Range   Glucose, Bld 88 65 - 99 mg/dL    Comment: .            Fasting reference interval .    BUN 13 7 - 25 mg/dL   Creat 0.75 0.50 - 1.10 mg/dL   GFR, Est Non African American 99 > OR = 60 mL/min/1.77m2   GFR, Est African American 115 > OR = 60 mL/min/1.57m2   BUN/Creatinine Ratio NOT APPLICABLE 6 - 22 (calc)   Sodium 140 135 - 146 mmol/L   Potassium 4.1 3.5 - 5.3 mmol/L   Chloride 104 98 - 110 mmol/L   CO2 27 20 - 32 mmol/L   Calcium 9.4 8.6 - 10.2 mg/dL   Total Protein 7.0 6.1 - 8.1 g/dL   Albumin 4.1 3.6 - 5.1 g/dL   Globulin 2.9 1.9 - 3.7 g/dL (calc)   AG Ratio 1.4 1.0 - 2.5 (calc)   Total Bilirubin 0.4 0.2 - 1.2 mg/dL   Alkaline phosphatase (APISO) 67 31 - 125 U/L   AST 18 10 - 30 U/L   ALT 22 6 - 29 U/L  Hemoglobin A1c     Status: Abnormal   Collection Time: 11/16/18  9:43 AM  Result Value Ref Range   Hgb A1c MFr Bld 6.0 (H) <5.7 % of total Hgb    Comment: For someone without known diabetes, a hemoglobin  A1c value between 5.7% and 6.4% is consistent with prediabetes and should be confirmed with a  follow-up test. . For someone with known diabetes, a value <7% indicates that  their diabetes is well controlled. A1c targets should be individualized based on duration of diabetes, age, comorbid conditions, and other considerations. . This assay result is consistent with an increased risk of diabetes. . Currently, no consensus exists regarding use of hemoglobin A1c for diagnosis of diabetes for children. .    Mean  Plasma Glucose 126 (calc)   eAG (mmol/L) 7.0 (calc)  TSH     Status: Abnormal   Collection Time: 11/16/18  9:43 AM  Result Value Ref Range   TSH 6.21 (H) mIU/L    Comment:           Reference Range .           > or = 20 Years  0.40-4.50 .                Pregnancy Ranges           First trimester    0.26-2.66           Second trimester   0.55-2.73           Third trimester    0.43-2.91   CBC with Differential/Platelet     Status: None   Collection Time: 11/16/18  9:43 AM  Result Value Ref Range   WBC 6.3 3.8 - 10.8 Thousand/uL   RBC 4.44 3.80 - 5.10 Million/uL   Hemoglobin 13.4 11.7 - 15.5 g/dL   HCT 39.9 35.0 - 45.0 %   MCV 89.9 80.0 - 100.0 fL   MCH 30.2 27.0 - 33.0 pg   MCHC 33.6 32.0 - 36.0 g/dL   RDW 14.3 11.0 - 15.0 %   Platelets 266 140 - 400 Thousand/uL   MPV 10.1 7.5 - 12.5 fL   Neutro Abs 3,812 1,500 - 7,800 cells/uL   Lymphs Abs 1,846 850 - 3,900 cells/uL   Absolute Monocytes 504 200 - 950 cells/uL   Eosinophils Absolute 107 15 - 500 cells/uL   Basophils Absolute 32 0 - 200 cells/uL   Neutrophils Relative % 60.5 %   Total Lymphocyte 29.3 %   Monocytes Relative 8.0 %   Eosinophils Relative 1.7 %   Basophils Relative 0.5 %  Cytology - PAP     Status: None   Collection Time: 12/04/18 12:00 AM  Result Value Ref Range   Adequacy Satisfactory for evaluation.    Diagnosis      NEGATIVE FOR INTRAEPITHELIAL LESIONS OR MALIGNANCY.   Material Submitted Vaginal Pap [ThinPrep Imaged]    CYTOLOGY - PAP PAP RESULT      PHQ2/9: Depression screen [facility] 2/9 12/26/2018 11/16/2018 06/13/2018 12/12/2017 09/20/2017  Decreased Interest 0 0 0 2 0  Down, Depressed, Hopeless 0 0 0 2 0  PHQ - 2 Score 0 0 0 4 0  Altered sleeping 1 0 0 3 1  Tired, decreased energy 2 0 1 2 1   Change in appetite 2 0 2 2 0  Feeling bad or failure about yourself  1 0 0 2 0  Trouble concentrating 0 0 0 1 0  Moving slowly or fidgety/restless 0 0 0 0 0  Suicidal thoughts 0 0 - 0 0  PHQ-9 Score 6 0 3 14 2    Difficult doing work/chores Not difficult at all Not difficult at all Not difficult at all Somewhat difficult Not difficult at all    phq 9 is negative   Fall Risk: Fall Risk  12/26/2018 11/16/2018 06/13/2018 12/12/2017 09/20/2017  Falls in the past year? 0 1 0 No Yes  Number  falls in past yr: 0 0 - - 2 or more  Injury with Fall? 0 1 - - Yes  Comment - - - - -  Risk Factor Category  - - - - High Fall Risk  Risk for fall due to : - History of fall(s) - - -  Risk for fall due to: Comment - - - - -  Follow up - Falls evaluation completed - - -    Functional Status Survey: Is the patient deaf or have difficulty hearing?: No Does the patient have difficulty seeing, even when wearing glasses/contacts?: No Does the patient have difficulty concentrating, remembering, or making decisions?: No Does the patient have difficulty walking or climbing stairs?: Yes Does the patient have difficulty dressing or bathing?: No Does the patient have difficulty doing errands alone such as visiting a doctor's office or shopping?: No    Assessment & Plan  1. Status post bariatric surgery   2. Mild major depression (HCC)  Discussed medication, but she refuses it   3. Hypothyroidism (acquired)  - TSH Last TSH was high, but she states she is now compliant with medication   4. Rash  - ammonium lactate (AMLACTIN) 12 % lotion; Apply 1 application topically as needed for dry skin.  Dispense: 400 g; Refill: 0  5. Morbid obesity with BMI of 50.0-59.9, adult [facility])  Discussed with the patient the risk posed by an increased BMI. Discussed importance of portion control, calorie counting and at least 150 minutes of physical activity weekly. Avoid sweet beverages and drink more water. Eat at least 6 servings of fruit and vegetables daily

## 2018-12-27 ENCOUNTER — Other Ambulatory Visit: Payer: Self-pay | Admitting: Family Medicine

## 2018-12-27 DIAGNOSIS — E039 Hypothyroidism, unspecified: Secondary | ICD-10-CM

## 2018-12-27 LAB — TSH: TSH: 0.18 mIU/L — ABNORMAL LOW

## 2018-12-27 MED ORDER — LEVOTHYROXINE SODIUM 150 MCG PO TABS: 150.0000 ug | ORAL_TABLET | Freq: Every day | ORAL | 0 refills | Status: DC

## 2019-03-28 ENCOUNTER — Encounter (HOSPITAL_COMMUNITY): Payer: Self-pay

## 2019-04-01 ENCOUNTER — Ambulatory Visit: Payer: Self-pay | Admitting: Family Medicine

## 2019-05-15 ENCOUNTER — Other Ambulatory Visit: Payer: Self-pay | Admitting: Family Medicine

## 2019-05-15 DIAGNOSIS — E039 Hypothyroidism, unspecified: Secondary | ICD-10-CM

## 2019-05-15 NOTE — Telephone Encounter (Signed)
Requested medication (s) are due for refill today: yes  Requested medication (s) are on the active medication list: yes  Last refill:  06/13/2018  Future visit scheduled: no  Notes to clinic:  Overdue for office visit  Review for refill   Requested Prescriptions  Pending Prescriptions Disp Refills   levothyroxine (SYNTHROID) 150 MCG tablet [Pharmacy Med Name: LEVOTHYROXINE SODIUM 150 MCG TAB] 90 tablet 0    Sig: TAKE 1 TABLET EVERY DAY ON EMPTY [place] AT LEAST 30-60 [place]     Endocrinology:  Hypothyroid Agents Failed - 05/15/2019 12:31 PM      Failed - TSH needs to be rechecked within 3 months after an abnormal result. Refill until TSH is due.      Failed - TSH in normal range and within 360 days    TSH  Date Value Ref Range Status  12/26/2018 0.18 (L) mIU/L Final    Comment:              Reference Range .           > or = 20 Years  0.40-4.50 .                Pregnancy Ranges           First trimester    0.26-2.66           Second trimester   0.55-2.73           Third trimester    0.43-2.91          Passed - Valid encounter within last 12 months    Recent Outpatient Visits          4 months ago Mild major depression [facility])   [facility] Steele Sizer, MD   6 months ago Shortness of breath   [place]   11 months ago Mild major depression [facility])   [facility] Steele Sizer, MD   1 year ago Major depressive disorder, recurrent episode, moderate [facility])   [facility] Steele Sizer, MD   1 year ago Major depressive disorder, recurrent episode, moderate [facility])   [facility] Steele Sizer, MD      Future Appointments            In 7 months Amalia Hailey, Nyoka Lint, MD [facility]

## 2019-05-16 ENCOUNTER — Other Ambulatory Visit: Payer: Self-pay | Admitting: Family Medicine

## 2019-05-16 DIAGNOSIS — E039 Hypothyroidism, unspecified: Secondary | ICD-10-CM

## 2019-05-16 DIAGNOSIS — R7303 Prediabetes: Secondary | ICD-10-CM

## 2019-05-16 NOTE — Telephone Encounter (Signed)
Called patient. She is aware. She said she will come by and have labs done. Has an order been placed.

## 2019-05-17 NOTE — Telephone Encounter (Signed)
Pt.notified

## 2019-07-09 LAB — HEMOGLOBIN A1C
Hgb A1c MFr Bld: 5.8 % of total Hgb — ABNORMAL HIGH (ref <5.7)
Mean Plasma Glucose: 120 (calc)
eAG (mmol/L): 6.6 (calc)

## 2019-07-09 LAB — TSH: TSH: 8.56 mIU/L — ABNORMAL HIGH

## 2019-07-10 ENCOUNTER — Other Ambulatory Visit: Payer: Self-pay | Admitting: Family Medicine

## 2019-07-10 DIAGNOSIS — E039 Hypothyroidism, unspecified: Secondary | ICD-10-CM

## 2019-07-10 MED ORDER — LEVOTHYROXINE SODIUM 25 MCG PO TABS: 25.0000 ug | ORAL_TABLET | Freq: Every day | ORAL | 1 refills | Status: DC

## 2019-08-06 ENCOUNTER — Encounter: Payer: Self-pay | Admitting: Family Medicine

## 2019-08-06 ENCOUNTER — Ambulatory Visit: Payer: Self-pay | Admitting: Family Medicine

## 2019-08-06 ENCOUNTER — Other Ambulatory Visit: Payer: Self-pay

## 2019-08-06 VITALS — BP 120/90 | HR 84 | Temp 96.9°F | Resp 16 | Ht 63.0 in | Wt 278.6 lb

## 2019-08-06 DIAGNOSIS — E039 Hypothyroidism, unspecified: Secondary | ICD-10-CM

## 2019-08-06 DIAGNOSIS — Z9884 Bariatric surgery status: Secondary | ICD-10-CM

## 2019-08-06 DIAGNOSIS — R7303 Prediabetes: Secondary | ICD-10-CM

## 2019-08-06 DIAGNOSIS — Z6841 Body Mass Index (BMI) 40.0 and over, adult: Secondary | ICD-10-CM

## 2019-08-06 DIAGNOSIS — F900 Attention-deficit hyperactivity disorder, predominantly inattentive type: Secondary | ICD-10-CM

## 2019-08-06 DIAGNOSIS — F325 Major depressive disorder, single episode, in full remission: Secondary | ICD-10-CM

## 2019-08-06 DIAGNOSIS — Z23 Encounter for immunization: Secondary | ICD-10-CM

## 2019-08-06 MED ORDER — DEXMETHYLPHENIDATE HCL ER 5 MG PO CP24: 5.0000 mg | ORAL_CAPSULE | Freq: Every day | ORAL | 0 refills | Status: DC

## 2019-08-06 NOTE — Progress Notes (Signed)
Name: Dana Whitaker   MRN: [id number]    DOB: 03-02-1976   Date:08/06/2019       Progress Note  Subjective  Chief Complaint  Chief Complaint  Patient presents with  . Medication Refill  . Obesity    Has been doing the Keto Diet and has lost 21 pounds  . Hypothyroidism    HPI  Obesity: she has a long history of morbid obesity. Maximum of 298 lbs Dec 2017 prior to gastric sleeve surgery, she lost down to 238 lbs June 2018 but gradually gained back.She tried Vyvanse for binge eating but kept her awake at night.She has pre-diabetes and A1C is down from 6% to 5.8%. She denies polyphagia, polydipsia or polyuria. She is on a keto diet since last Summer, from 20-50 g of carbs a day, discussed to increase physical activity    Hypothyroidism:last TSH was elevated but she states she was out of her medications, we will continue current dose, try to take it daily and recheck in 6 weeks. She denies dysphagia, no weight gain or change in bowel movements   History of LGSIL and endometrium dysplasia, history of hysterectomy, under the care of Dr. Amalia Hailey, had a CPE May 2020, had pap smear of vaginal cuff secondary to history of VAIN I  Major depression: she has a long history of depression, she states she is doing well emotionally, she is tired but because she works long hours and is switching jobs, but denies lack of desire or feeling down depressed. She has a busy life helping her step-daughter that is 64 yo and also Kendall 15 months that she is helping raise ( used to be foster - now just helping out)   ADHD: son also has ADHD, diagnosed by Dr. Leonides Schanz, she took Ritalin in the past. Currently off medication. 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 Vaginal  dysplasia 01/09/2018  . LGSIL Pap smear of vagina 01/02/2018  . Vaginal high risk HPV DNA test positive 01/02/2018  . Hypothyroidism 11/28/2017  . Urgency of urination 11/10/2016  . Surgical menopause 11/10/2016  . Cyst of left breast 08/30/2016  . History of malignant neoplasm of endometrium 06/29/2016  . 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 Fatty liver 06/13/2016  . Bilateral carpal tunnel syndrome 06/13/2016  . Status post bariatric surgery 06/13/2016  . Hypertriglyceridemia 05/19/2016  . History of fracture of left ankle 05/19/2016  . Former cigarette smoker 02/17/2016  . Prediabetes 02/17/2016  . Status post laparoscopic hysterectomy 11/10/2015  . Morbid obesity with BMI of 50.0-59.9, adult ([place]) 11/10/2015  . Borderline personality disorder ([place]) 01/13/2015  . ADHD (attention deficit hyperactivity disorder), combined type 01/13/2015  . Apnea, sleep 01/13/2015  . Insomnia, persistent 01/13/2015  . Depression, major, recurrent, moderate ([place]) 01/13/2015  . Fibromyalgia 01/13/2015  . Classical migraine with intractable migraine 09/12/2014  . 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 ABDOMINAL HYSTERECTOMY     tah.bso  . CARPAL TUNNEL RELEASE     pt states did not have surgery just injections   . LAPAROSCOPIC GASTRIC SLEEVE RESECTION N/A 06/13/2016   Procedure: LAPAROSCOPIC GASTRIC SLEEVE RESECTION WITH HIATAL HERNIA REPAIR AND UPPER ENDOSCOPY;  Surgeon: Greer Pickerel, MD;  Location: [facility];  Service: General;  Laterality: N/A;  . LEEP    . TONSILLECTOMY    . VARICOSE VEIN SURGERY      Family History  Problem Relation Age of Onset  . Hyperlipidemia Mother   . Bipolar disorder Mother   . Heart attack Father   . Drug abuse Father   . Anxiety disorder Sister   . Depression Sister   . Anxiety disorder Sister   . Depression Sister   . ADD / ADHD Sister   . Alcohol abuse Sister   . Thyroid disease Maternal Grandmother    . Diabetes Maternal Grandmother   . Stroke Maternal Grandmother   . Ovarian cancer Neg Hx   . Breast cancer Neg Hx   . Colon cancer Neg Hx      Current Outpatient Medications:  .  levothyroxine (SYNTHROID) 25 MCG tablet, Take 1 tablet (25 mcg total) by mouth daily with breakfast. Take one daily and half on Sunday, Disp: 30 tablet, Rfl: 1  Allergies  Allergen Reactions  . Ciprofloxacin Hives  . Tramadol Rash    I personally reviewed active problem list, medication list, allergies, family history, social history with the patient/caregiver today.   ROS  Constitutional: Negative for fever , positive for  weight change.  Respiratory: Negative for cough and shortness of breath.   Cardiovascular: Negative for chest pain or palpitations.  Gastrointestinal: Negative for abdominal pain, no bowel changes.  Musculoskeletal: Negative for gait problem or joint swelling.  Skin: Negative for rash.  Neurological: Negative for dizziness or headache.  No other specific complaints in a complete review of systems (except as listed in HPI above).  Objective  Vitals:   08/06/19 0801  BP: 120/90  Pulse: 84  Resp: 16  Temp: (!) 96.9 F (36.1 C)  TempSrc: Temporal  SpO2: 98%  Weight: 278 lb 9.6 oz (126.4 kg)  Height: 5\' 3"  (1.6 m)    Body mass index is 49.35 kg/m.  Physical Exam  Constitutional: Patient appears well-developed and well-nourished. Obese No distress.  HEENT: head atraumatic, normocephalic, pupils equal and reactive to light Cardiovascular: Normal rate, regular rhythm and normal heart sounds.  No murmur heard. No BLE edema. Pulmonary/Chest: Effort normal and breath sounds normal. No respiratory distress. Abdominal: Soft.  There is no tenderness. Psychiatric: Patient has a normal mood and affect. behavior is normal. Judgment and thought content normal.  Recent Results (from the past 2160 hour(s))  Hemoglobin A1c     Status: Abnormal   Collection Time: 07/08/19  8:32  AM  Result Value Ref Range   Hgb A1c MFr Bld 5.8 (H) <5.7 % of total Hgb    Comment: For someone without known diabetes, a hemoglobin  A1c value between 5.7% and 6.4% is consistent with prediabetes and should be confirmed with a  follow-up test. . For someone with known diabetes, a value <7% indicates that their diabetes is well controlled. A1c targets should be individualized based on duration of diabetes, age, comorbid conditions, and other considerations. . This assay result is consistent with an increased risk of diabetes. . Currently, no consensus exists regarding use of hemoglobin A1c for diagnosis of diabetes for children. .    Mean Plasma Glucose 120 (calc)   eAG (mmol/L) 6.6 (calc)  TSH     Status: Abnormal   Collection Time: 07/08/19  8:32 AM  Result Value Ref Range   TSH 8.56 (H) mIU/L    Comment:  Reference Range .           > or = 20 Years  0.40-4.50 .                Pregnancy Ranges           First trimester    0.26-2.66           Second trimester   0.55-2.73           Third trimester    0.43-2.91       PHQ2/9: Depression screen [facility] 2/9 08/06/2019 08/06/2019 12/26/2018 11/16/2018 06/13/2018  Decreased Interest 0 0 0 0 0  Down, Depressed, Hopeless 0 0 0 0 0  PHQ - 2 Score 0 0 0 0 0  Altered sleeping 0 0 1 0 0  Tired, decreased energy 0 0 2 0 1  Change in appetite 0 0 2 0 2  Feeling bad or failure about yourself  0 0 1 0 0  Trouble concentrating 0 0 0 0 0  Moving slowly or fidgety/restless 0 0 0 0 0  Suicidal thoughts 0 0 0 0 -  PHQ-9 Score 0 0 6 0 3  Difficult doing work/chores Not difficult at all Not difficult at all Not difficult at all Not difficult at all Not difficult at all  Some recent data might be hidden    phq 9 is negative   Fall Risk: Fall Risk  08/06/2019 08/06/2019 12/26/2018 11/16/2018 06/13/2018  Falls in the past year? 0 0 0 1 0  Number falls in past yr: 0 0 0 0 -  Injury with Fall? 0 0 0 1 -  Comment - - - - -  Risk Factor  Category  - - - - -  Risk for fall due to : - - - History of fall(s) -  Risk for fall due to: Comment - - - - -  Follow up - - - Falls evaluation completed -     Functional Status Survey: Is the patient deaf or have difficulty hearing?: No Does the patient have difficulty seeing, even when wearing glasses/contacts?: No Does the patient have difficulty concentrating, remembering, or making decisions?: No Does the patient have difficulty walking or climbing stairs?: No Does the patient have difficulty dressing or bathing?: No Does the patient have difficulty doing errands alone such as visiting a doctor's office or shopping?: No    Assessment & Plan  1. Major depression in remission [facility])  Doing well at this time  2. Hypothyroidism (acquired)  Continue levothyroxine and recheck in 6 weeks  3. Prediabetes  On keto diet, discussed increasing physical activity   4. Status post bariatric surgery   5. Morbid obesity with BMI of 45.0-49.9, adult [facility])  Discussed with the patient the risk posed by an increased BMI. Discussed importance of portion control, calorie counting and at least 150 minutes of physical activity weekly. Avoid sweet beverages and drink more water. Eat at least 6 servings of fruit and vegetables daily    6. Attention deficit hyperactivity disorder (ADHD), predominantly inattentive type  Not currently on medication, she would like something low dose, we will try Focalin XR 5 mg  7. Needs flu shot  - Flu Vaccine QUAD 6+ mos PF IM (Fluarix Quad PF)

## 2019-08-17 ENCOUNTER — Encounter: Payer: Self-pay | Admitting: Family Medicine

## 2019-09-06 ENCOUNTER — Other Ambulatory Visit: Payer: Self-pay | Admitting: Family Medicine

## 2019-09-06 DIAGNOSIS — E039 Hypothyroidism, unspecified: Secondary | ICD-10-CM

## 2019-09-06 NOTE — Telephone Encounter (Signed)
Requested Prescriptions  Pending Prescriptions Disp Refills  . levothyroxine (SYNTHROID) 25 MCG tablet [Pharmacy Med Name: LEVOTHYROXINE SODIUM 25 MCG TAB] 90 tablet 0    Sig: TAKE 1 TABLET EVERY DAY ON EMPTY STOMACHWITH A GLASS OF WATER AT LEAST 30-60 MINBEFORE BREAKFAST---TAKE 1/2 TABLET ON SUNDAY     Endocrinology:  Hypothyroid Agents Failed - 09/06/2019  6:38 PM      Failed - TSH needs to be rechecked within 3 months after an abnormal result. Refill until TSH is due.      Failed - TSH in normal range and within 360 days    TSH  Date Value Ref Range Status  07/08/2019 8.56 (H) mIU/L Final    Comment:              Reference Range .           > or = 20 Years  0.40-4.50 .                Pregnancy Ranges           First trimester    0.26-2.66           Second trimester   0.55-2.73           Third trimester    0.43-2.91          Passed - Valid encounter within last 12 months    Recent Outpatient Visits          1 month ago Major depression in remission [facility])   [facility] Steele Sizer, MD   8 months ago Mild major depression [facility])   [facility] Steele Sizer, MD   9 months ago Shortness of breath   [place], [place]   1 year ago Mild major depression [facility])   [facility] Steele Sizer, MD   1 year ago Major depressive disorder, recurrent episode, moderate [facility])   [facility] Steele Sizer, MD      Future Appointments            In 2 months Ancil Boozer, Drue Stager, MD [facility], [place]   In 4 months Amalia Hailey, Nyoka Lint, MD [facility]

## 2019-11-02 ENCOUNTER — Other Ambulatory Visit: Payer: Self-pay | Admitting: Family Medicine

## 2019-11-02 DIAGNOSIS — E039 Hypothyroidism, unspecified: Secondary | ICD-10-CM

## 2019-11-02 LAB — TSH: TSH: 3.36 mIU/L

## 2019-11-02 MED ORDER — LEVOTHYROXINE SODIUM 25 MCG PO TABS: ORAL_TABLET | ORAL | 0 refills | Status: DC

## 2019-11-05 ENCOUNTER — Ambulatory Visit (INDEPENDENT_AMBULATORY_CARE_PROVIDER_SITE_OTHER): Payer: 59 | Admitting: Family Medicine

## 2019-11-05 ENCOUNTER — Other Ambulatory Visit: Payer: Self-pay

## 2019-11-05 ENCOUNTER — Encounter: Payer: Self-pay | Admitting: Family Medicine

## 2019-11-05 VITALS — BP 130/78 | HR 80 | Temp 97.8°F | Resp 16 | Ht 63.0 in | Wt 275.1 lb

## 2019-11-05 DIAGNOSIS — F325 Major depressive disorder, single episode, in full remission: Secondary | ICD-10-CM

## 2019-11-05 DIAGNOSIS — R7303 Prediabetes: Secondary | ICD-10-CM | POA: Diagnosis not present

## 2019-11-05 DIAGNOSIS — E039 Hypothyroidism, unspecified: Secondary | ICD-10-CM | POA: Diagnosis not present

## 2019-11-05 DIAGNOSIS — Z6841 Body Mass Index (BMI) 40.0 and over, adult: Secondary | ICD-10-CM

## 2019-11-05 DIAGNOSIS — M25532 Pain in left wrist: Secondary | ICD-10-CM

## 2019-11-05 DIAGNOSIS — F900 Attention-deficit hyperactivity disorder, predominantly inattentive type: Secondary | ICD-10-CM

## 2019-11-05 DIAGNOSIS — E781 Pure hyperglyceridemia: Secondary | ICD-10-CM

## 2019-11-05 DIAGNOSIS — M25562 Pain in left knee: Secondary | ICD-10-CM

## 2019-11-05 DIAGNOSIS — Z9884 Bariatric surgery status: Secondary | ICD-10-CM | POA: Diagnosis not present

## 2019-11-05 DIAGNOSIS — M25561 Pain in right knee: Secondary | ICD-10-CM

## 2019-11-05 MED ORDER — LEVOTHYROXINE SODIUM 25 MCG PO TABS: 25.0000 ug | ORAL_TABLET | Freq: Every day | ORAL | 1 refills | Status: DC

## 2019-11-05 NOTE — Progress Notes (Signed)
Name: Dana Whitaker   MRN: [id number]    DOB: 1975-08-01   Date:11/05/2019       Progress Note  Subjective  Chief Complaint  Chief Complaint  Patient presents with  . Medication Refill    3 month F/U  . Depression  . Hypothyroidism  . Obesity  . ADHD  . Hyperlipidemia  . Knee Pain    Bilateral worst when exercising-feels like their popping and crackling  . Wrist Pain    Went to [facility] for left wrist pain-put her on Prednisone and did not tolerate it.     HPI  Obesity: she has a long history of morbid obesity. Maximum of 298 lbs Dec 2017 prior to gastric sleeve surgery, she lost down to 238 lbs June 2018 but gradually gained back.She tried Vyvanse for binge eating but kept her awake at night.  She is on a keto diet since last Summer of 2020 weight was almost 300 lbs when she started and weight is down to 273 lbs today, weight has been stable since initial weight loss. ( 278 lbs since September 2020 ) . She joined a boot camp 4 weeks ago and is will continue going to try to improve the weight loss.   Bilateral knee pain: she is unable to run and has difficulty squatting and doing jumping jacks, she is going to boot camp and is trying to do exercise modification. She does not have pain when not active. Discussed importance of continue activity modification.   Left wrist pain: she noticed pain on left wrist for over 3 months, she went to [facility], she was advised to take prednisone taper but she never filled it. She states she tried a brace but did not help, pain on ulnar aspect of ventral left wrist. No edema or redness. She is right hand dominant. She types for work, Press photographer.   Hypothyroidism:last TSH was elevated but she states she was out of her medications, she has been compliant, taking once daily 25 mcg and last level at goal, we will send a refill to pharmacy today. Denies hair loss or change in bowel movements, she has chronic dry skin    History of LGSIL and endometrium dysplasia, history of hysterectomy, under the care of Dr. Amalia Hailey, had a CPE May 2020, had pap smear of vaginal cuff secondary to history of VAIN I. Reminded her to have yearly exam  Major depression/GAD/Borderline personality disorder : she was diagnosed by psychiatrist, but she states she is not sure if she has any of those diagnosis. She states she is doing well emotionally, she is tired but because she works long hours and is switching jobs, but denies lack of desire or feeling down depressed. She has a busy life helping her step-daughter that is 59 yo and also Kendall 18 months that she is helping raise ( used to be foster - now just helping out)   ADHD: son also has ADHD, diagnosed by Dr. Leonides Schanz, she took Ritalin in the past.Currently off medication. 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 Unchanged   Patient Active Problem List   Diagnosis Date Noted  . Vaginal dysplasia 01/09/2018  . LGSIL Pap smear of vagina 01/02/2018  . Vaginal high risk HPV DNA test positive 01/02/2018  . Hypothyroidism 11/28/2017  . Urgency of urination 11/10/2016  .  Surgical menopause 11/10/2016  . Cyst of left breast 08/30/2016  . History of malignant neoplasm of endometrium 06/29/2016  . 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 Fatty liver 06/13/2016  . Bilateral carpal tunnel syndrome 06/13/2016  . Status post bariatric surgery 06/13/2016  . Hypertriglyceridemia 05/19/2016  . History of fracture of left ankle 05/19/2016  . Former cigarette smoker 02/17/2016  . Prediabetes 02/17/2016  . Status post laparoscopic hysterectomy 11/10/2015  . Morbid obesity with BMI of 50.0-59.9, adult ([place]) 11/10/2015  . Borderline personality disorder ([place]) 01/13/2015  . ADHD (attention deficit hyperactivity disorder), combined type 01/13/2015  . Apnea,  sleep 01/13/2015  . Insomnia, persistent 01/13/2015  . Depression, major, recurrent, moderate ([place]) 01/13/2015  . Fibromyalgia 01/13/2015  . Classical migraine with intractable migraine 09/12/2014  . 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 ABDOMINAL HYSTERECTOMY     tah.bso  . CARPAL TUNNEL RELEASE     pt states did not have surgery just injections   . LAPAROSCOPIC GASTRIC SLEEVE RESECTION N/A 06/13/2016   Procedure: LAPAROSCOPIC GASTRIC SLEEVE RESECTION WITH HIATAL HERNIA REPAIR AND UPPER ENDOSCOPY;  Surgeon: Greer Pickerel, MD;  Location: [facility];  Service: General;  Laterality: N/A;  . LEEP    . TONSILLECTOMY    . VARICOSE VEIN SURGERY      Family History  Problem Relation Age of Onset  . Hyperlipidemia Mother   . Bipolar disorder Mother   . Heart attack Father   . Drug abuse Father   . Anxiety disorder Sister   . Depression Sister   . Anxiety disorder Sister   . Depression Sister   . ADD / ADHD Sister   . Alcohol abuse Sister   . Thyroid disease Maternal Grandmother   . Diabetes Maternal Grandmother   . Stroke Maternal Grandmother   . Ovarian cancer Neg Hx   . Breast cancer Neg Hx   . Colon cancer Neg Hx     Social History   Tobacco Use  . Smoking status: Former Smoker    Packs/day: 1.00    Years: 23.00    Pack years: 23.00    Types: Cigarettes    Start date: 02/05/1994    Quit date: 05/10/2016    Years since quitting: 3.4  . Smokeless tobacco: Never Used  Substance Use Topics  . Alcohol use: Yes    Alcohol/week: 0.0 standard drinks    Comment: occas     Current Outpatient Medications:  .  levothyroxine (SYNTHROID) 25 MCG tablet, TAKE 1 TABLET EVERY DAY ON EMPTY STOMACHWITH A GLASS OF WATER AT LEAST 30-60 MINBEFORE BREAKFAST---TAKE 1/2 TABLET ON SUNDAY, Disp: 90 tablet, Rfl: 0 .  dexmethylphenidate (FOCALIN XR) 5 MG 24 hr capsule, Take 1 capsule (5 mg total) by mouth daily. (Patient not taking:  Reported on 11/05/2019), Disp: 30 capsule, Rfl: 0  Allergies  Allergen Reactions  . Ciprofloxacin Hives  . Tramadol Rash    I personally reviewed active problem list, medication list, allergies, family history, social history, health maintenance with the patient/caregiver today.   ROS  Constitutional: Negative for fever or weight change.  Respiratory: Negative for cough and shortness of breath.   Cardiovascular: Negative for chest pain or palpitations.  Gastrointestinal: Negative for abdominal pain, no bowel changes.  Musculoskeletal: Negative for gait problem or joint swelling.  Skin: Negative for rash.  Neurological: Negative for dizziness or headache.  No other specific complaints in a complete review of systems (except as  listed in HPI above).  Objective  Vitals:   11/05/19 1151  BP: 130/78  Pulse: 80  Resp: 16  Temp: 97.8 F (36.6 C)  TempSrc: Temporal  SpO2: 98%  Weight: 275 lb 1.6 oz (124.8 kg)  Height: 5\' 3"  (1.6 m)    Body mass index is 48.73 kg/m.  Physical Exam  Constitutional: Patient appears well-developed and well-nourished. Obese No distress.  HEENT: head atraumatic, normocephalic, pupils equal and reactive to light Cardiovascular: Normal rate, regular rhythm and normal heart sounds.  No murmur heard. No BLE edema. Pulmonary/Chest: Effort normal and breath sounds normal. No respiratory distress. Abdominal: Soft.  There is no tenderness. Muscular Skeletal: pain during flexion of left wrist, no synovitis, crepitus with extension of both knees  Psychiatric: Patient has a normal mood and affect. behavior is normal. Judgment and thought content normal.  Recent Results (from the past 2160 hour(s))  TSH     Status: None   Collection Time: 11/01/19  3:04 PM  Result Value Ref Range   TSH 3.36 mIU/L    Comment:           Reference Range .           > or = 20 Years  0.40-4.50 .                Pregnancy Ranges           First trimester    0.26-2.66            Second trimester   0.55-2.73           Third trimester    0.43-2.91       PHQ2/9: Depression screen [facility] 2/9 11/05/2019 08/06/2019 08/06/2019 12/26/2018 11/16/2018  Decreased Interest 0 0 0 0 0  Down, Depressed, Hopeless 0 0 0 0 0  PHQ - 2 Score 0 0 0 0 0  Altered sleeping 3 0 0 1 0  Tired, decreased energy 2 0 0 2 0  Change in appetite 1 0 0 2 0  Feeling bad or failure about yourself  0 0 0 1 0  Trouble concentrating 0 0 0 0 0  Moving slowly or fidgety/restless 0 0 0 0 0  Suicidal thoughts 0 0 0 0 0  PHQ-9 Score 6 0 0 6 0  Difficult doing work/chores Somewhat difficult Not difficult at all Not difficult at all Not difficult at all Not difficult at all  Some recent data might be hidden    phq 9 is negative   Fall Risk: Fall Risk  11/05/2019 08/06/2019 08/06/2019 12/26/2018 11/16/2018  Falls in the past year? 1 0 0 0 1  Number falls in past yr: 0 0 0 0 0  Injury with Fall? 0 0 0 0 1  Comment - - - - -  Risk Factor Category  - - - - -  Risk for fall due to : - - - - History of fall(s)  Risk for fall due to: Comment - - - - -  Follow up - - - - Falls evaluation completed     Functional Status Survey: Is the patient deaf or have difficulty hearing?: No Does the patient have difficulty seeing, even when wearing glasses/contacts?: No Does the patient have difficulty concentrating, remembering, or making decisions?: No Does the patient have difficulty walking or climbing stairs?: No Does the patient have difficulty dressing or bathing?: No Does the patient have difficulty doing errands alone such as visiting a doctor's office or  shopping?: No    Assessment & Plan  1. Hypothyroidism (acquired)  - levothyroxine (SYNTHROID) 25 MCG tablet; Take 1 tablet (25 mcg total) by mouth daily before breakfast.  Dispense: 90 tablet; Refill: 1  2. Status post bariatric surgery   3. Prediabetes   4. Morbid obesity with BMI of 45.0-49.9, adult [facility])  Discussed with the patient the risk posed  by an increased BMI. Discussed importance of portion control, calorie counting and at least 150 minutes of physical activity weekly. Avoid sweet beverages and drink more water. Eat at least 6 servings of fruit and vegetables daily   5. Attention deficit hyperactivity disorder (ADHD), predominantly inattentive type  Not current on medication   6. Hypertriglyceridemia   7. Major depression in remission [facility])  Not done  8. Left wrist pain  - Ambulatory referral to Orthopedic Surgery  9. Bilateral anterior knee pain  - Ambulatory referral to Orthopedic Surgery

## 2020-01-08 ENCOUNTER — Other Ambulatory Visit (HOSPITAL_COMMUNITY)
Admission: RE | Admit: 2020-01-08 | Discharge: 2020-01-08 | Disposition: A | Discharge status: Home / Self Care | Payer: Self-pay | Source: Ambulatory Visit | Attending: Obstetrics and Gynecology | Admitting: Obstetrics and Gynecology

## 2020-01-08 ENCOUNTER — Encounter: Payer: Self-pay | Admitting: Obstetrics and Gynecology

## 2020-01-08 ENCOUNTER — Ambulatory Visit (INDEPENDENT_AMBULATORY_CARE_PROVIDER_SITE_OTHER): Payer: 59 | Admitting: Obstetrics and Gynecology

## 2020-01-08 ENCOUNTER — Other Ambulatory Visit: Payer: Self-pay

## 2020-01-08 VITALS — BP 126/72 | HR 73 | Ht 63.5 in | Wt 271.5 lb

## 2020-01-08 DIAGNOSIS — Z6841 Body Mass Index (BMI) 40.0 and over, adult: Secondary | ICD-10-CM

## 2020-01-08 DIAGNOSIS — Z124 Encounter for screening for malignant neoplasm of cervix: Secondary | ICD-10-CM | POA: Insufficient documentation

## 2020-01-08 DIAGNOSIS — Z01419 Encounter for gynecological examination (general) (routine) without abnormal findings: Secondary | ICD-10-CM

## 2020-01-08 DIAGNOSIS — Z1239 Encounter for other screening for malignant neoplasm of breast: Secondary | ICD-10-CM | POA: Diagnosis not present

## 2020-01-08 NOTE — Addendum Note (Signed)
Addended by: Durwin Glaze on: 01/08/2020 01:24 PM   Modules accepted: Orders

## 2020-01-08 NOTE — Progress Notes (Signed)
HPI:      [name] is a 44 y.o. G1P1001 who LMP was Patient's last menstrual period was 08/04/2013.  Subjective:   She presents today for her annual examination.  She has no complaints.  She states that she has started back at the gym and attempts to lose the weight that she gained over the last year with the gyms being closed for Covid.  She has previously had a gastric sleeve. Of significant note patient had endometrial cancer and is now 6 years out.  No further GYN oncology follow-up is scheduled. (She was seeing Dr. Theora Whitaker) Her thyroid lipids and A1c are followed by her primary care physician.   Hx: The following portions of the patient's history were reviewed and updated as appropriate:             She  has a past medical history of ADHD (attention deficit hyperactivity disorder), Anxiety, Bipolar disorder ([place]), Breast pain, Depression, Eczema, Falls, Fatigue, Fibromyalgia, GERD (gastroesophageal reflux disease), Headache, History of bronchitis, History of sprain of both ankles, Hyperlipemia, Obesity, Postcoital bleeding, Pre-diabetes, Sleep apnea, Uterine cancer ([place]), Vaginal dysplasia, and Varicose veins of lower extremity. She does not have any pertinent problems on file. She  has a past surgical history that includes Tonsillectomy; Abdominal hysterectomy; LEEP; Carpal tunnel release; Varicose vein surgery; and Laparoscopic gastric sleeve resection (N/A, 06/13/2016). Her family history includes ADD / ADHD in her sister; Alcohol abuse in her sister; Anxiety disorder in her sister and sister; Bipolar disorder in her mother; Depression in her sister and sister; Diabetes in her maternal grandmother; Drug abuse in her father; Heart attack in her father; Hyperlipidemia in her mother; Stroke in her maternal grandmother; Thyroid disease in her maternal grandmother. She  reports that she quit smoking about 3 years ago. Her smoking use included cigarettes. She started smoking about  25 years ago. She has a 23.00 pack-year smoking history. She has never used smokeless tobacco. She reports current alcohol use. She reports that she does not use drugs. She has a current medication list which includes the following prescription(s): levothyroxine. She is allergic to ciprofloxacin and tramadol.       Review of Systems:  Review of Systems  Constitutional: Denied constitutional symptoms, night sweats, recent illness, fatigue, fever, insomnia and weight loss.  Eyes: Denied eye symptoms, eye pain, photophobia, vision change and visual disturbance.  Ears/Nose/Throat/Neck: Denied ear, nose, throat or neck symptoms, hearing loss, nasal discharge, sinus congestion and sore throat.  Cardiovascular: Denied cardiovascular symptoms, arrhythmia, chest pain/pressure, edema, exercise intolerance, orthopnea and palpitations.  Respiratory: Denied pulmonary symptoms, asthma, pleuritic pain, productive sputum, cough, dyspnea and wheezing.  Gastrointestinal: Denied, gastro-esophageal reflux, melena, nausea and vomiting.  Genitourinary: Denied genitourinary symptoms including symptomatic vaginal discharge, pelvic relaxation issues, and urinary complaints.  Musculoskeletal: Denied musculoskeletal symptoms, stiffness, swelling, muscle weakness and myalgia.  Dermatologic: Denied dermatology symptoms, rash and scar.  Neurologic: Denied neurology symptoms, dizziness, headache, neck pain and syncope.  Psychiatric: Denied psychiatric symptoms, anxiety and depression.  Endocrine: Denied endocrine symptoms including hot flashes and night sweats.   Meds:   Current Outpatient Medications on File Prior to Visit  Medication Sig Dispense Refill  . levothyroxine (SYNTHROID) 25 MCG tablet Take 1 tablet (25 mcg total) by mouth daily before breakfast. 90 tablet 1   No current facility-administered medications on file prior to visit.    Objective:     Vitals:   01/08/20 0811  BP: 126/72  Pulse: 73  Physical examination General NAD, Conversant  HEENT Atraumatic; Op clear with mmm.  Normo-cephalic. Pupils reactive. Anicteric sclerae  Thyroid/Neck Smooth without nodularity or enlargement. Normal ROM.  Neck Supple.  Skin No rashes, lesions or ulceration. Normal palpated skin turgor. No nodularity.  Breasts: No masses or discharge.  Symmetric.  No axillary adenopathy.  Lungs: Clear to auscultation.No rales or wheezes. Normal Respiratory effort, no retractions.  Heart: NSR.  No murmurs or rubs appreciated. No periferal edema  Abdomen: Soft.  Non-tender.  No masses.  No HSM. No hernia  Extremities: Moves all appropriately.  Normal ROM for age. No lymphadenopathy.  Neuro: Oriented to PPT.  Normal mood. Normal affect.     Pelvic:   Vulva: Normal appearance.  No lesions.   Vagina: No lesions or abnormalities noted.  Support: Normal pelvic support.  Urethra No masses tenderness or scarring.  Meatus Normal size without lesions or prolapse.  Cervix: Surgically absent   Anus: Normal exam.  No lesions.  Perineum: Normal exam.  No lesions.        Bimanual   Uterus: Surgically absent   Adnexae: No masses.  Non-tender to palpation.  Cul-de-sac: Negative for abnormality.   Exam limited by patient body habitus  Assessment:    G1P1001 Patient Active Problem List   Diagnosis Date Noted  . Vaginal dysplasia 01/09/2018  . LGSIL Pap smear of vagina 01/02/2018  . Vaginal high risk HPV DNA test positive 01/02/2018  . Hypothyroidism 11/28/2017  . Urgency of urination 11/10/2016  . Surgical menopause 11/10/2016  . Cyst of left breast 08/30/2016  . History of malignant neoplasm of endometrium 06/29/2016  . 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 Fatty liver 06/13/2016  . Bilateral carpal tunnel syndrome 06/13/2016  . Status post bariatric surgery 06/13/2016  . Hypertriglyceridemia 05/19/2016  . History of fracture of left ankle 05/19/2016  . Former cigarette smoker 02/17/2016  . Prediabetes  02/17/2016  . Status post laparoscopic hysterectomy 11/10/2015  . Morbid obesity with BMI of 50.0-59.9, adult ([place]) 11/10/2015  . Borderline personality disorder ([place]) 01/13/2015  . ADHD (attention deficit hyperactivity disorder), combined type 01/13/2015  . Apnea, sleep 01/13/2015  . Insomnia, persistent 01/13/2015  . Depression, major, recurrent, moderate ([place]) 01/13/2015  . Fibromyalgia 01/13/2015  . Generalized anxiety disorder 01/13/2015  . Classical migraine with intractable migraine 09/12/2014  . 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 Well woman exam with routine gynecological exam   2. Encounter for breast cancer screening other than mammogram   3. Morbid obesity with BMI of 45.0-49.9, adult ([place])        Plan:            1.  Basic Screening Recommendations The basic screening recommendations for asymptomatic women were discussed with the patient during her visit.  The age-appropriate recommendations were discussed with her and the rational for the tests reviewed.  When I am informed by the patient that another primary care physician has previously obtained the age-appropriate tests and they are up-to-date, only outstanding tests are ordered and referrals given as necessary.  Abnormal results of tests will be discussed with her when all of her results are completed.  Routine preventative health maintenance measures emphasized: Exercise/Diet/Weight control, Tobacco Warnings, Alcohol/Substance use risks and Stress Management Mammogram ordered Orders Orders Placed This Encounter  Procedures  . MM 3D SCREEN BREAST BILATERAL    No orders of the defined types were placed in this encounter.       F/U  Return in  about 1 year (around 01/07/2021) for Annual Physical.  Finis Bud, M.D. 01/08/2020 9:01 AM

## 2020-01-12 LAB — CYTOLOGY - PAP
Comment: NEGATIVE
Diagnosis: NEGATIVE
Diagnosis: REACTIVE
High risk HPV: NEGATIVE

## 2020-01-17 ENCOUNTER — Inpatient Hospital Stay: Admission: RE | Admit: 2020-01-17 | Payer: Self-pay | Source: Ambulatory Visit

## 2020-01-28 ENCOUNTER — Ambulatory Visit
Admission: RE | Admit: 2020-01-28 | Discharge: 2020-01-28 | Disposition: A | Discharge status: Home / Self Care | Payer: 59 | Source: Ambulatory Visit | Attending: Obstetrics and Gynecology | Admitting: Obstetrics and Gynecology

## 2020-01-28 DIAGNOSIS — Z1231 Encounter for screening mammogram for malignant neoplasm of breast: Secondary | ICD-10-CM | POA: Insufficient documentation

## 2020-01-28 DIAGNOSIS — Z1239 Encounter for other screening for malignant neoplasm of breast: Secondary | ICD-10-CM

## 2020-02-14 ENCOUNTER — Telehealth: Payer: Self-pay | Admitting: Obstetrics and Gynecology

## 2020-02-14 DIAGNOSIS — Z01419 Encounter for gynecological examination (general) (routine) without abnormal findings: Secondary | ICD-10-CM

## 2020-02-14 NOTE — Telephone Encounter (Addendum)
Pt called in and stated that she is going to fax over a data collection sheet. The pt stated that her PCP needs Dr. Amalia Hailey to order the labs because he did her annual. I told the pt I will send a message to the nurse the pt verbally understood. Please advise

## 2020-02-18 NOTE — Telephone Encounter (Signed)
Scheduled patient to come in to have fatsing labs.

## 2020-02-19 ENCOUNTER — Other Ambulatory Visit: Payer: Self-pay

## 2020-02-19 ENCOUNTER — Other Ambulatory Visit: Payer: 59

## 2020-02-20 LAB — LIPID PANEL
Chol/HDL Ratio: 2.8 ratio (ref 0.0–4.4)
Cholesterol, Total: 204 mg/dL — ABNORMAL HIGH (ref 100–199)
HDL: 72 mg/dL
LDL Chol Calc (NIH): 120 mg/dL — ABNORMAL HIGH (ref 0–99)
Triglycerides: 68 mg/dL (ref 0–149)
VLDL Cholesterol Cal: 12 mg/dL (ref 5–40)

## 2020-02-20 LAB — GLUCOSE, RANDOM: Glucose: 88 mg/dL (ref 65–99)

## 2020-02-24 ENCOUNTER — Telehealth: Payer: Self-pay | Admitting: Obstetrics and Gynecology

## 2020-02-24 NOTE — Telephone Encounter (Signed)
Patient called in stating that she received her lab results via [place] and was wondering if her provider had completed the clinical data form she had sent in. Could you please advise?

## 2020-02-25 ENCOUNTER — Encounter: Payer: Self-pay | Admitting: Surgical

## 2020-02-25 NOTE — Telephone Encounter (Signed)
Tried to call patient. It says customer unavailable at this time. I am putting a copy of form up front if she calls back. Will send her a my chart message letting her know form is ready.

## 2020-02-25 NOTE — Telephone Encounter (Signed)
Pt called in and stated that she got her lab results back and she needs that [facility] form. The pt was told that the provider was out of the office on Monday. I told the pt that I will send a message to the nurse. Please advise

## 2020-02-27 NOTE — Progress Notes (Signed)
Name: Dana Whitaker   MRN: [id number]    DOB: 1976/01/29   Date:02/28/2020       Progress Note  Subjective  Chief Complaint  Chief Complaint  Patient presents with  . Depression  . Hypertension  . Hypothyroidism  . Medication Refill  . Hyperlipidemia    Her cholesterol was elevated for work screening and she was advised to follow up about her cholesterol.    HPI  Dyslipidemia: explained her level is not very high for her, she was worried when she saw results done by GYN, no significant family history of heart disease   The 10-year ASCVD risk score Mikey Bussing DC Brooke Bonito., et al., 2013) is: 1.3%   Values used to calculate the score:     Age: 44 years     Sex: Female     Is Non-Hispanic African American: No     Diabetic: No     Tobacco smoker: Yes     Systolic Blood Pressure: 902 mmHg     Is BP treated: No     HDL Cholesterol: 72 mg/dL     Total Cholesterol: 204 mg/dL   Hypothyroidism: she is taking levothyroxine 25 mcg daily, denies change in bowel movements, dry skin or dysphagia. We will recheck level since we will do labs today and LDL was slightly up.   Pre-diabetes: she denies polyphagia, polydipsia or polyuria.   Obesity: she was on keto for about year lowest weight was 271 lbs , stopped about one month ago and gained 5 lbs. She states she will try a carbohydrate restrictive diet, she states she will try to track her food.  Patient Active Problem List   Diagnosis Date Noted  . Vaginal dysplasia 01/09/2018  . LGSIL Pap smear of vagina 01/02/2018  . Vaginal high risk HPV DNA test positive 01/02/2018  . Hypothyroidism 11/28/2017  . Urgency of urination 11/10/2016  . Surgical menopause 11/10/2016  . Cyst of left breast 08/30/2016  . History of malignant neoplasm of endometrium 06/29/2016  . 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 Fatty liver 06/13/2016  . Bilateral carpal tunnel syndrome 06/13/2016  . Status post bariatric surgery 06/13/2016  . Hypertriglyceridemia 05/19/2016  .  History of fracture of left ankle 05/19/2016  . Former cigarette smoker 02/17/2016  . Prediabetes 02/17/2016  . Status post laparoscopic hysterectomy 11/10/2015  . Morbid obesity with BMI of 50.0-59.9, adult ([place]) 11/10/2015  . Borderline personality disorder ([place]) 01/13/2015  . ADHD (attention deficit hyperactivity disorder), combined type 01/13/2015  . Apnea, sleep 01/13/2015  . Insomnia, persistent 01/13/2015  . Depression, major, recurrent, moderate ([place]) 01/13/2015  . Fibromyalgia 01/13/2015  . Generalized anxiety disorder 01/13/2015  . Classical migraine with intractable migraine 09/12/2014  . 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 ABDOMINAL HYSTERECTOMY     tah.bso  . CARPAL TUNNEL RELEASE     pt states did not have surgery just injections   . LAPAROSCOPIC GASTRIC SLEEVE RESECTION N/A 06/13/2016   Procedure: LAPAROSCOPIC GASTRIC SLEEVE RESECTION WITH HIATAL HERNIA REPAIR AND UPPER ENDOSCOPY;  Surgeon: Greer Pickerel, MD;  Location: [facility];  Service: General;  Laterality: N/A;  . LEEP    . TONSILLECTOMY    . VARICOSE VEIN SURGERY      Family History  Problem Relation Age of Onset  . Hyperlipidemia Mother   . Bipolar disorder Mother   . Heart attack Father   . Drug abuse Father   . Anxiety disorder  Sister   . Depression Sister   . Anxiety disorder Sister   . Depression Sister   . ADD / ADHD Sister   . Alcohol abuse Sister   . Thyroid disease Maternal Grandmother   . Diabetes Maternal Grandmother   . Stroke Maternal Grandmother   . Ovarian cancer Neg Hx   . Breast cancer Neg Hx   . Colon cancer Neg Hx     Social History   Tobacco Use  . Smoking status: Former Smoker    Packs/day: 1.00    Years: 23.00    Pack years: 23.00    Types: Cigarettes    Start date: 02/05/1994    Quit date: 05/10/2016    Years since quitting: 3.8  . Smokeless tobacco: Never Used  Substance Use Topics  . Alcohol use: Yes     Alcohol/week: 0.0 standard drinks    Comment: occas     Current Outpatient Medications:  .  levothyroxine (SYNTHROID) 25 MCG tablet, Take 1 tablet (25 mcg total) by mouth daily before breakfast., Disp: 90 tablet, Rfl: 1  Allergies  Allergen Reactions  . Ciprofloxacin Hives  . Tramadol Rash    I personally reviewed active problem list, medication list, allergies, family history, social history with the patient/caregiver today.   ROS  Ten systems reviewed and is negative except as mentioned in HPI   Objective  Vitals:   02/28/20 1327  BP: 110/80  Pulse: 85  Resp: 16  Temp: 98.7 F (37.1 C)  TempSrc: Oral  SpO2: 96%  Weight: 277 lb 12.8 oz (126 kg)  Height: 5' 3.5" (1.613 m)    Body mass index is 48.44 kg/m.  Physical Exam  Constitutional: Patient appears well-developed and well-nourished. Obese  No distress.  HEENT: head atraumatic, normocephalic, pupils equal and reactive to light, neck supple Cardiovascular: Normal rate, regular rhythm and normal heart sounds.  No murmur heard. No BLE edema. Pulmonary/Chest: Effort normal and breath sounds normal. No respiratory distress. Abdominal: Soft.  There is no tenderness. Psychiatric: Patient has a normal mood and affect. behavior is normal. Judgment and thought content normal.  Recent Results (from the past 2160 hour(s))  Cytology - PAP     Status: None   Collection Time: 01/08/20  1:24 PM  Result Value Ref Range   High risk HPV Negative    Adequacy      Satisfactory for evaluation; transformation zone component PRESENT.   Diagnosis      - Negative for intraepithelial lesion or malignancy (NILM)   Diagnosis - Benign reactive/reparative changes    Comment Normal Reference Range HPV - Negative   Lipid panel     Status: Abnormal   Collection Time: 02/19/20  8:19 AM  Result Value Ref Range   Cholesterol, Total 204 (H) 100 - 199 mg/dL   Triglycerides 68 0 - 149 mg/dL   HDL 72 >39 mg/dL   VLDL Cholesterol Cal 12  5 - 40 mg/dL   LDL Chol Calc (NIH) 120 (H) 0 - 99 mg/dL   Chol/HDL Ratio 2.8 0.0 - 4.4 ratio    Comment:                                   T. Chol/HDL Ratio  Men  Women                               1/2 Avg.Risk  3.4    3.3                                   Avg.Risk  5.0    4.4                                2X Avg.Risk  9.6    7.1                                3X Avg.Risk 23.4   11.0   Glucose, random     Status: None   Collection Time: 02/19/20  8:19 AM  Result Value Ref Range   Glucose 88 65 - 99 mg/dL      PHQ2/9: Depression screen [facility] 2/9 02/28/2020 11/05/2019 08/06/2019 08/06/2019 12/26/2018  Decreased Interest 0 0 0 0 0  Down, Depressed, Hopeless 0 0 0 0 0  PHQ - 2 Score 0 0 0 0 0  Altered sleeping 2 3 0 0 1  Tired, decreased energy 1 2 0 0 2  Change in appetite 1 1 0 0 2  Feeling bad or failure about yourself  0 0 0 0 1  Trouble concentrating 1 0 0 0 0  Moving slowly or fidgety/restless 0 0 0 0 0  Suicidal thoughts 0 0 0 0 0  PHQ-9 Score 5 6 0 0 6  Difficult doing work/chores Not difficult at all Somewhat difficult Not difficult at all Not difficult at all Not difficult at all  Some recent data might be hidden    phq 9 is negative   Fall Risk: Fall Risk  02/28/2020 11/05/2019 08/06/2019 08/06/2019 12/26/2018  Falls in the past year? 0 1 0 0 0  Number falls in past yr: 0 0 0 0 0  Injury with Fall? 0 0 0 0 0  Comment - - - - -  Risk Factor Category  - - - - -  Risk for fall due to : - - - - -  Risk for fall due to: Comment - - - - -  Follow up - - - - -     Functional Status Survey: Is the patient deaf or have difficulty hearing?: No Does the patient have difficulty seeing, even when wearing glasses/contacts?: No Does the patient have difficulty concentrating, remembering, or making decisions?: No Does the patient have difficulty walking or climbing stairs?: Yes Does the patient have difficulty dressing or bathing?: No Does the  patient have difficulty doing errands alone such as visiting a doctor's office or shopping?: No    Assessment & Plan   1. Hypothyroidism, unspecified type  - TSH  2. Need for immunization against influenza  - Flu Vaccine QUAD 36+ mos IM  3. Need for hepatitis C screening test  - Hepatitis C antibody  4. Dyslipidemia  Reassurance given and discussed healthier diet   5. Prediabetes  - Hemoglobin A1c   6. Morbid obesity with BMI of 45.0-49.9, adult [facility])  Discussed with the patient the risk posed by an increased BMI. Discussed importance of portion control, calorie counting and at  least 150 minutes of physical activity weekly. Avoid sweet beverages and drink more water. Eat at least 6 servings of fruit and vegetables daily

## 2020-02-28 ENCOUNTER — Encounter: Payer: Self-pay | Admitting: Family Medicine

## 2020-02-28 ENCOUNTER — Other Ambulatory Visit: Payer: Self-pay

## 2020-02-28 ENCOUNTER — Ambulatory Visit (INDEPENDENT_AMBULATORY_CARE_PROVIDER_SITE_OTHER): Payer: 59 | Admitting: Family Medicine

## 2020-02-28 VITALS — BP 110/80 | HR 85 | Temp 98.7°F | Resp 16 | Ht 63.5 in | Wt 277.8 lb

## 2020-02-28 DIAGNOSIS — E039 Hypothyroidism, unspecified: Secondary | ICD-10-CM

## 2020-02-28 DIAGNOSIS — E785 Hyperlipidemia, unspecified: Secondary | ICD-10-CM | POA: Diagnosis not present

## 2020-02-28 DIAGNOSIS — Z1159 Encounter for screening for other viral diseases: Secondary | ICD-10-CM

## 2020-02-28 DIAGNOSIS — R7303 Prediabetes: Secondary | ICD-10-CM

## 2020-02-28 DIAGNOSIS — Z6841 Body Mass Index (BMI) 40.0 and over, adult: Secondary | ICD-10-CM

## 2020-02-28 DIAGNOSIS — Z23 Encounter for immunization: Secondary | ICD-10-CM

## 2020-03-02 LAB — TSH: TSH: 4.04 mIU/L

## 2020-03-02 LAB — HEMOGLOBIN A1C
Hgb A1c MFr Bld: 5.8 % of total Hgb — ABNORMAL HIGH (ref <5.7)
Mean Plasma Glucose: 120 (calc)
eAG (mmol/L): 6.6 (calc)

## 2020-03-02 LAB — HEPATITIS C ANTIBODY
Hepatitis C Ab: NONREACTIVE
SIGNAL TO CUT-OFF: 0.01 (ref <1.00)

## 2020-05-08 ENCOUNTER — Ambulatory Visit: Payer: 59 | Admitting: Family Medicine

## 2020-06-12 ENCOUNTER — Other Ambulatory Visit: Payer: Self-pay | Admitting: Family Medicine

## 2020-06-12 DIAGNOSIS — E039 Hypothyroidism, unspecified: Secondary | ICD-10-CM

## 2020-06-22 ENCOUNTER — Encounter: Payer: Self-pay | Admitting: Family Medicine

## 2020-07-31 ENCOUNTER — Encounter: Payer: Self-pay | Admitting: Family Medicine

## 2020-08-31 NOTE — Progress Notes (Signed)
Name: Dana Whitaker   MRN: [id number]    DOB: Apr 07, 1976   Date:09/01/2020       Progress Note  Subjective  Chief Complaint  Annual Exam  HPI  Patient presents for annual CPE and follow up - explained she may have additional charges   Dyslipidemia:   The 10-year ASCVD risk score Mikey Bussing DC Brooke Bonito., et al., 2013) is: 1.3%   Values used to calculate the score:     Age: 45 years     Sex: Female     Is Non-Hispanic African American: No     Diabetic: No     Tobacco smoker: Yes     Systolic Blood Pressure: 390 mmHg     Is BP treated: No     HDL Cholesterol: 72 mg/dL     Total Cholesterol: 204 mg/dL  Hypothyroidism: she is taking levothyroxine 25 mcg daily, denies change in bowel movements, she has intermittent dry skin ( worse on her feet). Denies dysphagia.  Pre-diabetes: she denies polyphagia, polydipsia or] polyuria. Last A1C was 5.8 % . A1C was higher prior to her bariatric surgery in 2017 - it was 6.3 %  Morbid obesity: she had bariatric surgery Dec 2017, her weight was 298 lbs prior to surgery, she went as low as 230 lbs, but gradually her weight went up , she has been in the 270 lbs for about one year. She is under the care of Dr. Redmond Pulling and is taking phentermine. She states it helps her curb her appetite   History of LGSIL and endometrium dysplasia, history of hysterectomy,under the care of Dr. Amalia Hailey, had a CPE July 2021 , had pap smear that was normal.   Major depression/GAD/Borderline personality disorder :she was diagnosed by psychiatrist years ago.  She states she is doing well emotionally, but on Phq 9 she feels bad about herself, problems sleeping and also change in appetite . She states she feels overwhelmed. Working full time and going to school, her husband is not helping with housework, she is feeling resentful , discussed marital counseling. She helps raise his daughter that is 3 yo   ADHD: son also has ADHD, diagnosed by Dr. Leonides Schanz, she took Ritalin in the  past.Vyvanse did not work well for her,she works as an Optometrist, we tried Engineer, manufacturing but not covered by Insurance underwriter . Currently not on medication   GERD: she was having regurgitation and heartburn but Dr. Redmond Pulling started her on pantoprazole last Fall and symptoms have been controlled since  Diet: no longer on keto diet  Exercise: needs to increase activity to 150 minutes per week.   [place] Visit from 11/16/2018 in [facility]  AUDIT-C Score 0     Depression: Phq 9 is  positive Depression screen [facility] 2/9 09/01/2020 02/28/2020 11/05/2019 08/06/2019 08/06/2019  Decreased Interest 0 0 0 0 0  Down, Depressed, Hopeless 0 0 0 0 0  PHQ - 2 Score 0 0 0 0 0  Altered sleeping 2 2 3  0 0  Tired, decreased energy 1 1 2  0 0  Change in appetite 2 1 1  0 0  Feeling bad or failure about yourself  1 0 0 0 0  Trouble concentrating 0 1 0 0 0  Moving slowly or fidgety/restless 0 0 0 0 0  Suicidal thoughts 0 0 0 0 0  PHQ-9 Score 6 5 6  0 0  Difficult doing work/chores - Not difficult at all Somewhat difficult Not difficult at all Not difficult at  all  Some recent data might be hidden   Hypertension: BP Readings from Last 3 Encounters:  09/01/20 110/72  02/28/20 110/80  01/08/20 126/72   Obesity: Wt Readings from Last 3 Encounters:  09/01/20 274 lb (124.3 kg)  02/28/20 277 lb 12.8 oz (126 kg)  01/08/20 271 lb 8 oz (123.2 kg)   BMI Readings from Last 3 Encounters:  09/01/20 47.03 kg/m  02/28/20 48.44 kg/m  01/08/20 47.34 kg/m     Vaccines:   HPV: up to at age 79 , ask insurance if age between 43-45  Pneumonia: educated and discussed with patient. Flu: educated and discussed with patient.  Hep C Screening: 02/28/20 STD testing and prevention (HIV/chl/gon/syphilis): 08/22/17 Intimate partner violence: negative Sexual History : very seldom has intercourse, no sex in the last year with her partner  Menstrual History/LMP/Abnormal Bleeding: s/p hysterectomy  Incontinence  Symptoms: no problems   Breast cancer:  - Last Mammogram:01/2020, ordered yearly by Gyn - Dr. Amalia Hailey  - BRCA gene screening: N/A  Osteoporosis: Discussed high calcium and vitamin D supplementation, weight bearing exercises  Cervical cancer screening: 01/08/20  Skin cancer: Discussed monitoring for atypical lesions  Colorectal cancer: NA  Lung cancer:  Low Dose CT Chest recommended if Age 47-80 years, 20 pack-year currently smoking OR have quit w/in 15years. Patient does not qualify.   ECG: 02/04/16  Advanced Care Planning: A voluntary discussion about advance care planning including the explanation and discussion of advance directives.  Discussed health care proxy and Living will, and the patient was able to identify a health care proxy as husband  .  Patient does not have a living will at present time. rt.  Lipids: Lab Results  Component Value Date   CHOL 204 (H) 02/19/2020   CHOL 191 11/16/2018   CHOL 187 08/22/2017   Lab Results  Component Value Date   HDL 72 02/19/2020   HDL 79 11/16/2018   HDL 74 08/22/2017   Lab Results  Component Value Date   LDLCALC 120 (H) 02/19/2020   LDLCALC 96 11/16/2018   LDLCALC 96 08/22/2017   Lab Results  Component Value Date   TRIG 68 02/19/2020   TRIG 72 11/16/2018   TRIG 76 08/22/2017   Lab Results  Component Value Date   CHOLHDL 2.8 02/19/2020   CHOLHDL 2.4 11/16/2018   CHOLHDL 2.5 08/22/2017   No results found for: LDLDIRECT  Glucose: Glucose  Date Value Ref Range Status  02/19/2020 88 65 - 99 mg/dL Final  12/05/2016 79 65 - 99 mg/dL Final  03/07/2014 112 (H) 65 - 99 mg/dL Final  08/14/2013 95 65 - 99 mg/dL Final   Glucose, Bld  Date Value Ref Range Status  11/16/2018 88 65 - 99 mg/dL Final    Comment:    .            Fasting reference interval .   08/22/2017 95 65 - 139 mg/dL Final    Comment:    .        Non-fasting reference interval .   06/14/2016 148 (H) 65 - 99 mg/dL Final   Glucose-Capillary  Date Value  Ref Range Status  06/13/2016 91 65 - 99 mg/dL Final    Patient Active Problem List   Diagnosis Date Noted  . Vaginal dysplasia 01/09/2018  . LGSIL Pap smear of vagina 01/02/2018  . Vaginal high risk HPV DNA test positive 01/02/2018  . Hypothyroidism 11/28/2017  . Urgency of urination 11/10/2016  . Surgical menopause 11/10/2016  .  Cyst of left breast 08/30/2016  . History of malignant neoplasm of endometrium 06/29/2016  . 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 Fatty liver 06/13/2016  . Bilateral carpal tunnel syndrome 06/13/2016  . Status post bariatric surgery 06/13/2016  . Hypertriglyceridemia 05/19/2016  . History of fracture of left ankle 05/19/2016  . Former cigarette smoker 02/17/2016  . Prediabetes 02/17/2016  . Status post laparoscopic hysterectomy 11/10/2015  . Morbid obesity with BMI of 50.0-59.9, adult ([place]) 11/10/2015  . Borderline personality disorder ([place]) 01/13/2015  . ADHD (attention deficit hyperactivity disorder), combined type 01/13/2015  . Apnea, sleep 01/13/2015  . Insomnia, persistent 01/13/2015  . Depression, major, recurrent, moderate ([place]) 01/13/2015  . Fibromyalgia 01/13/2015  . Generalized anxiety disorder 01/13/2015  . Classical migraine with intractable migraine 09/12/2014  . 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 ABDOMINAL HYSTERECTOMY     [place].bso  . CARPAL TUNNEL RELEASE     pt states did not have surgery just injections   . LAPAROSCOPIC GASTRIC SLEEVE RESECTION N/A 06/13/2016   Procedure: LAPAROSCOPIC GASTRIC SLEEVE RESECTION WITH HIATAL HERNIA REPAIR AND UPPER ENDOSCOPY;  Surgeon: Greer Pickerel, MD;  Location: [facility];  Service: General;  Laterality: N/A;  . LEEP    . TONSILLECTOMY    . VARICOSE VEIN SURGERY      Family History  Problem Relation Age of Onset  . Hyperlipidemia Mother   . Bipolar disorder Mother   . Heart attack Father   . Drug abuse Father   . Anxiety disorder  Sister   . Depression Sister   . Anxiety disorder Sister   . Depression Sister   . ADD / ADHD Sister   . Alcohol abuse Sister   . Thyroid disease Maternal Grandmother   . Diabetes Maternal Grandmother   . Stroke Maternal Grandmother   . Ovarian cancer Neg Hx   . Breast cancer Neg Hx   . Colon cancer Neg Hx     Social History   Socioeconomic History  . Marital status: Married    Spouse name: Not on file  . Number of children: 1  . Years of education: Not on file  . Highest education level: Associate degree: academic program  Occupational History  . Occupation: Optometrist  Tobacco Use  . Smoking status: Former Smoker    Packs/day: 1.00    Years: 23.00    Pack years: 23.00    Types: Cigarettes    Start date: 02/05/1994    Quit date: 05/10/2016    Years since quitting: 4.3  . Smokeless tobacco: Never Used  Vaping Use  . Vaping Use: Never used  Substance and Sexual Activity  . Alcohol use: Yes    Alcohol/week: 0.0 standard drinks    Comment: occas  . Drug use: No  . Sexual activity: Yes    Birth control/protection: Surgical  Other Topics Concern  . Not on file  Social History Narrative   Married,    Has a grown son, raising step-daughter, also fostering/raising another girl   Going to school, trying to finish her bachelor in business administration    Social Determinants of Health   Financial Resource Strain: Medium Risk  . Difficulty of Paying Living Expenses: Somewhat hard  Food Insecurity: No Food Insecurity  . Worried About Charity fundraiser in the Last Year: Never true  . Ran Out of Food in the Last Year: Never true  Transportation Needs: No Transportation Needs  . Lack  of Transportation (Medical): No  . Lack of Transportation (Non-Medical): No  Physical Activity: Inactive  . Days of Exercise per Week: 0 days  . Minutes of Exercise per Session: 0 min  Stress: Stress Concern Present  . Feeling of Stress : To some extent  Social Connections: Socially  Integrated  . Frequency of Communication with Friends and Family: Three times a week  . Frequency of Social Gatherings with Friends and Family: Once a week  . Attends Religious Services: More than 4 times per year  . Active Member of Clubs or Organizations: Yes  . Attends Archivist Meetings: More than 4 times per year  . Marital Status: Married  Human resources officer Violence: Not At Risk  . Fear of Current or Ex-Partner: No  . Emotionally Abused: No  . Physically Abused: No  . Sexually Abused: No     Current Outpatient Medications:  .  levothyroxine (SYNTHROID) 25 MCG tablet, TAKE 1 TABLET BY MOUTH DAILY BEFORE BREAKFAST, Disp: 90 tablet, Rfl: 0 .  pantoprazole (PROTONIX) 40 MG tablet, Take by mouth., Disp: , Rfl:  .  phentermine 37.5 MG capsule, Take by mouth., Disp: , Rfl:   Allergies  Allergen Reactions  . Ciprofloxacin Hives  . Tramadol Rash     ROS  Constitutional: Negative for fever or weight change.  Respiratory: Negative for cough and shortness of breath.   Cardiovascular: Negative for chest pain or palpitations.  Gastrointestinal: Negative for abdominal pain, no bowel changes.  Musculoskeletal: Negative for gait problem or joint swelling.  Skin: Negative for rash.  Neurological: Negative for dizziness or headache.  No other specific complaints in a complete review of systems (except as listed in HPI above).   Objective  Vitals:   09/01/20 0759  BP: 110/72  Pulse: 89  Resp: 16  Temp: 98.3 F (36.8 C)  TempSrc: Oral  SpO2: 99%  Weight: 274 lb (124.3 kg)  Height: 5' 4"  (1.626 m)    Body mass index is 47.03 kg/m.  Physical Exam  Constitutional: Patient appears well-developed and well-nourished. No distress.  HENT: Head: Normocephalic and atraumatic. Ears: B TMs ok, no erythema or effusion; Nose: Not done  Mouth/Throat: not done .  Eyes: Conjunctivae and EOM are normal. Pupils are equal, round, and reactive to light. No scleral icterus.  Neck:  Normal range of motion. Neck supple. No JVD present. No thyromegaly present.  Cardiovascular: Normal rate, regular rhythm and normal heart sounds.  No murmur heard. No BLE edema. Pulmonary/Chest: Effort normal and breath sounds normal. No respiratory distress. Abdominal: Soft. Bowel sounds are normal, no distension. There is no tenderness. no masses Breast: no lumps or masses, no nipple discharge or rashes FEMALE GENITALIA:  Not done  RECTAL: not done  Musculoskeletal: Normal range of motion, no joint effusions. No gross deformities Neurological: he is alert and oriented to person, place, and time. No cranial nerve deficit. Coordination, balance, strength, speech and gait are normal.  Skin: Skin is warm and dry. No rash noted. No erythema.  Psychiatric: Patient has a normal mood and affect. behavior is normal. Judgment and thought content normal.  Fall Risk: Fall Risk  09/01/2020 02/28/2020 11/05/2019 08/06/2019 08/06/2019  Falls in the past year? 0 0 1 0 0  Number falls in past yr: 0 0 0 0 0  Injury with Fall? 0 0 0 0 0  Comment - - - - -  Risk Factor Category  - - - - -  Risk for fall due to : - - - - -  Risk for fall due to: Comment - - - - -  Follow up - - - - -     Functional Status Survey: Is the patient deaf or have difficulty hearing?: No Does the patient have difficulty seeing, even when wearing glasses/contacts?: No Does the patient have difficulty concentrating, remembering, or making decisions?: Yes Does the patient have difficulty walking or climbing stairs?: Yes Does the patient have difficulty dressing or bathing?: No Does the patient have difficulty doing errands alone such as visiting a doctor's office or shopping?: No   Assessment & Plan  1. Morbid obesity with BMI of 45.0-49.9, adult [facility])  Discussed with the patient the risk posed by an increased BMI. Discussed importance of portion control, calorie counting and at least 150 minutes of physical activity weekly. Avoid  sweet beverages and drink more water. Eat at least 6 servings of fruit and vegetables daily   2. Hypothyroidism (acquired)  - TSH  3. Dyslipidemia  - Lipid panel  4. Well adult exam  - Lipid panel - COMPLETE METABOLIC PANEL WITH GFR - CBC with Differential/Platelet - Hemoglobin A1c - TSH  5. Prediabetes  - Hemoglobin A1c  6. Status post bariatric surgery  - Vitamin B12 - VITAMIN D 25 Hydroxy (Vit-D Deficiency, Fractures)  7. Mild recurrent major depression ([place])  Discussed referral to psychiatrist but she wants to hold off for now   8. GERD without esophagitis  Continue PPI  9. Vitamin B12 deficiency  - Vitamin B12  -USPSTF grade A and B recommendations reviewed with patient; age-appropriate recommendations, preventive care, screening tests, etc discussed and encouraged; healthy living encouraged; see AVS for patient education given to patient -Discussed importance of 150 minutes of physical activity weekly, eat two servings of fish weekly, eat one serving of tree nuts ( cashews, pistachios, pecans, almonds.Marland Kitchen) every other day, eat 6 servings of fruit/vegetables daily and drink plenty of water and avoid sweet beverages.

## 2020-09-01 ENCOUNTER — Ambulatory Visit (INDEPENDENT_AMBULATORY_CARE_PROVIDER_SITE_OTHER): Payer: 59 | Admitting: Family Medicine

## 2020-09-01 ENCOUNTER — Encounter: Payer: Self-pay | Admitting: Family Medicine

## 2020-09-01 ENCOUNTER — Other Ambulatory Visit: Payer: Self-pay

## 2020-09-01 VITALS — BP 110/72 | HR 89 | Temp 98.3°F | Resp 16 | Ht 64.0 in | Wt 274.0 lb

## 2020-09-01 DIAGNOSIS — K219 Gastro-esophageal reflux disease without esophagitis: Secondary | ICD-10-CM

## 2020-09-01 DIAGNOSIS — E039 Hypothyroidism, unspecified: Secondary | ICD-10-CM

## 2020-09-01 DIAGNOSIS — E785 Hyperlipidemia, unspecified: Secondary | ICD-10-CM

## 2020-09-01 DIAGNOSIS — Z6841 Body Mass Index (BMI) 40.0 and over, adult: Secondary | ICD-10-CM

## 2020-09-01 DIAGNOSIS — E538 Deficiency of other specified B group vitamins: Secondary | ICD-10-CM

## 2020-09-01 DIAGNOSIS — R7303 Prediabetes: Secondary | ICD-10-CM

## 2020-09-01 DIAGNOSIS — Z0000 Encounter for general adult medical examination without abnormal findings: Secondary | ICD-10-CM

## 2020-09-01 DIAGNOSIS — F330 Major depressive disorder, recurrent, mild: Secondary | ICD-10-CM

## 2020-09-01 DIAGNOSIS — Z9884 Bariatric surgery status: Secondary | ICD-10-CM

## 2020-09-01 NOTE — Patient Instructions (Signed)
Preventive Care 84-44 Years Old, Female Preventive care refers to lifestyle choices and visits with your health care provider that can promote health and wellness. This includes:  A yearly physical exam. This is also called an annual wellness visit.  Regular dental and eye exams.  Immunizations.  Screening for certain conditions.  Healthy lifestyle choices, such as: ? Eating a healthy diet. ? Getting regular exercise. ? Not using drugs or products that contain nicotine and tobacco. ? Limiting alcohol use. What can I expect for my preventive care visit? Physical exam Your health care provider will check your:  Height and weight. These may be used to calculate your BMI (body mass index). BMI is a measurement that tells if you are at a healthy weight.  Heart rate and blood pressure.  Body temperature.  Skin for abnormal spots. Counseling Your health care provider may ask you questions about your:  Past medical problems.  Family's medical history.  Alcohol, tobacco, and drug use.  Emotional well-being.  Home life and relationship well-being.  Sexual activity.  Diet, exercise, and sleep habits.  Work and work Statistician.  Access to firearms.  Method of birth control.  Menstrual cycle.  Pregnancy history. What immunizations do I need? Vaccines are usually given at various ages, according to a schedule. Your health care provider will recommend vaccines for you based on your age, medical history, and lifestyle or other factors, such as travel or where you work.   What tests do I need? Blood tests  Lipid and cholesterol levels. These may be checked every 5 years, or more often if you are over 3 years old.  Hepatitis C test.  Hepatitis B test. Screening  Lung cancer screening. You may have this screening every year starting at age 73 if you have a 30-pack-year history of smoking and currently smoke or have quit within the past 15 years.  Colorectal cancer  screening. ? All adults should have this screening starting at age 52 and continuing until age 17. ? Your health care provider may recommend screening at age 49 if you are at increased risk. ? You will have tests every 1-10 years, depending on your results and the type of screening test.  Diabetes screening. ? This is done by checking your blood sugar (glucose) after you have not eaten for a while (fasting). ? You may have this done every 1-3 years.  Mammogram. ? This may be done every 1-2 years. ? Talk with your health care provider about when you should start having regular mammograms. This may depend on whether you have a family history of breast cancer.  BRCA-related cancer screening. This may be done if you have a family history of breast, ovarian, tubal, or peritoneal cancers.  Pelvic exam and Pap test. ? This may be done every 3 years starting at age 10. ? Starting at age 11, this may be done every 5 years if you have a Pap test in combination with an HPV test. Other tests  STD (sexually transmitted disease) testing, if you are at risk.  Bone density scan. This is done to screen for osteoporosis. You may have this scan if you are at high risk for osteoporosis. Talk with your health care provider about your test results, treatment options, and if necessary, the need for more tests. Follow these instructions at home: Eating and drinking  Eat a diet that includes fresh fruits and vegetables, whole grains, lean protein, and low-fat dairy products.  Take vitamin and mineral supplements  as recommended by your health care provider.  Do not drink alcohol if: ? Your health care provider tells you not to drink. ? You are pregnant, may be pregnant, or are planning to become pregnant.  If you drink alcohol: ? Limit how much you have to 0-1 drink a day. ? Be aware of how much alcohol is in your drink. In the [place] one drink equals one 12 oz bottle of beer (355 mL), one 5 oz glass of  wine (148 mL), or one 1 oz glass of hard liquor (44 mL).   Lifestyle  Take daily care of your teeth and gums. Brush your teeth every morning and night with fluoride toothpaste. Floss one time each day.  Stay active. Exercise for at least 30 minutes 5 or more days each week.  Do not use any products that contain nicotine or tobacco, such as cigarettes, e-cigarettes, and chewing tobacco. If you need help quitting, ask your health care provider.  Do not use drugs.  If you are sexually active, practice safe sex. Use a condom or other form of protection to prevent STIs (sexually transmitted infections).  If you do not wish to become pregnant, use a form of birth control. If you plan to become pregnant, see your health care provider for a prepregnancy visit.  If told by your health care provider, take low-dose aspirin daily starting at age 50.  Find healthy ways to cope with stress, such as: ? Meditation, yoga, or listening to music. ? Journaling. ? Talking to a trusted person. ? Spending time with friends and family. Safety  Always wear your seat belt while driving or riding in a vehicle.  Do not drive: ? If you have been drinking alcohol. Do not ride with someone who has been drinking. ? When you are tired or distracted. ? While texting.  Wear a helmet and other protective equipment during sports activities.  If you have firearms in your house, make sure you follow all gun safety procedures. What's next?  Visit your health care provider once a year for an annual wellness visit.  Ask your health care provider how often you should have your eyes and teeth checked.  Stay up to date on all vaccines. This information is not intended to replace advice given to you by your health care provider. Make sure you discuss any questions you have with your health care provider. Document Revised: 03/24/2020 Document Reviewed: 03/01/2018 Elsevier Patient Education  2021 Elsevier Inc.  

## 2020-09-02 ENCOUNTER — Other Ambulatory Visit: Payer: Self-pay | Admitting: Family Medicine

## 2020-09-02 DIAGNOSIS — E039 Hypothyroidism, unspecified: Secondary | ICD-10-CM

## 2020-09-02 LAB — COMPLETE METABOLIC PANEL WITH GFR
AG Ratio: 1.4 (calc) (ref 1.0–2.5)
ALT: 18 U/L (ref 6–29)
AST: 13 U/L (ref 10–30)
Albumin: 4.1 g/dL (ref 3.6–5.1)
Alkaline phosphatase (APISO): 66 U/L (ref 31–125)
BUN: 14 mg/dL (ref 7–25)
CO2: 29 mmol/L (ref 20–32)
Calcium: 9.4 mg/dL (ref 8.6–10.2)
Chloride: 106 mmol/L (ref 98–110)
Creat: 0.82 mg/dL (ref 0.50–1.10)
GFR, Est African American: 102 mL/min/{1.73_m2} (ref >=60)
GFR, Est Non African American: 88 mL/min/{1.73_m2} (ref >=60)
Globulin: 2.9 g/dL (calc) (ref 1.9–3.7)
Glucose, Bld: 95 mg/dL (ref 65–99)
Potassium: 4.4 mmol/L (ref 3.5–5.3)
Sodium: 141 mmol/L (ref 135–146)
Total Bilirubin: 0.5 mg/dL (ref 0.2–1.2)
Total Protein: 7 g/dL (ref 6.1–8.1)

## 2020-09-02 LAB — CBC WITH DIFFERENTIAL/PLATELET
Absolute Monocytes: 350 cells/uL (ref 200–950)
Basophils Absolute: 42 cells/uL (ref 0–200)
Basophils Relative: 0.8 %
Eosinophils Absolute: 111 cells/uL (ref 15–500)
Eosinophils Relative: 2.1 %
HCT: 41.1 % (ref 35.0–45.0)
Hemoglobin: 13.6 g/dL (ref 11.7–15.5)
Lymphs Abs: 1373 cells/uL (ref 850–3900)
MCH: 29.8 pg (ref 27.0–33.0)
MCHC: 33.1 g/dL (ref 32.0–36.0)
MCV: 89.9 fL (ref 80.0–100.0)
MPV: 9.8 fL (ref 7.5–12.5)
Monocytes Relative: 6.6 %
Neutro Abs: 3424 cells/uL (ref 1500–7800)
Neutrophils Relative %: 64.6 %
Platelets: 278 10*3/uL (ref 140–400)
RBC: 4.57 10*6/uL (ref 3.80–5.10)
RDW: 13.7 % (ref 11.0–15.0)
Total Lymphocyte: 25.9 %
WBC: 5.3 10*3/uL (ref 3.8–10.8)

## 2020-09-02 LAB — HEMOGLOBIN A1C
Hgb A1c MFr Bld: 5.8 % of total Hgb — ABNORMAL HIGH (ref <5.7)
Mean Plasma Glucose: 120 mg/dL
eAG (mmol/L): 6.6 mmol/L

## 2020-09-02 LAB — LIPID PANEL
Cholesterol: 204 mg/dL — ABNORMAL HIGH (ref <200)
HDL: 76 mg/dL (ref >=50)
LDL Cholesterol (Calc): 115 mg/dL (calc) — ABNORMAL HIGH
Non-HDL Cholesterol (Calc): 128 mg/dL (calc) (ref <130)
Total CHOL/HDL Ratio: 2.7 (calc) (ref <5.0)
Triglycerides: 52 mg/dL (ref <150)

## 2020-09-02 LAB — VITAMIN B12: Vitamin B-12: 248 pg/mL (ref 200–1100)

## 2020-09-02 LAB — TSH: TSH: 8.45 mIU/L — ABNORMAL HIGH

## 2020-09-02 LAB — VITAMIN D 25 HYDROXY (VIT D DEFICIENCY, FRACTURES): Vit D, 25-Hydroxy: 23 ng/mL — ABNORMAL LOW (ref 30–100)

## 2020-09-02 MED ORDER — LEVOTHYROXINE SODIUM 50 MCG PO TABS: 50.0000 ug | ORAL_TABLET | Freq: Every day | ORAL | 0 refills | Status: DC

## 2020-10-06 ENCOUNTER — Other Ambulatory Visit: Payer: Self-pay | Admitting: Family Medicine

## 2020-10-06 DIAGNOSIS — E039 Hypothyroidism, unspecified: Secondary | ICD-10-CM

## 2020-10-06 MED ORDER — LEVOTHYROXINE SODIUM 50 MCG PO TABS: 50.0000 ug | ORAL_TABLET | Freq: Every day | ORAL | 0 refills | Status: DC

## 2020-10-06 NOTE — Telephone Encounter (Signed)
Pt aware of prescription being sent to pharmacy and will stop by between 8-12 and 2-4 for labs

## 2020-10-22 LAB — TSH: TSH: 5.78 mIU/L — ABNORMAL HIGH

## 2020-10-25 ENCOUNTER — Other Ambulatory Visit: Payer: Self-pay | Admitting: Family Medicine

## 2020-10-25 DIAGNOSIS — E039 Hypothyroidism, unspecified: Secondary | ICD-10-CM

## 2020-10-25 MED ORDER — LEVOTHYROXINE SODIUM 50 MCG PO TABS: 50.0000 ug | ORAL_TABLET | Freq: Every day | ORAL | 1 refills | Status: DC

## 2020-10-31 ENCOUNTER — Encounter: Payer: Self-pay | Admitting: Family Medicine

## 2020-11-02 ENCOUNTER — Other Ambulatory Visit: Payer: Self-pay | Admitting: Family Medicine

## 2020-11-02 ENCOUNTER — Other Ambulatory Visit: Payer: Self-pay

## 2020-11-02 MED ORDER — PANTOPRAZOLE SODIUM 40 MG PO TBEC: 40.0000 mg | DELAYED_RELEASE_TABLET | Freq: Every day | ORAL | 3 refills | Status: DC

## 2020-12-11 ENCOUNTER — Other Ambulatory Visit: Payer: Self-pay | Admitting: Family Medicine

## 2020-12-11 DIAGNOSIS — E039 Hypothyroidism, unspecified: Secondary | ICD-10-CM

## 2020-12-14 ENCOUNTER — Other Ambulatory Visit: Payer: Self-pay | Admitting: Family Medicine

## 2020-12-14 DIAGNOSIS — Z1231 Encounter for screening mammogram for malignant neoplasm of breast: Secondary | ICD-10-CM

## 2021-01-12 ENCOUNTER — Other Ambulatory Visit (HOSPITAL_COMMUNITY)
Admission: RE | Admit: 2021-01-12 | Discharge: 2021-01-12 | Disposition: A | Discharge status: Home / Self Care | Payer: 59 | Source: Ambulatory Visit | Attending: Obstetrics and Gynecology | Admitting: Obstetrics and Gynecology

## 2021-01-12 ENCOUNTER — Ambulatory Visit (INDEPENDENT_AMBULATORY_CARE_PROVIDER_SITE_OTHER): Payer: 59 | Admitting: Obstetrics and Gynecology

## 2021-01-12 ENCOUNTER — Encounter: Payer: Self-pay | Admitting: Obstetrics and Gynecology

## 2021-01-12 ENCOUNTER — Other Ambulatory Visit: Payer: Self-pay

## 2021-01-12 VITALS — BP 107/72 | HR 64 | Ht 63.75 in | Wt 292.1 lb

## 2021-01-12 DIAGNOSIS — Z01419 Encounter for gynecological examination (general) (routine) without abnormal findings: Secondary | ICD-10-CM | POA: Diagnosis not present

## 2021-01-12 DIAGNOSIS — Z8542 Personal history of malignant neoplasm of other parts of uterus: Secondary | ICD-10-CM | POA: Diagnosis not present

## 2021-01-12 DIAGNOSIS — Z6841 Body Mass Index (BMI) 40.0 and over, adult: Secondary | ICD-10-CM

## 2021-01-12 DIAGNOSIS — Z124 Encounter for screening for malignant neoplasm of cervix: Secondary | ICD-10-CM | POA: Diagnosis not present

## 2021-01-12 NOTE — Addendum Note (Signed)
Addended by: Durwin Glaze on: 01/12/2021 08:48 AM   Modules accepted: Orders

## 2021-01-12 NOTE — Progress Notes (Signed)
HPI:      Dana Whitaker is a 45 y.o. G1P1001 who LMP was Patient's last menstrual period was 08/04/2013.  Subjective:   She presents today for her annual examination.  She reports that she generally feels healthy with the exception of difficulty controlling her weight and her thyroid.  She has recently changed her dose of Synthroid. She is requesting a Pap this year because of her history of endometrial cancer and she states that she has had dysplasia on and off of the vaginal cuff. She is seeing Dr. Ancil Boozer for general medical care.    Hx: The following portions of the patient's history were reviewed and updated as appropriate:             She  has a past medical history of ADHD (attention deficit hyperactivity disorder), Anxiety, Bipolar disorder ([place]), Breast pain, Depression, Eczema, Falls, Fatigue, Fibromyalgia, GERD (gastroesophageal reflux disease), Headache, History of bronchitis, History of sprain of both ankles, Hyperlipemia, Obesity, Postcoital bleeding, Pre-diabetes, Sleep apnea, Uterine cancer ([place]), Vaginal dysplasia, and Varicose veins of lower extremity. She does not have any pertinent problems on file. She  has a past surgical history that includes Tonsillectomy; Abdominal hysterectomy; LEEP; Carpal tunnel release; Varicose vein surgery; and Laparoscopic gastric sleeve resection (N/A, 06/13/2016). Her family history includes ADD / ADHD in her sister; Alcohol abuse in her sister; Anxiety disorder in her sister and sister; Bipolar disorder in her mother; Depression in her sister and sister; Diabetes in her maternal grandmother; Drug abuse in her father; Heart attack in her father; Hyperlipidemia in her mother; Stroke in her maternal grandmother; Thyroid disease in her maternal grandmother. She  reports that she quit smoking about 4 years ago. Her smoking use included cigarettes. She started smoking about 26 years ago. She has a 23.00 pack-year smoking history. She has never  used smokeless tobacco. She reports current alcohol use. She reports that she does not use drugs. She has a current medication list which includes the following prescription(s): levothyroxine, pantoprazole, and phentermine. She is allergic to ciprofloxacin and tramadol.       Review of Systems:  Review of Systems  Constitutional: Denied constitutional symptoms, night sweats, recent illness, fatigue, fever, insomnia and weight loss.  Eyes: Denied eye symptoms, eye pain, photophobia, vision change and visual disturbance.  Ears/Nose/Throat/Neck: Denied ear, nose, throat or neck symptoms, hearing loss, nasal discharge, sinus congestion and sore throat.  Cardiovascular: Denied cardiovascular symptoms, arrhythmia, chest pain/pressure, edema, exercise intolerance, orthopnea and palpitations.  Respiratory: Denied pulmonary symptoms, asthma, pleuritic pain, productive sputum, cough, dyspnea and wheezing.  Gastrointestinal: Denied, gastro-esophageal reflux, melena, nausea and vomiting.  Genitourinary: Denied genitourinary symptoms including symptomatic vaginal discharge, pelvic relaxation issues, and urinary complaints.  Musculoskeletal: Denied musculoskeletal symptoms, stiffness, swelling, muscle weakness and myalgia.  Dermatologic: Denied dermatology symptoms, rash and scar.  Neurologic: Denied neurology symptoms, dizziness, headache, neck pain and syncope.  Psychiatric: Denied psychiatric symptoms, anxiety and depression.  Endocrine: Denied endocrine symptoms including hot flashes and night sweats.   Meds:   Current Outpatient Medications on File Prior to Visit  Medication Sig Dispense Refill   levothyroxine (SYNTHROID) 50 MCG tablet Take 1 tablet (50 mcg total) by mouth daily before breakfast. 30 tablet 1   pantoprazole (PROTONIX) 40 MG tablet Take 1 tablet (40 mg total) by mouth daily. 30 tablet 3   phentermine 37.5 MG capsule Take by mouth. (Patient not taking: Reported on 01/12/2021)     No  current facility-administered medications on file  prior to visit.          Objective:     Vitals:   01/12/21 0813  BP: 107/72  Pulse: 64    Filed Weights   01/12/21 0813  Weight: 292 lb 1.6 oz (132.5 kg)              Physical examination General NAD, Conversant  HEENT Atraumatic; Op clear with mmm.  Normo-cephalic. Pupils reactive. Anicteric sclerae  Thyroid/Neck Smooth without nodularity or enlargement. Normal ROM.  Neck Supple.  Skin No rashes, lesions or ulceration. Normal palpated skin turgor. No nodularity.  Breasts: No masses or discharge.  Symmetric.  No axillary adenopathy.  Lungs: Clear to auscultation.No rales or wheezes. Normal Respiratory effort, no retractions.  Heart: NSR.  No murmurs or rubs appreciated. No periferal edema  Abdomen: Soft.  Non-tender.  No masses.  No HSM. No hernia  Extremities: Moves all appropriately.  Normal ROM for age. No lymphadenopathy.  Neuro: Oriented to PPT.  Normal mood. Normal affect.     Pelvic:   Vulva: Normal appearance.  No lesions.  Vagina: No lesions or abnormalities noted.  Support: Normal pelvic support.  Urethra No masses tenderness or scarring.  Meatus Normal size without lesions or prolapse.  Cervix: Surgically absent  Anus: Normal exam.  No lesions.  Perineum: Normal exam.  No lesions.        Bimanual   Uterus: Surgically absent  Adnexae: No masses.  Non-tender to palpation.  Cul-de-sac: Negative for abnormality.     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   Fatty liver 06/13/2016   Bilateral carpal  tunnel syndrome 06/13/2016   Status post bariatric surgery 06/13/2016   Hypertriglyceridemia 05/19/2016   History of fracture of left ankle 05/19/2016   Former cigarette smoker 02/17/2016   Prediabetes 02/17/2016   Status post laparoscopic hysterectomy 11/10/2015   Morbid obesity with BMI of 50.0-59.9, adult ([place]) 11/10/2015   Borderline personality disorder ([place]) 01/13/2015   ADHD (attention deficit hyperactivity disorder), combined type 01/13/2015   Apnea, sleep 01/13/2015   Insomnia, persistent 01/13/2015   Depression, major, recurrent, moderate ([place]) 01/13/2015   Fibromyalgia 01/13/2015   Generalized anxiety disorder 01/13/2015   Classical migraine with intractable migraine 09/12/2014   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 Well woman exam with routine gynecological exam   2. Screening for cervical cancer   3. History of endometrial cancer   4. Morbid obesity with BMI of 50.0-59.9, adult ([place])        Plan:            1.  Basic Screening Recommendations The basic screening recommendations for asymptomatic women were discussed with the patient during her visit.  The age-appropriate recommendations were discussed with her and the rational for the tests reviewed.  When I am informed by the patient that another primary care physician has previously obtained the age-appropriate tests and they are up-to-date, only outstanding tests are ordered and referrals given as necessary.  Abnormal results of tests will be discussed with her when all of her results  are completed.  Routine preventative health maintenance measures emphasized: Exercise/Diet/Weight control, Tobacco Warnings, Alcohol/Substance use risks and Stress Management Vaginal cuff Pap performed at patient request  Orders No orders of the defined types were placed in this encounter.   No orders of the defined types were placed in this encounter.           F/U  No follow-ups on file.  Finis Bud,  M.D. 01/12/2021 8:38 AM

## 2021-01-13 ENCOUNTER — Encounter: Payer: Self-pay | Admitting: Family Medicine

## 2021-01-13 ENCOUNTER — Other Ambulatory Visit: Payer: Self-pay | Admitting: Family Medicine

## 2021-01-13 DIAGNOSIS — E039 Hypothyroidism, unspecified: Secondary | ICD-10-CM

## 2021-01-13 LAB — TSH: TSH: 7.18 mIU/L — ABNORMAL HIGH

## 2021-01-13 MED ORDER — LEVOTHYROXINE SODIUM 75 MCG PO TABS: 75.0000 ug | ORAL_TABLET | Freq: Every day | ORAL | 1 refills | Status: DC

## 2021-01-18 LAB — CYTOLOGY - PAP
Adequacy: ABSENT
Comment: NEGATIVE
Diagnosis: NEGATIVE
High risk HPV: NEGATIVE

## 2021-02-02 ENCOUNTER — Ambulatory Visit
Admission: RE | Admit: 2021-02-02 | Discharge: 2021-02-02 | Disposition: A | Discharge status: Home / Self Care | Payer: PRIVATE HEALTH INSURANCE | Source: Ambulatory Visit | Attending: Family Medicine | Admitting: Family Medicine

## 2021-02-02 ENCOUNTER — Other Ambulatory Visit: Payer: Self-pay

## 2021-02-02 DIAGNOSIS — Z1231 Encounter for screening mammogram for malignant neoplasm of breast: Secondary | ICD-10-CM | POA: Insufficient documentation

## 2021-03-04 NOTE — Progress Notes (Signed)
Name: Dana Whitaker   MRN: [id number]    DOB: 1976-05-24   Date:03/05/2021       Progress Note  Subjective  Chief Complaint  Follow Up  HPI  Dyslipidemia: LDL elevated but low risk of heart attacks or strokes    The 10-year ASCVD risk score Mikey Bussing DC Jr., et al., 2013) is: 1.7%   Values used to calculate the score:     Age: 45 years     Sex: Female     Is Non-Hispanic African American: No     Diabetic: No     Tobacco smoker: Yes     Systolic Blood Pressure: [id number] mmHg     Is BP treated: No     HDL Cholesterol: 76 mg/dL     Total Cholesterol: 204 mg/dL   Hypothyroidism: she is taking levothyroxine 75  mcg daily, denies change in bowel movements, she has intermittent dry skin ( worse on her feet). Denies dysphagia. She has gained weight we will recheck level, since she was previously 50 mcg and TSH was high   Pre-diabetes: she states she has polyphagia, polydipsia but no  polyuria. Last A1C was 5.8 % . A1C was higher prior to her bariatric surgery in 2017 - it was 6.3 %. Unchanged    Morbid obesity: she had bariatric surgery Dec 2017, her weight was 298 lbs prior to surgery, she went as low as 230 lbs, but gradually her weight went up , she was stabe around 270 lbs for about one year while on Keto diet. She saw Dr. Redmond Pulling and was  taking phentermine but she stopped taking because it was causing headaches and gained weight right back when she stopped taking it. Discussed Saxenda or Contrave, she does not want to use injectables. Discussed possible side effects and how to titrate dose   History of LGSIL and endometrium dysplasia, history of hysterectomy, under the care of Dr. Amalia Hailey, had a CPE July 2021 , repeat pap smear 2022 was also normal.    Major depression/GAD/Borderline personality disorder : she was diagnosed by psychiatrist years ago.  She states she is doing well emotionally, but on Phq 9 she feels bad about herself - frustrated about her weight. . She states she feels  overwhelmed.    ADHD: son also has ADHD, diagnosed by Dr. Leonides Schanz, she took Ritalin in the past. Vyvanse did not work well for her, she works as an Optometrist, we tried Engineer, manufacturing but not covered by Insurance underwriter .She has not been taking medication for a while but states she would like to try something again.   GERD: she has been taking Pantoprazole and denies side effects, regurgitation heartburn and indigestion resolved with medication  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/2016   Fatty liver 06/13/2016   Bilateral carpal tunnel syndrome 06/13/2016   Status post bariatric surgery 06/13/2016   Hypertriglyceridemia 05/19/2016   History of fracture of left ankle 05/19/2016   Former cigarette smoker 02/17/2016   Prediabetes 02/17/2016   Status post laparoscopic hysterectomy 11/10/2015   Morbid obesity with BMI of 50.0-59.9, adult ([place]) 11/10/2015  Borderline personality disorder ([place]) 01/13/2015   ADHD (attention deficit hyperactivity disorder), combined type 01/13/2015   Apnea, sleep 01/13/2015   Insomnia, persistent 01/13/2015   Depression, major, recurrent, moderate ([place]) 01/13/2015   Fibromyalgia 01/13/2015   Generalized anxiety disorder 01/13/2015   Classical migraine with intractable migraine 09/12/2014   Narcolepsy without cataplexy(347.00) 09/12/2014    Past Surgical History:  Procedure Laterality Date   ABDOMINAL HYSTERECTOMY     [place].[email or web address]   CARPAL TUNNEL RELEASE     pt states did not have surgery just injections    LAPAROSCOPIC GASTRIC SLEEVE RESECTION N/A 06/13/2016   Procedure: LAPAROSCOPIC GASTRIC  SLEEVE RESECTION WITH HIATAL HERNIA REPAIR AND UPPER ENDOSCOPY;  Surgeon: Greer Pickerel, MD;  Location: [facility];  Service: General;  Laterality: N/A;   LEEP     TONSILLECTOMY     VARICOSE VEIN SURGERY      Family History  Problem Relation Age of Onset   Hyperlipidemia Mother    Bipolar disorder Mother    Heart attack Father    Drug abuse Father    Anxiety disorder Sister    Depression Sister    Anxiety disorder Sister    Depression Sister    ADD / ADHD Sister    Alcohol abuse Sister    Thyroid disease Maternal Grandmother    Diabetes Maternal Grandmother    Stroke Maternal Grandmother    Ovarian cancer Neg Hx    Breast cancer Neg Hx    Colon cancer Neg Hx     Social History   Tobacco Use   Smoking status: Former    Packs/day: 1.00    Years: 23.00    Pack years: 23.00    Types: Cigarettes    Start date: 02/05/1994    Quit date: 05/10/2016    Years since quitting: 4.8   Smokeless tobacco: Never  Substance Use Topics   Alcohol use: Yes    Alcohol/week: 0.0 standard drinks    Comment: occas     Current Outpatient Medications:    amphetamine-dextroamphetamine (ADDERALL XR) 10 MG 24 hr capsule, Take 1 capsule (10 mg total) by mouth daily., Disp: 30 capsule, Rfl: 0   Cholecalciferol (VITAMIN D) 50 MCG (2000 UT) CAPS, Take 1 capsule (2,000 Units total) by mouth daily., Disp: 30 capsule, Rfl: 0   Cyanocobalamin (VITAMIN B-12) 1000 MCG SUBL, Place 1 tablet (1,000 mcg total) under the tongue daily., Disp: 100 tablet, Rfl: 0   levothyroxine (SYNTHROID) 75 MCG tablet, Take 1 tablet (75 mcg total) by mouth daily. And skip once a week, Disp: 30 tablet, Rfl: 1   Naltrexone-buPROPion HCl ER (CONTRAVE) 8-90 MG TB12, Start 1 tablet every morning for 7 days, then 1 tablet twice daily for 7 days, then 2 tablets every morning and one every evening, Disp: 120 tablet, Rfl: 2   pantoprazole (PROTONIX) 40 MG tablet, Take 1 tablet (40 mg total) by mouth daily., Disp: 90 tablet, Rfl: 1  Allergies   Allergen Reactions   Ciprofloxacin Hives   Tramadol Rash    I personally reviewed active problem list, medication list, allergies, family history, social history, health maintenance with the patient/caregiver today.   ROS  Constitutional: Negative for fever positive for weight change.  Respiratory: Negative for cough and shortness of breath.   Cardiovascular: Negative for chest pain or palpitations.  Gastrointestinal: Negative for abdominal pain, no bowel changes.  Musculoskeletal: Negative for gait problem or joint swelling.  Skin: Negative for rash.  Neurological: Negative for dizziness or headache.  No other specific complaints in a complete review of systems (except as listed in HPI above).   Objective  Vitals:   03/05/21 0742  BP: 132/82  Pulse: 70  Resp: 16  Temp: 98 F (36.7 C)  SpO2: 98%  Weight: 298 lb (135.2 kg)  Height: '5\' 4"'$  (1.626 m)    Body mass index is 51.15 kg/m.  Physical Exam  Constitutional: Patient appears well-developed and well-nourished. Obese  No distress.  HEENT: head atraumatic, normocephalic, pupils equal and reactive to light, neck supple Cardiovascular: Normal rate, regular rhythm and normal heart sounds.  No murmur heard. No BLE edema. Pulmonary/Chest: Effort normal and breath sounds normal. No respiratory distress. Abdominal: Soft.  There is no tenderness. Psychiatric: Patient has a normal mood and affect. behavior is normal. Judgment and thought content normal.   Recent Results (from the past 2160 hour(s))  Cytology - PAP     Status: None   Collection Time: 01/12/21  8:48 AM  Result Value Ref Range   High risk HPV Negative    Adequacy      Satisfactory for evaluation; transformation zone component ABSENT.   Diagnosis      - Negative for intraepithelial lesion or malignancy (NILM)   Comment Normal Reference Range HPV - Negative   TSH     Status: Abnormal   Collection Time: 01/12/21  8:58 AM  Result Value Ref Range   TSH 7.18  (H) mIU/L    Comment:           Reference Range .           > or = 20 Years  0.40-4.50 .                Pregnancy Ranges           First trimester    0.26-2.66           Second trimester   0.55-2.73           Third trimester    0.43-2.91      PHQ2/9: Depression screen [facility] 2/9 03/05/2021 09/01/2020 02/28/2020 11/05/2019 08/06/2019  Decreased Interest 0 0 0 0 0  Down, Depressed, Hopeless 0 0 0 0 0  PHQ - 2 Score 0 0 0 0 0  Altered sleeping '1 2 2 3 '$ 0  Tired, decreased energy '3 1 1 2 '$ 0  Change in appetite 0 '2 1 1 '$ 0  Feeling bad or failure about yourself  3 1 0 0 0  Trouble concentrating 1 0 1 0 0  Moving slowly or fidgety/restless 0 0 0 0 0  Suicidal thoughts 0 0 0 0 0  PHQ-9 Score '8 6 5 6 '$ 0  Difficult doing work/chores - - Not difficult at all Somewhat difficult Not difficult at all  Some recent data might be hidden    phq 9 is positive    Fall Risk: Fall Risk  03/05/2021 09/01/2020 02/28/2020 11/05/2019 08/06/2019  Falls in the past year? 0 0 0 1 0  Number falls in past yr: 0 0 0 0 0  Injury with Fall? 0 0 0 0 0  Comment - - - - -  Risk Factor Category  - - - - -  Risk for fall due to : No Fall Risks - - - -  Risk for fall due to: Comment - - - - -  Follow up Falls prevention discussed - - - -     Functional Status Survey: Is the patient deaf  or have difficulty hearing?: No Does the patient have difficulty seeing, even when wearing glasses/contacts?: No Does the patient have difficulty concentrating, remembering, or making decisions?: Yes Does the patient have difficulty walking or climbing stairs?: Yes Does the patient have difficulty dressing or bathing?: No Does the patient have difficulty doing errands alone such as visiting a doctor's office or shopping?: No    Assessment & Plan  1. Hypothyroidism (acquired)  - TSH  2. Prediabetes   3. Mild recurrent major depression ([place])  She does not want to take medications  4. Dyslipidemia   5. Vitamin B12  deficiency  Resume supplementation   6. Morbid obesity with BMI of 45.0-49.9, adult ([facility])  - Naltrexone-buPROPion HCl ER (CONTRAVE) 8-90 MG TB12; Start 1 tablet every morning for 7 days, then 1 tablet twice daily for 7 days, then 2 tablets every morning and one every evening  Dispense: 120 tablet; Refill: 2  7. GERD without esophagitis  - pantoprazole (PROTONIX) 40 MG tablet; Take 1 tablet (40 mg total) by mouth daily.  Dispense: 90 tablet; Refill: 1  8. Status post bariatric surgery   9. Attention deficit hyperactivity disorder (ADHD), predominantly inattentive type  - amphetamine-dextroamphetamine (ADDERALL XR) 10 MG 24 hr capsule; Take 1 capsule (10 mg total) by mouth daily.  Dispense: 30 capsule; Refill: 0    10. Vitamin D deficiency  - Cholecalciferol (VITAMIN D) 50 MCG (2000 UT) CAPS; Take 1 capsule (2,000 Units total) by mouth daily.  Dispense: 30 capsule; Refill: 0  11. Needs flu shot  - Flu Vaccine QUAD 6+ mos PF IM (Fluarix Quad PF)

## 2021-03-05 ENCOUNTER — Other Ambulatory Visit: Payer: Self-pay

## 2021-03-05 ENCOUNTER — Encounter: Payer: Self-pay | Admitting: Family Medicine

## 2021-03-05 ENCOUNTER — Ambulatory Visit: Payer: PRIVATE HEALTH INSURANCE | Admitting: Family Medicine

## 2021-03-05 VITALS — BP 132/82 | HR 70 | Temp 98.0°F | Resp 16 | Ht 64.0 in | Wt 298.0 lb

## 2021-03-05 DIAGNOSIS — F330 Major depressive disorder, recurrent, mild: Secondary | ICD-10-CM | POA: Diagnosis not present

## 2021-03-05 DIAGNOSIS — Z23 Encounter for immunization: Secondary | ICD-10-CM

## 2021-03-05 DIAGNOSIS — E039 Hypothyroidism, unspecified: Secondary | ICD-10-CM

## 2021-03-05 DIAGNOSIS — K219 Gastro-esophageal reflux disease without esophagitis: Secondary | ICD-10-CM

## 2021-03-05 DIAGNOSIS — R7303 Prediabetes: Secondary | ICD-10-CM

## 2021-03-05 DIAGNOSIS — E559 Vitamin D deficiency, unspecified: Secondary | ICD-10-CM

## 2021-03-05 DIAGNOSIS — E785 Hyperlipidemia, unspecified: Secondary | ICD-10-CM | POA: Diagnosis not present

## 2021-03-05 DIAGNOSIS — E538 Deficiency of other specified B group vitamins: Secondary | ICD-10-CM

## 2021-03-05 DIAGNOSIS — Z9884 Bariatric surgery status: Secondary | ICD-10-CM

## 2021-03-05 DIAGNOSIS — F900 Attention-deficit hyperactivity disorder, predominantly inattentive type: Secondary | ICD-10-CM

## 2021-03-05 DIAGNOSIS — Z6841 Body Mass Index (BMI) 40.0 and over, adult: Secondary | ICD-10-CM

## 2021-03-05 MED ORDER — AMPHETAMINE-DEXTROAMPHET ER 10 MG PO CP24: 10.0000 mg | ORAL_CAPSULE | Freq: Every day | ORAL | 0 refills | Status: DC

## 2021-03-05 MED ORDER — PANTOPRAZOLE SODIUM 40 MG PO TBEC: 40.0000 mg | DELAYED_RELEASE_TABLET | Freq: Every day | ORAL | 1 refills | Status: DC

## 2021-03-05 MED ORDER — VITAMIN B-12 1000 MCG SL SUBL: 1.0000 | SUBLINGUAL_TABLET | Freq: Every day | SUBLINGUAL | 0 refills | Status: DC

## 2021-03-05 MED ORDER — CONTRAVE 8-90 MG PO TB12: ORAL_TABLET | ORAL | 2 refills | Status: DC

## 2021-03-05 MED ORDER — VITAMIN D 50 MCG (2000 UT) PO CAPS: 1.0000 | ORAL_CAPSULE | Freq: Every day | ORAL | 0 refills | Status: DC

## 2021-03-05 NOTE — Patient Instructions (Signed)
Start Contrave one in am for one week and go up by one tablet every week to a max of 2 -  twice daily

## 2021-03-06 LAB — TSH: TSH: 3.97 mIU/L

## 2021-03-08 ENCOUNTER — Other Ambulatory Visit: Payer: Self-pay | Admitting: Family Medicine

## 2021-03-08 DIAGNOSIS — E039 Hypothyroidism, unspecified: Secondary | ICD-10-CM

## 2021-03-08 MED ORDER — LEVOTHYROXINE SODIUM 75 MCG PO TABS: 75.0000 ug | ORAL_TABLET | Freq: Every day | ORAL | 1 refills | Status: DC

## 2021-06-09 ENCOUNTER — Ambulatory Visit: Payer: PRIVATE HEALTH INSURANCE | Admitting: Family Medicine

## 2021-08-18 NOTE — Progress Notes (Signed)
Name: Dana Whitaker   MRN: [id number]    DOB: 1976-03-18   Date:08/19/2021       Progress Note  Subjective  Chief Complaint  Follow Up  HPI  Dyslipidemia: LDL elevated but low risk of heart attacks or strokes    The 10-year ASCVD risk score (Arnett DK, et al., 2019) is: 1.5%   Values used to calculate the score:     Age: 46 years     Sex: Female     Is Non-Hispanic African American: No     Diabetic: No     Tobacco smoker: Yes     Systolic Blood Pressure: 932 mmHg     Is BP treated: No     HDL Cholesterol: 76 mg/dL     Total Cholesterol: 204 mg/dL    Hypothyroidism: she is taking levothyroxine 75  mcg daily - very seldom she skips a dose , denies change in bowel movements, she has intermittent dry skin ( worse on her feet). Denies dysphagia. She has gained weight we will recheck level  Pre-diabetes: she states she has polyphagia, polydipsia but no polyuria. Last A1C was 5.8 % . A1C was higher prior to her bariatric surgery in 2017 - it was 6.3 % since surgery her highest A1C was 6 % . We will try using Rybelsus    Morbid obesity: she had bariatric surgery Dec 2017, her weight was 298 lbs prior to surgery, she went as low as 230 lbs, but gradually her weight went up , she was stable around 270 lbs for about one year while on Keto diet. She saw Dr. Redmond Pulling and was  taking phentermine but she stopped taking because it was causing headaches and gained weight right back when she stopped taking it. Discussed Saxenda or Contrave, she does not want to use injectables. Contrave was too expensive. Discussed Rybelsus - GLP-1 agonist - no family history of thyroid cancer, denies personal history of pancreatitis   History of LGSIL and endometrium dysplasia, history of hysterectomy, under the care of Dr. Amalia Hailey, had a CPE July 2021 , repeat pap smear 2022 was also normal she states gyn is going to do pap smears every 2 years if also normal in 2023     Major depression/GAD/Borderline  personality disorder : she was diagnosed by psychiatrist years ago.  She states she is doing well emotionally, and normal phq 9, she stopped adderal on her own, caused dry mouth and felt anxious    ADHD: son also has ADHD, diagnosed by Dr. Leonides Schanz, she took Ritalin in the past. Vyvanse did not work well for her, she works as an Optometrist, we tried Engineer, manufacturing but not covered by Insurance underwriter .We tried Adderal but it caused more anxiety and agitation   GERD: she has been taking Pantoprazole and denies side effects, regurgitation heartburn and indigestion resolved with medication. 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 She thinks she may also have OSA, we will try to get a copy of the study . 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/2016   Fatty liver 06/13/2016   Bilateral carpal tunnel syndrome 06/13/2016   Status post bariatric surgery 06/13/2016   Hypertriglyceridemia 05/19/2016   History of fracture of left ankle 05/19/2016   Former cigarette smoker 02/17/2016   Prediabetes 02/17/2016   Status post laparoscopic hysterectomy 11/10/2015   Morbid obesity with BMI of 50.0-59.9, adult ([place]) 11/10/2015   Borderline personality disorder ([place]) 01/13/2015   ADHD (attention deficit hyperactivity disorder), combined type 01/13/2015   Apnea, sleep 01/13/2015   Insomnia, persistent 01/13/2015   Depression, major, recurrent, moderate ([place]) 01/13/2015   Fibromyalgia 01/13/2015   Generalized anxiety disorder 01/13/2015   Classical  migraine with intractable migraine 09/12/2014   Narcolepsy without cataplexy(347.00) 09/12/2014    Past Surgical History:  Procedure Laterality Date   ABDOMINAL HYSTERECTOMY     [place].[email or web address]   CARPAL TUNNEL RELEASE     pt states did not have surgery just injections    LAPAROSCOPIC GASTRIC SLEEVE RESECTION N/A 06/13/2016   Procedure: LAPAROSCOPIC GASTRIC SLEEVE RESECTION WITH HIATAL HERNIA REPAIR AND UPPER ENDOSCOPY;  Surgeon: Greer Pickerel, MD;  Location: [facility];  Service: General;  Laterality: N/A;   LEEP     TONSILLECTOMY     VARICOSE VEIN SURGERY      Family History  Problem Relation Age of Onset   Hyperlipidemia Mother    Bipolar disorder Mother    Heart attack Father    Drug abuse Father    Anxiety disorder Sister    Depression Sister    Anxiety disorder Sister    Depression Sister    ADD / ADHD Sister    Alcohol abuse Sister    Thyroid disease Maternal Grandmother    Diabetes Maternal Grandmother    Stroke Maternal Grandmother    Ovarian cancer Neg Hx    Breast cancer Neg Hx    Colon cancer Neg Hx     Social History   Tobacco Use   Smoking status: Former    Packs/day: 1.00    Years: 23.00    Pack years: 23.00    Types: Cigarettes    Start date: 02/05/1994    Quit date: 05/10/2016    Years since quitting: 5.2   Smokeless tobacco: Never  Substance Use Topics   Alcohol use: Yes    Alcohol/week: 0.0 standard drinks    Comment: occas     Current Outpatient Medications:    amphetamine-dextroamphetamine (ADDERALL XR) 10 MG 24 hr capsule, Take 1 capsule (10 mg total) by mouth daily., Disp: 30 capsule, Rfl: 0   Cholecalciferol (VITAMIN D) 50 MCG (2000 UT) CAPS, Take 1 capsule (2,000 Units total) by mouth daily., Disp: 30 capsule, Rfl: 0   Cyanocobalamin (VITAMIN B-12) 1000 MCG SUBL, Place 1 tablet (1,000 mcg total) under the tongue daily., Disp: 100 tablet, Rfl: 0   levothyroxine (SYNTHROID) 75 MCG tablet, Take 1 tablet (75 mcg total) by mouth daily. And skip once a  week, Disp: 90 tablet, Rfl: 1   Naltrexone-buPROPion HCl ER (CONTRAVE) 8-90 MG TB12, Start 1 tablet every morning for 7 days, then 1 tablet twice daily for 7 days, then 2 tablets every morning and one every evening, Disp: 120 tablet, Rfl: 2   pantoprazole (PROTONIX) 40 MG tablet, Take 1 tablet (40 mg total) by mouth daily., Disp: 90 tablet, Rfl: 1  Allergies  Allergen Reactions   Ciprofloxacin Hives   Tramadol Rash    I personally reviewed active problem list, medication list,  allergies, family history, social history, health maintenance with the patient/caregiver today.   ROS  Constitutional: Negative for fever or weight change.  Respiratory: Negative for cough and shortness of breath.   Cardiovascular: Negative for chest pain or palpitations.  Gastrointestinal: Negative for abdominal pain, no bowel changes.  Musculoskeletal: Negative for gait problem or joint swelling.  Skin: Negative for rash.  Neurological: Negative for dizziness or headache.  No other specific complaints in a complete review of systems (except as listed in HPI above).   Objective  Vitals:   08/19/21 0739  BP: 122/80  Pulse: 87  Resp: 16  SpO2: 98%  Weight: (!) 309 lb (140.2 kg)  Height: 5\' 3"  (1.6 m)    Body mass index is 54.74 kg/m.  Physical Exam  Constitutional: Patient appears well-developed and well-nourished. Obese  No distress.  HEENT: head atraumatic, normocephalic, pupils equal and reactive to light, neck supple Cardiovascular: Normal rate, regular rhythm and normal heart sounds.  No murmur heard. No BLE edema. Pulmonary/Chest: Effort normal and breath sounds normal. No respiratory distress. Abdominal: Soft.  There is no tenderness. Skin: raised lesion on left upper back, referral placed to dermatologist  Psychiatric: Patient has a normal mood and affect. behavior is normal. Judgment and thought content normal.   PHQ2/9: Depression screen [facility] 2/9 08/19/2021 03/05/2021 09/01/2020 02/28/2020  11/05/2019  Decreased Interest 0 0 0 0 0  Down, Depressed, Hopeless 0 0 0 0 0  PHQ - 2 Score 0 0 0 0 0  Altered sleeping 0 1 2 2 3   Tired, decreased energy 0 3 1 1 2   Change in appetite 0 0 2 1 1   Feeling bad or failure about yourself  0 3 1 0 0  Trouble concentrating 0 1 0 1 0  Moving slowly or fidgety/restless 0 0 0 0 0  Suicidal thoughts 0 0 0 0 0  PHQ-9 Score 0 8 6 5 6   Difficult doing work/chores - - - Not difficult at all Somewhat difficult  Some recent data might be hidden    phq 9 is negative   Fall Risk: Fall Risk  08/19/2021 03/05/2021 09/01/2020 02/28/2020 11/05/2019  Falls in the past year? 0 0 0 0 1  Number falls in past yr: 0 0 0 0 0  Injury with Fall? 0 0 0 0 0  Comment - - - - -  Risk Factor Category  - - - - -  Risk for fall due to : No Fall Risks No Fall Risks - - -  Risk for fall due to: Comment - - - - -  Follow up Falls prevention discussed Falls prevention discussed - - -      Functional Status Survey: Is the patient deaf or have difficulty hearing?: No Does the patient have difficulty seeing, even when wearing glasses/contacts?: No Does the patient have difficulty concentrating, remembering, or making decisions?: No Does the patient have difficulty walking or climbing stairs?: No Does the patient have difficulty dressing or bathing?: No Does the patient have difficulty doing errands alone such as visiting a doctor's office or shopping?: No    Assessment & Plan  1. Mild recurrent major depression ([place])  Doing well at this time   2. Status post bariatric surgery  - CBC with Differential/Platelet - COMPLETE METABOLIC PANEL WITH GFR  3. Morbid obesity with body mass index (BMI) of 50.0 to 59.9 in adult [facility])  Discussed with the patient the risk posed by an increased BMI. Discussed importance  of portion control, calorie counting and at least 150 minutes of physical activity weekly. Avoid sweet beverages and drink more water. Eat at least 6 servings of  fruit and vegetables daily    4. GERD without esophagitis  - pantoprazole (PROTONIX) 40 MG tablet; Take 1 tablet (40 mg total) by mouth daily.  Dispense: 90 tablet; Refill: 1  5. Attention deficit hyperactivity disorder (ADHD), predominantly inattentive type   6. Vitamin B12 deficiency  - Vitamin B12  7. Dyslipidemia  - Lipid panel  8. Vitamin D deficiency  - VITAMIN D 25 Hydroxy (Vit-D Deficiency, Fractures)  9. Prediabetes  - Hemoglobin A1c - Semaglutide (RYBELSUS) 7 MG TABS; Take 7 mg by mouth daily.  Dispense: 90 tablet; Refill: 0  10. Hypothyroidism (acquired)  - TSH - levothyroxine (SYNTHROID) 75 MCG tablet; Take 1 tablet (75 mcg total) by mouth daily. And skip once a week  Dispense: 90 tablet; Refill: 1  11. Long-term use of high-risk medication  - CBC with Differential/Platelet - COMPLETE METABOLIC PANEL WITH GFR  12. Insulin resistance  - Semaglutide (RYBELSUS) 7 MG TABS; Take 7 mg by mouth daily.  Dispense: 90 tablet; Refill: 0  13. Numerous moles  - Ambulatory referral to Dermatology

## 2021-08-19 ENCOUNTER — Ambulatory Visit (INDEPENDENT_AMBULATORY_CARE_PROVIDER_SITE_OTHER): Payer: BC Managed Care – PPO | Admitting: Family Medicine

## 2021-08-19 ENCOUNTER — Other Ambulatory Visit: Payer: Self-pay

## 2021-08-19 ENCOUNTER — Encounter: Payer: Self-pay | Admitting: Family Medicine

## 2021-08-19 VITALS — BP 122/80 | HR 87 | Resp 16 | Ht 63.0 in | Wt 309.0 lb

## 2021-08-19 DIAGNOSIS — E039 Hypothyroidism, unspecified: Secondary | ICD-10-CM

## 2021-08-19 DIAGNOSIS — E538 Deficiency of other specified B group vitamins: Secondary | ICD-10-CM

## 2021-08-19 DIAGNOSIS — E88819 Insulin resistance, unspecified: Secondary | ICD-10-CM

## 2021-08-19 DIAGNOSIS — E8881 Metabolic syndrome: Secondary | ICD-10-CM

## 2021-08-19 DIAGNOSIS — K219 Gastro-esophageal reflux disease without esophagitis: Secondary | ICD-10-CM

## 2021-08-19 DIAGNOSIS — F330 Major depressive disorder, recurrent, mild: Secondary | ICD-10-CM | POA: Diagnosis not present

## 2021-08-19 DIAGNOSIS — E559 Vitamin D deficiency, unspecified: Secondary | ICD-10-CM

## 2021-08-19 DIAGNOSIS — Z6841 Body Mass Index (BMI) 40.0 and over, adult: Secondary | ICD-10-CM

## 2021-08-19 DIAGNOSIS — D229 Melanocytic nevi, unspecified: Secondary | ICD-10-CM

## 2021-08-19 DIAGNOSIS — E785 Hyperlipidemia, unspecified: Secondary | ICD-10-CM

## 2021-08-19 DIAGNOSIS — Z79899 Other long term (current) drug therapy: Secondary | ICD-10-CM

## 2021-08-19 DIAGNOSIS — G47419 Narcolepsy without cataplexy: Secondary | ICD-10-CM

## 2021-08-19 DIAGNOSIS — Z9884 Bariatric surgery status: Secondary | ICD-10-CM

## 2021-08-19 DIAGNOSIS — R7303 Prediabetes: Secondary | ICD-10-CM

## 2021-08-19 DIAGNOSIS — F900 Attention-deficit hyperactivity disorder, predominantly inattentive type: Secondary | ICD-10-CM

## 2021-08-19 MED ORDER — LEVOTHYROXINE SODIUM 75 MCG PO TABS: 75.0000 ug | ORAL_TABLET | Freq: Every day | ORAL | 1 refills | Status: DC

## 2021-08-19 MED ORDER — RYBELSUS 7 MG PO TABS: 7.0000 mg | ORAL_TABLET | Freq: Every day | ORAL | 0 refills | Status: DC

## 2021-08-19 MED ORDER — PANTOPRAZOLE SODIUM 40 MG PO TBEC: 40.0000 mg | DELAYED_RELEASE_TABLET | Freq: Every day | ORAL | 1 refills | Status: DC

## 2021-08-19 MED ORDER — MODAFINIL 100 MG PO TABS: 100.0000 mg | ORAL_TABLET | Freq: Every day | ORAL | 2 refills | Status: DC

## 2021-08-20 ENCOUNTER — Other Ambulatory Visit: Payer: Self-pay | Admitting: Family Medicine

## 2021-08-20 LAB — COMPLETE METABOLIC PANEL WITH GFR
AG Ratio: 1.4 (calc) (ref 1.0–2.5)
ALT: 19 U/L (ref 6–29)
AST: 13 U/L (ref 10–30)
Albumin: 4.2 g/dL (ref 3.6–5.1)
Alkaline phosphatase (APISO): 66 U/L (ref 31–125)
BUN: 16 mg/dL (ref 7–25)
CO2: 27 mmol/L (ref 20–32)
Calcium: 9.3 mg/dL (ref 8.6–10.2)
Chloride: 105 mmol/L (ref 98–110)
Creat: 0.78 mg/dL (ref 0.50–0.99)
Globulin: 2.9 g/dL (calc) (ref 1.9–3.7)
Glucose, Bld: 97 mg/dL (ref 65–99)
Potassium: 4.5 mmol/L (ref 3.5–5.3)
Sodium: 141 mmol/L (ref 135–146)
Total Bilirubin: 0.5 mg/dL (ref 0.2–1.2)
Total Protein: 7.1 g/dL (ref 6.1–8.1)
eGFR: 96 mL/min/{1.73_m2} (ref >=60)

## 2021-08-20 LAB — CBC WITH DIFFERENTIAL/PLATELET
Absolute Monocytes: 422 cells/uL (ref 200–950)
Basophils Absolute: 37 cells/uL (ref 0–200)
Basophils Relative: 0.6 %
Eosinophils Absolute: 112 cells/uL (ref 15–500)
Eosinophils Relative: 1.8 %
HCT: 39.7 % (ref 35.0–45.0)
Hemoglobin: 12.7 g/dL (ref 11.7–15.5)
Lymphs Abs: 1562 cells/uL (ref 850–3900)
MCH: 28.5 pg (ref 27.0–33.0)
MCHC: 32 g/dL (ref 32.0–36.0)
MCV: 89.2 fL (ref 80.0–100.0)
MPV: 10.2 fL (ref 7.5–12.5)
Monocytes Relative: 6.8 %
Neutro Abs: 4067 cells/uL (ref 1500–7800)
Neutrophils Relative %: 65.6 %
Platelets: 271 10*3/uL (ref 140–400)
RBC: 4.45 10*6/uL (ref 3.80–5.10)
RDW: 14.1 % (ref 11.0–15.0)
Total Lymphocyte: 25.2 %
WBC: 6.2 10*3/uL (ref 3.8–10.8)

## 2021-08-20 LAB — VITAMIN D 25 HYDROXY (VIT D DEFICIENCY, FRACTURES): Vit D, 25-Hydroxy: 24 ng/mL — ABNORMAL LOW (ref 30–100)

## 2021-08-20 LAB — LIPID PANEL
Cholesterol: 209 mg/dL — ABNORMAL HIGH (ref <200)
HDL: 77 mg/dL (ref >=50)
LDL Cholesterol (Calc): 116 mg/dL (calc) — ABNORMAL HIGH
Non-HDL Cholesterol (Calc): 132 mg/dL (calc) — ABNORMAL HIGH (ref <130)
Total CHOL/HDL Ratio: 2.7 (calc) (ref <5.0)
Triglycerides: 64 mg/dL (ref <150)

## 2021-08-20 LAB — HEMOGLOBIN A1C
Hgb A1c MFr Bld: 6 % of total Hgb — ABNORMAL HIGH (ref <5.7)
Mean Plasma Glucose: 126 mg/dL
eAG (mmol/L): 7 mmol/L

## 2021-08-20 LAB — TSH: TSH: 4.89 mIU/L — ABNORMAL HIGH

## 2021-08-20 LAB — VITAMIN B12: Vitamin B-12: 236 pg/mL (ref 200–1100)

## 2021-09-01 NOTE — Progress Notes (Signed)
Name: Dana Whitaker   MRN: [id number]    DOB: [birth year]   Date:09/02/2021       Progress Note  Subjective  Chief Complaint  Annual Exam   HPI  Patient presents for annual CPE.  The 10-year ASCVD risk score (Arnett DK, et al., 2019) is: 1.6%   Values used to calculate the score:     Age: 46 years     Sex: Female     Is Non-Hispanic African American: No     Diabetic: No     Tobacco smoker: Yes     Systolic Blood Pressure: 124 mmHg     Is BP treated: No     HDL Cholesterol: 77 mg/dL     Total Cholesterol: 209 mg/dL   Diet: bacon egg and cheese on toast, sometimes oatmeal. Lunch is left overs, dinner at home  Exercise: discussed 150 minutes per week.   Flowsheet Row Office Visit from 09/02/2021 in [facility]  AUDIT-C Score 1      Depression: Phq 9 is  negative Depression screen [facility] 2/9 09/02/2021 08/19/2021 03/05/2021 09/01/2020 02/28/2020  Decreased Interest 0 0 0 0 0  Down, Depressed, Hopeless 0 0 0 0 0  PHQ - 2 Score 0 0 0 0 0  Altered sleeping 0 0 1 2 2   Tired, decreased energy 0 0 3 1 1   Change in appetite 0 0 0 2 1  Feeling bad or failure about yourself  0 0 3 1 0  Trouble concentrating 0 0 1 0 1  Moving slowly or fidgety/restless 0 0 0 0 0  Suicidal thoughts 0 0 0 0 0  PHQ-9 Score 0 0 8 6 5   Difficult doing work/chores - - - - Not difficult at all  Some recent data might be hidden   Hypertension: BP Readings from Last 3 Encounters:  09/02/21 124/82  08/19/21 122/80  03/05/21 132/82   Obesity: Wt Readings from Last 3 Encounters:  09/02/21 (!) 309 lb (140.2 kg)  08/19/21 (!) 309 lb (140.2 kg)  03/05/21 298 lb (135.2 kg)   BMI Readings from Last 3 Encounters:  09/02/21 54.74 kg/m  08/19/21 54.74 kg/m  03/05/21 51.15 kg/m     Vaccines:   HPV: N/A Tdap: up to date Shingrix: N/A Pneumonia: N/A Flu: up to date COVID-19: discussed bivalent booster    Hep C Screening: 02/28/20 STD testing and prevention  (HIV/chl/gon/syphilis): 08/22/17 Intimate partner violence: negative Sexual History : not sexually active in over one year - no intimacy - history of betrayal on both sides  Menstrual History/LMP/Abnormal Bleeding: hysterectomy for treatment of endometrial adenocarcinoma , she sees Dr. Logan Bores Discussed importance of follow up if any post-menopausal bleeding: not applicable Incontinence Symptoms: Yes.    Breast cancer:  - Last Mammogram: 02/2021  - BRCA gene screening: N/A   Osteoporosis Prevention : Discussed high calcium and vitamin D supplementation, weight bearing exercises Bone density :not applicable  Cervical cancer screening: 01/12/21  Skin cancer: Discussed monitoring for atypical lesions  Colorectal cancer: start at age 68  Lung cancer:  Low Dose CT Chest recommended if Age 53-80 years, 20 pack-year currently smoking OR have quit w/in 15years. Patient does not qualify.   ECG: 02/04/16  Advanced Care Planning: A voluntary discussion about advance care planning including the explanation and discussion of advance directives.  Discussed health care proxy and Living will, and the patient was able to identify a health care proxy as husband .  Patient does  not living will or power of attorney of health care   Lipids: Lab Results  Component Value Date   CHOL 209 (H) 08/19/2021   CHOL 204 (H) 09/01/2020   CHOL 204 (H) 02/19/2020   Lab Results  Component Value Date   HDL 77 08/19/2021   HDL 76 09/01/2020   HDL 72 02/19/2020   Lab Results  Component Value Date   LDLCALC 116 (H) 08/19/2021   LDLCALC 115 (H) 09/01/2020   LDLCALC 120 (H) 02/19/2020   Lab Results  Component Value Date   TRIG 64 08/19/2021   TRIG 52 09/01/2020   TRIG 68 02/19/2020   Lab Results  Component Value Date   CHOLHDL 2.7 08/19/2021   CHOLHDL 2.7 09/01/2020   CHOLHDL 2.8 02/19/2020   No results found for: LDLDIRECT  Glucose: Glucose  Date Value Ref Range Status  02/19/2020 88 65 - 99 mg/dL Final   16/04/9603 540 (H) 65 - 99 mg/dL Final  98/05/9146 95 65 - 99 mg/dL Final   Glucose, Bld  Date Value Ref Range Status  08/19/2021 97 65 - 99 mg/dL Final    Comment:    .            Fasting reference interval .   09/01/2020 95 65 - 99 mg/dL Final    Comment:    .            Fasting reference interval .   11/16/2018 88 65 - 99 mg/dL Final    Comment:    .            Fasting reference interval .    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   Fatty liver 06/13/2016   Bilateral carpal tunnel syndrome 06/13/2016   Status post bariatric surgery 06/13/2016   Hypertriglyceridemia 05/19/2016   History of fracture of left ankle 05/19/2016   Former cigarette smoker 02/17/2016   Prediabetes 02/17/2016   Status post laparoscopic hysterectomy 11/10/2015   Morbid obesity with BMI of 50.0-59.9, adult (HCC) 11/10/2015   Borderline personality disorder (HCC) 01/13/2015   ADHD (attention deficit hyperactivity disorder), combined type 01/13/2015   Apnea, sleep 01/13/2015   Insomnia, persistent 01/13/2015   Depression, major, recurrent, moderate (HCC) 01/13/2015   Fibromyalgia 01/13/2015   Generalized anxiety disorder 01/13/2015   Classical migraine with intractable migraine 09/12/2014   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    Past Surgical History:  Procedure Laterality Date   ABDOMINAL HYSTERECTOMY     [place].bso   CARPAL TUNNEL RELEASE     pt states did not have surgery just injections    LAPAROSCOPIC GASTRIC  SLEEVE RESECTION N/A 06/13/2016   Procedure: LAPAROSCOPIC GASTRIC SLEEVE RESECTION WITH HIATAL HERNIA REPAIR AND UPPER ENDOSCOPY;  Surgeon: Gaynelle Adu, MD;  Location: [facility];  Service: General;  Laterality: N/A;   LEEP     TONSILLECTOMY     VARICOSE VEIN SURGERY      Family History  Problem Relation Age of Onset   Hyperlipidemia  Mother    Bipolar disorder Mother    Heart attack Father    Drug abuse Father    Anxiety disorder Sister    Depression Sister    Anxiety disorder Sister    Depression Sister    ADD / ADHD Sister    Alcohol abuse Sister    Thyroid disease Maternal Grandmother    Diabetes Maternal Grandmother    Stroke Maternal Grandmother    Ovarian cancer Neg Hx    Breast cancer Neg Hx    Colon cancer Neg Hx     Social History   Socioeconomic History   Marital status: Married    Spouse name: Not on file   Number of children: 1   Years of education: Not on file   Highest education level: Associate degree: academic program  Occupational History   Occupation: accountant  Tobacco Use   Smoking status: Former    Packs/day: 1.00    Years: 23.00    Pack years: 23.00    Types: Cigarettes    Start date: 02/05/1994    Quit date: 05/10/2016    Years since quitting: 5.3   Smokeless tobacco: Never  Vaping Use   Vaping Use: Never used  Substance and Sexual Activity   Alcohol use: Yes    Alcohol/week: 0.0 standard drinks    Comment: occas   Drug use: No   Sexual activity: Yes    Birth control/protection: Surgical  Other Topics Concern   Not on file  Social History Narrative   Married,    Has a grown son, raising step-daughter, also fostering/raising another girl   Going to school, trying to finish her bachelor in business administration    Social Determinants of Health   Financial Resource Strain: Low Risk    Difficulty of Paying Living Expenses: Not very hard  Food Insecurity: No Food Insecurity   Worried About Programme researcher, broadcasting/film/video in the Last Year: Never  true   Barista in the Last Year: Never true  Transportation Needs: No Transportation Needs   Lack of Transportation (Medical): No   Lack of Transportation (Non-Medical): No  Physical Activity: Inactive   Days of Exercise per Week: 0 days   Minutes of Exercise per Session: 0 min  Stress: Stress Concern Present   Feeling of Stress : Rather much  Social Connections: Socially Integrated   Frequency of Communication with Friends and Family: More than three times a week   Frequency of Social Gatherings with Friends and Family: Twice a week   Attends Religious Services: More than 4 times per year   Active Member of [employer] or Organizations: Yes   Attends Engineer, structural: More than 4 times per year   Marital Status: Married  Catering manager Violence: Not At Risk   Fear of Current or Ex-Partner: No   Emotionally Abused: No   Physically Abused: No   Sexually Abused: No     Current Outpatient Medications:    Cholecalciferol (VITAMIN D) 50 MCG (2000 UT) CAPS, Take 1 capsule (2,000 Units total) by mouth daily., Disp: 30 capsule, Rfl: 0   Cyanocobalamin (VITAMIN B-12) 1000 MCG SUBL, Place 1 tablet (1,000 mcg total) under the tongue daily., Disp: 100 tablet, Rfl: 0   levothyroxine (SYNTHROID) 75 MCG tablet, Take 1 tablet (75 mcg total) by mouth daily. And skip once a week, Disp: 90 tablet, Rfl: 1   pantoprazole (PROTONIX) 40 MG tablet, Take 1 tablet (40 mg total) by mouth daily.,  Disp: 90 tablet, Rfl: 1   modafinil (PROVIGIL) 100 MG tablet, Take 1 tablet (100 mg total) by mouth daily. (Patient not taking: Reported on 09/02/2021), Disp: 30 tablet, Rfl: 2   Semaglutide (RYBELSUS) 7 MG TABS, Take 7 mg by mouth daily. (Patient not taking: Reported on 09/02/2021), Disp: 90 tablet, Rfl: 0  Current Facility-Administered Medications:    cyanocobalamin ((VITAMIN B-12)) injection 1,000 mcg, 1,000 mcg, Intramuscular, Once, Ashleigh Luckow, Danna Hefty, MD  Allergies  Allergen Reactions    Ciprofloxacin Hives   Tramadol Rash     ROS  Constitutional: Negative for fever or weight change.  Respiratory: Negative for cough and shortness of breath.   Cardiovascular: Negative for chest pain or palpitations.  Gastrointestinal: Negative for abdominal pain, no bowel changes.  Musculoskeletal: Negative for gait problem or joint swelling.  Skin: Negative for rash.  Neurological: Negative for dizziness or headache.  No other specific complaints in a complete review of systems (except as listed in HPI above).   Objective  Vitals:   09/02/21 0755  BP: 124/82  Pulse: 88  Resp: 16  SpO2: 98%  Weight: (!) 309 lb (140.2 kg)  Height: 5\' 3"  (1.6 m)    Body mass index is 54.74 kg/m.  Physical Exam  Constitutional: Patient appears well-developed and well-nourished. No distress.  HENT: Head: Normocephalic and atraumatic. Ears: B TMs ok, no erythema or effusion; Nose: Nose normal. Mouth/Throat: Oropharynx is clear and moist. No oropharyngeal exudate.  Eyes: Conjunctivae and EOM are normal. Pupils are equal, round, and reactive to light. No scleral icterus.  Neck: Normal range of motion. Neck supple. No JVD present. No thyromegaly present.  Cardiovascular: Normal rate, regular rhythm and normal heart sounds.  No murmur heard. No BLE edema. Pulmonary/Chest: Effort normal and breath sounds normal. No respiratory distress. Abdominal: Soft. Bowel sounds are normal, no distension. There is no tenderness. no masses Breast: no lumps or masses, no nipple discharge or rashes FEMALE GENITALIA:  Not done  RECTAL: not done  Musculoskeletal: Normal range of motion, no joint effusions. No gross deformities Neurological: he is alert and oriented to person, place, and time. No cranial nerve deficit. Coordination, balance, strength, speech and gait are normal.  Skin: Skin is warm and dry. No rash noted. No erythema.  Psychiatric: Patient has a normal mood and affect. behavior is normal. Judgment  and thought content normal.   Recent Results (from the past 2160 hour(s))  Lipid panel     Status: Abnormal   Collection Time: 08/19/21  8:30 AM  Result Value Ref Range   Cholesterol 209 (H) <200 mg/dL   HDL 77 > OR = 50 mg/dL   Triglycerides 64 <161 mg/dL   LDL Cholesterol (Calc) 116 (H) mg/dL (calc)    Comment: Reference range: <100 . Desirable range <100 mg/dL for primary prevention;   <70 mg/dL for patients with CHD or diabetic patients  with > or = 2 CHD risk factors. Marland Kitchen LDL-C is now calculated using the Martin-Hopkins  calculation, which is a validated novel method providing  better accuracy than the Friedewald equation in the  estimation of LDL-C.  Horald Pollen et al. Lenox Ahr. 0960;454(09): 2061-2068  (http://education.[facility].com/faq/FAQ164)    Total CHOL/HDL Ratio 2.7 <5.0 (calc)   Non-HDL Cholesterol (Calc) 132 (H) <130 mg/dL (calc)    Comment: For patients with diabetes plus 1 major ASCVD risk  factor, treating to a non-HDL-C goal of <100 mg/dL  (LDL-C of <81 mg/dL) is considered a therapeutic  option.   CBC with  Differential/Platelet     Status: None   Collection Time: 08/19/21  8:30 AM  Result Value Ref Range   WBC 6.2 3.8 - 10.8 Thousand/uL   RBC 4.45 3.80 - 5.10 Million/uL   Hemoglobin 12.7 11.7 - 15.5 g/dL   HCT 16.1 09.6 - 04.5 %   MCV 89.2 80.0 - 100.0 fL   MCH 28.5 27.0 - 33.0 pg   MCHC 32.0 32.0 - 36.0 g/dL   RDW 40.9 81.1 - 91.4 %   Platelets 271 140 - 400 Thousand/uL   MPV 10.2 7.5 - 12.5 fL   Neutro Abs 4,067 1,500 - 7,800 cells/uL   Lymphs Abs 1,562 850 - 3,900 cells/uL   Absolute Monocytes 422 200 - 950 cells/uL   Eosinophils Absolute 112 15 - 500 cells/uL   Basophils Absolute 37 0 - 200 cells/uL   Neutrophils Relative % 65.6 %   Total Lymphocyte 25.2 %   Monocytes Relative 6.8 %   Eosinophils Relative 1.8 %   Basophils Relative 0.6 %  COMPLETE METABOLIC PANEL WITH GFR     Status: None   Collection Time: 08/19/21  8:30 AM  Result Value  Ref Range   Glucose, Bld 97 65 - 99 mg/dL    Comment: .            Fasting reference interval .    BUN 16 7 - 25 mg/dL   Creat 7.82 9.56 - 2.13 mg/dL   eGFR 96 > OR = 60 YQ/MVH/8.46N6    Comment: The eGFR is based on the CKD-EPI 2021 equation. To calculate  the new eGFR from a previous Creatinine or Cystatin C result, go to https://www.kidney.org/professionals/ kdoqi/gfr%5Fcalculator    BUN/Creatinine Ratio NOT APPLICABLE 6 - 22 (calc)   Sodium 141 135 - 146 mmol/L   Potassium 4.5 3.5 - 5.3 mmol/L   Chloride 105 98 - 110 mmol/L   CO2 27 20 - 32 mmol/L   Calcium 9.3 8.6 - 10.2 mg/dL   Total Protein 7.1 6.1 - 8.1 g/dL   Albumin 4.2 3.6 - 5.1 g/dL   Globulin 2.9 1.9 - 3.7 g/dL (calc)   AG Ratio 1.4 1.0 - 2.5 (calc)   Total Bilirubin 0.5 0.2 - 1.2 mg/dL   Alkaline phosphatase (APISO) 66 31 - 125 U/L   AST 13 10 - 30 U/L   ALT 19 6 - 29 U/L  TSH     Status: Abnormal   Collection Time: 08/19/21  8:30 AM  Result Value Ref Range   TSH 4.89 (H) mIU/L    Comment:           Reference Range .           > or = 20 Years  0.40-4.50 .                Pregnancy Ranges           First trimester    0.26-2.66           Second trimester   0.55-2.73           Third trimester    0.43-2.91   Vitamin B12     Status: None   Collection Time: 08/19/21  8:30 AM  Result Value Ref Range   Vitamin B-12 236 200 - 1,100 pg/mL    Comment: . Please Note: Although the reference range for vitamin B12 is [id number] pg/mL, it has been reported that between 5 and 10% of patients with values between 200 and  400 pg/mL may experience neuropsychiatric and hematologic abnormalities due to occult B12 deficiency; less than 1% of patients with values above 400 pg/mL will have symptoms. Marland Kitchen   VITAMIN D 25 Hydroxy (Vit-D Deficiency, Fractures)     Status: Abnormal   Collection Time: 08/19/21  8:30 AM  Result Value Ref Range   Vit D, 25-Hydroxy 24 (L) 30 - 100 ng/mL    Comment: Vitamin D Status         25-OH  Vitamin D: . Deficiency:                    <20 ng/mL Insufficiency:             20 - 29 ng/mL Optimal:                 > or = 30 ng/mL . For 25-OH Vitamin D testing on patients on  D2-supplementation and patients for whom quantitation  of D2 and D3 fractions is required, the [facility](TM) 25-OH VIT D, (D2,D3), LC/MS/MS is recommended: order  code 16109 (patients >65yrs). See Note 1 . Note 1 . For additional information, please refer to  http://education.[facility].com/faq/FAQ199  (This link is being provided for informational/ educational purposes only.)   Hemoglobin A1c     Status: Abnormal   Collection Time: 08/19/21  8:30 AM  Result Value Ref Range   Hgb A1c MFr Bld 6.0 (H) <5.7 % of total Hgb    Comment: For someone without known diabetes, a hemoglobin  A1c value between 5.7% and 6.4% is consistent with prediabetes and should be confirmed with a  follow-up test. . For someone with known diabetes, a value <7% indicates that their diabetes is well controlled. A1c targets should be individualized based on duration of diabetes, age, comorbid conditions, and other considerations. . This assay result is consistent with an increased risk of diabetes. . Currently, no consensus exists regarding use of hemoglobin A1c for diagnosis of diabetes for children. .    Mean Plasma Glucose 126 mg/dL   eAG (mmol/L) 7.0 mmol/L     Fall Risk: Fall Risk  09/02/2021 08/19/2021 03/05/2021 09/01/2020 02/28/2020  Falls in the past year? 0 0 0 0 0  Number falls in past yr: 0 0 0 0 0  Injury with Fall? 0 0 0 0 0  Comment - - - - -  Risk Factor Category  - - - - -  Risk for fall due to : No Fall Risks No Fall Risks No Fall Risks - -  Risk for fall due to: Comment - - - - -  Follow up Falls prevention discussed Falls prevention discussed Falls prevention discussed - -     Functional Status Survey: Is the patient deaf or have difficulty hearing?: No Does the patient have difficulty  seeing, even when wearing glasses/contacts?: No Does the patient have difficulty concentrating, remembering, or making decisions?: No Does the patient have difficulty walking or climbing stairs?: No Does the patient have difficulty dressing or bathing?: No Does the patient have difficulty doing errands alone such as visiting a doctor's office or shopping?: No   Assessment & Plan  1. Well adult exam   2. Vitamin B12 deficiency  - cyanocobalamin ((VITAMIN B-12)) injection 1,000 mcg  3. Colon cancer screening  - Cologuard   -USPSTF grade A and B recommendations reviewed with patient; age-appropriate recommendations, preventive care, screening tests, etc discussed and encouraged; healthy living encouraged; see AVS for patient education given to patient -Discussed  importance of 150 minutes of physical activity weekly, eat two servings of fish weekly, eat one serving of tree nuts ( cashews, pistachios, pecans, almonds.Marland Kitchen) every other day, eat 6 servings of fruit/vegetables daily and drink plenty of water and avoid sweet beverages.   -Reviewed Health Maintenance: Yes.

## 2021-09-02 ENCOUNTER — Encounter: Payer: Self-pay | Admitting: Family Medicine

## 2021-09-02 ENCOUNTER — Other Ambulatory Visit: Payer: Self-pay

## 2021-09-02 ENCOUNTER — Ambulatory Visit (INDEPENDENT_AMBULATORY_CARE_PROVIDER_SITE_OTHER): Payer: BC Managed Care – PPO | Admitting: Family Medicine

## 2021-09-02 VITALS — BP 124/82 | HR 88 | Resp 16 | Ht 63.0 in | Wt 309.0 lb

## 2021-09-02 DIAGNOSIS — E538 Deficiency of other specified B group vitamins: Secondary | ICD-10-CM | POA: Diagnosis not present

## 2021-09-02 DIAGNOSIS — Z1211 Encounter for screening for malignant neoplasm of colon: Secondary | ICD-10-CM | POA: Diagnosis not present

## 2021-09-02 DIAGNOSIS — Z0000 Encounter for general adult medical examination without abnormal findings: Secondary | ICD-10-CM

## 2021-09-02 MED ORDER — CYANOCOBALAMIN 1000 MCG/ML IJ SOLN
1000.0000 ug | Freq: Once | INTRAMUSCULAR | Status: AC
  Administered 2021-09-02: 1000 ug via INTRAMUSCULAR

## 2021-09-02 NOTE — Patient Instructions (Signed)

## 2021-11-03 IMAGING — MG MM DIGITAL SCREENING BILAT W/ TOMO AND CAD
8 of 14 series · 8 of 40 positions shown · non-contrast
Comparison: Previous exam(s).

ACR Breast Density Category a: The breast tissue is almost entirely
fatty.

CLINICAL DATA: Screening.

EXAM:
DIGITAL SCREENING BILATERAL MAMMOGRAM WITH TOMOSYNTHESIS AND CAD
TECHNIQUE: Bilateral screening digital craniocaudal and mediolateral oblique
mammograms were obtained. Bilateral screening digital breast
tomosynthesis was performed. The images were evaluated with
computer-aided detection.

[R MLO synth-2D (1 of 2)]
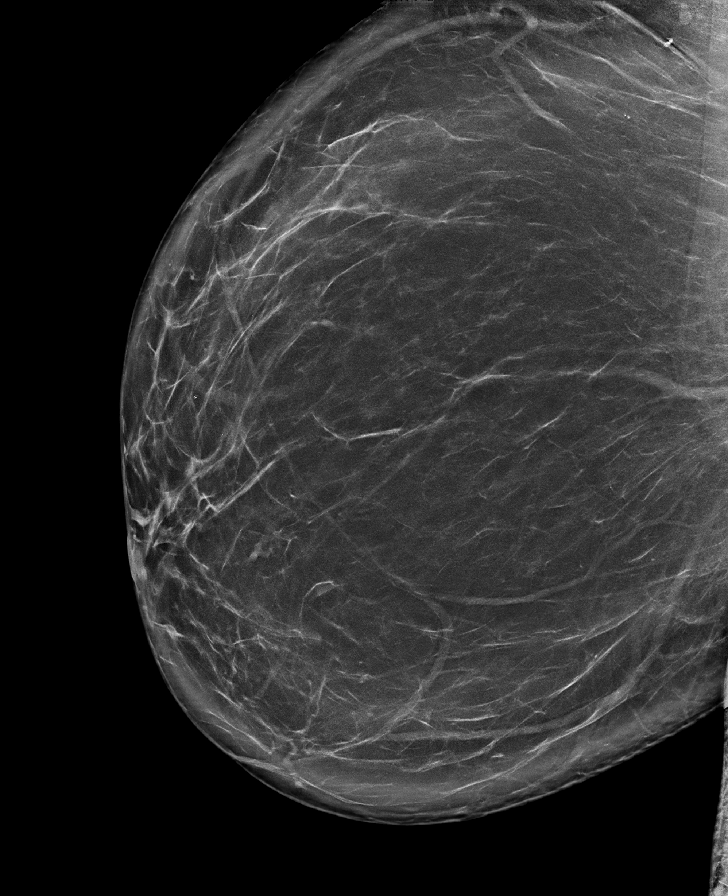

[L MLO synth-2D (1 of 2)]
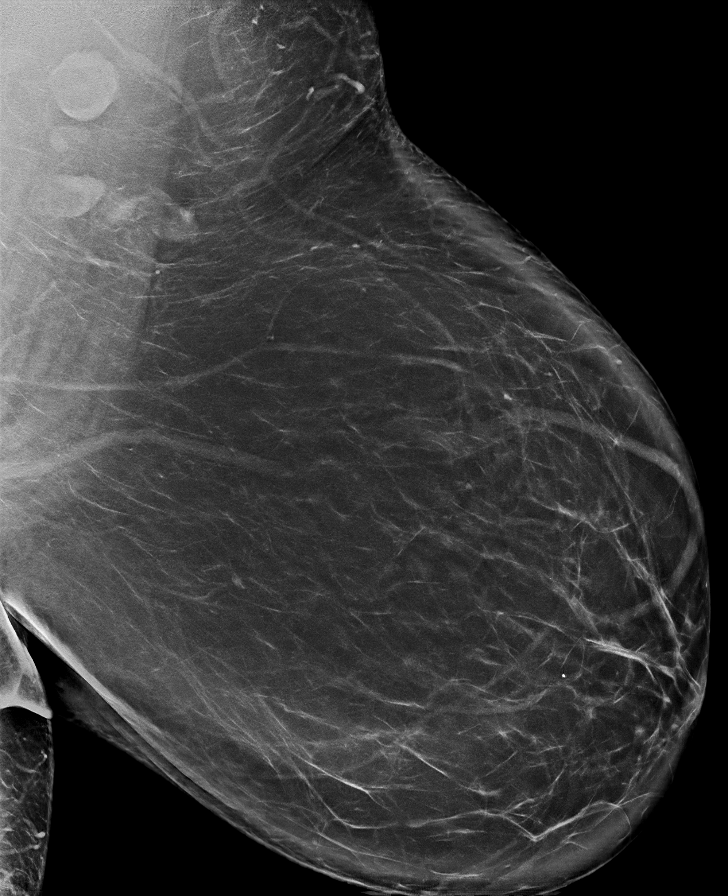

[L CC synth-2D]
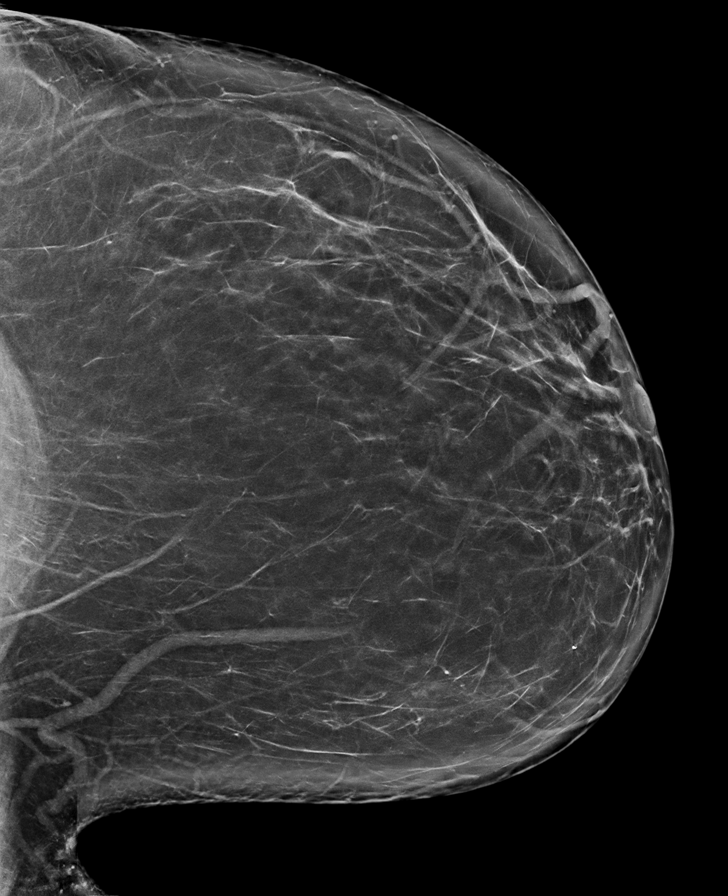

[L MLO synth-2D (2 of 2)]
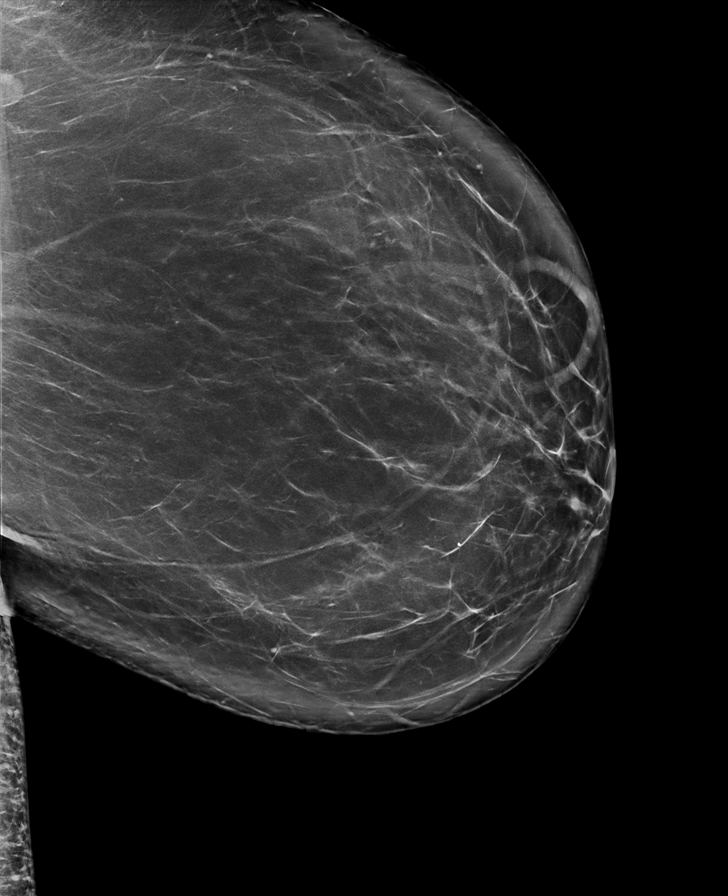

[R XCCL synth-2D]
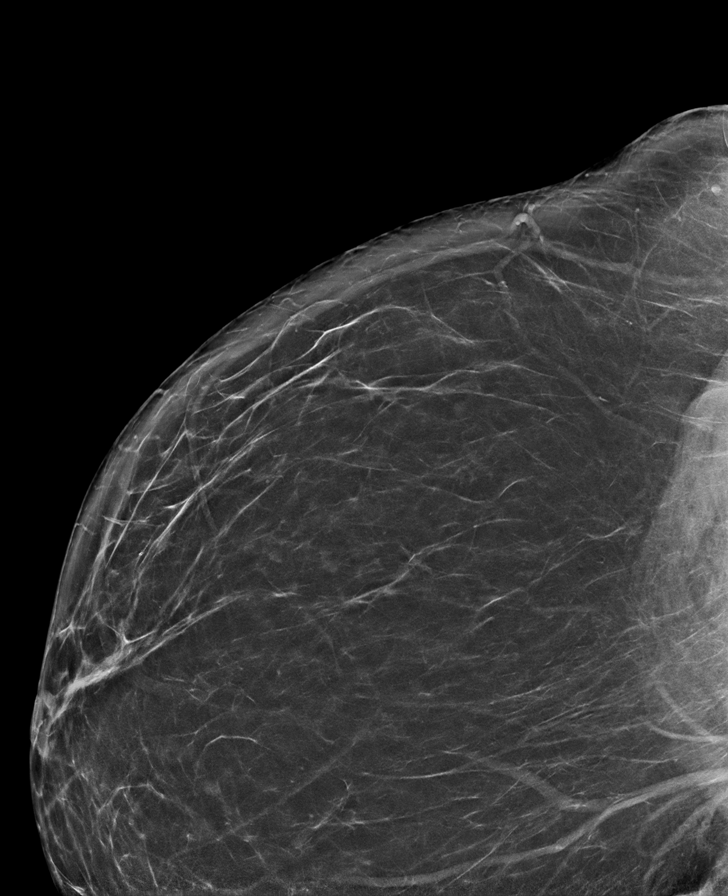

[R MLO synth-2D (2 of 2)]
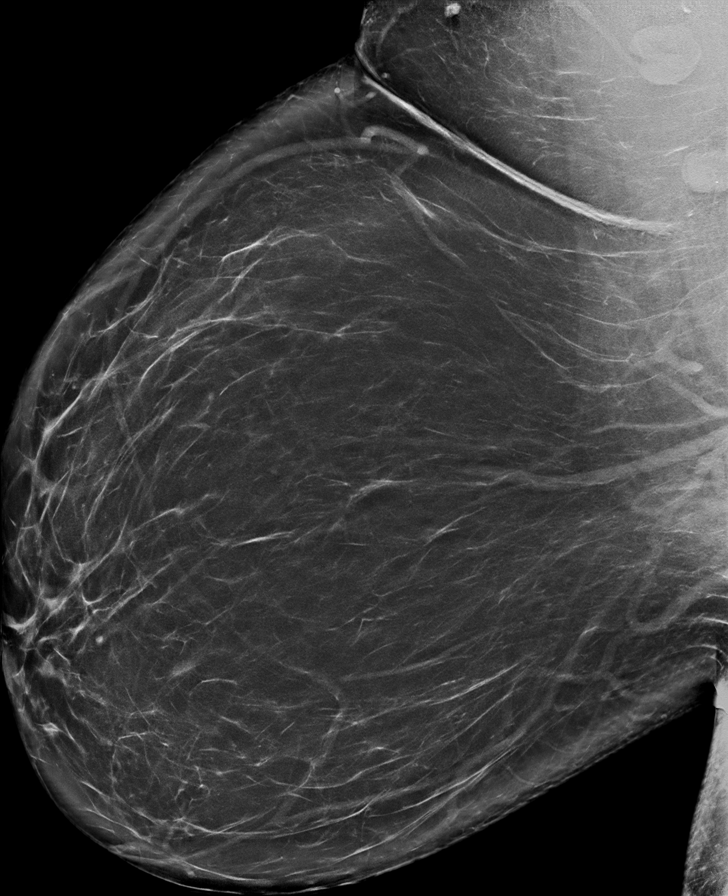

[R CC synth-2D]
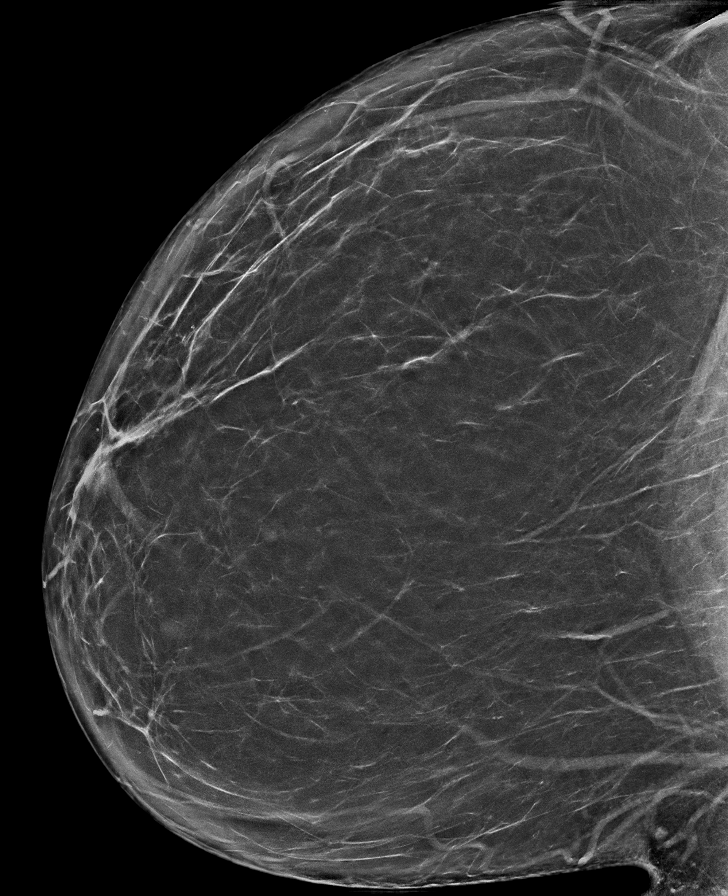

[R MLO tomo · tomo slice 55/108.0]
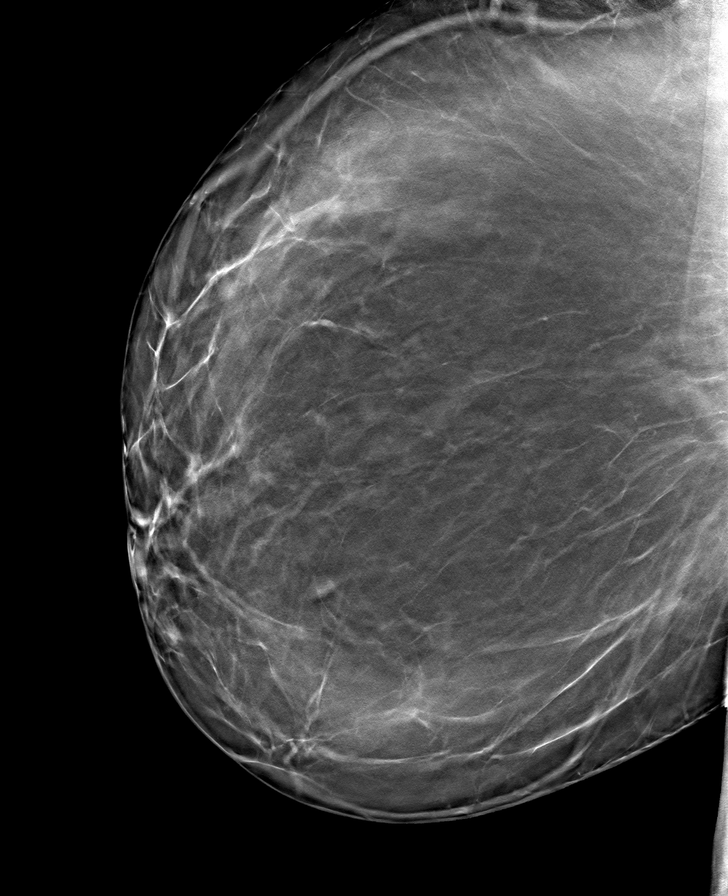

[8 of 40 positions shown; findings below may reference images not displayed]

FINDINGS: There are no findings suspicious for malignancy.
IMPRESSION: No mammographic evidence of malignancy. A result letter of this
screening mammogram will be mailed directly to the patient.

RECOMMENDATION:
Screening mammogram in one year. (Code:0E-3-N98)

BI-RADS CATEGORY  1: Negative.

## 2021-11-12 ENCOUNTER — Encounter: Payer: Self-pay | Admitting: Family Medicine

## 2021-11-19 ENCOUNTER — Ambulatory Visit: Payer: BC Managed Care – PPO | Admitting: Family Medicine

## 2021-11-19 ENCOUNTER — Ambulatory Visit (INDEPENDENT_AMBULATORY_CARE_PROVIDER_SITE_OTHER): Payer: BC Managed Care – PPO | Admitting: Family Medicine

## 2021-11-19 ENCOUNTER — Encounter: Payer: Self-pay | Admitting: Family Medicine

## 2021-11-19 VITALS — BP 138/84 | HR 95 | Temp 98.2°F | Resp 18 | Ht 64.0 in | Wt 306.4 lb

## 2021-11-19 DIAGNOSIS — F603 Borderline personality disorder: Secondary | ICD-10-CM

## 2021-11-19 DIAGNOSIS — K219 Gastro-esophageal reflux disease without esophagitis: Secondary | ICD-10-CM | POA: Diagnosis not present

## 2021-11-19 DIAGNOSIS — R7303 Prediabetes: Secondary | ICD-10-CM

## 2021-11-19 DIAGNOSIS — F900 Attention-deficit hyperactivity disorder, predominantly inattentive type: Secondary | ICD-10-CM

## 2021-11-19 DIAGNOSIS — E559 Vitamin D deficiency, unspecified: Secondary | ICD-10-CM

## 2021-11-19 DIAGNOSIS — E785 Hyperlipidemia, unspecified: Secondary | ICD-10-CM

## 2021-11-19 DIAGNOSIS — E538 Deficiency of other specified B group vitamins: Secondary | ICD-10-CM | POA: Diagnosis not present

## 2021-11-19 DIAGNOSIS — E039 Hypothyroidism, unspecified: Secondary | ICD-10-CM

## 2021-11-19 DIAGNOSIS — F330 Major depressive disorder, recurrent, mild: Secondary | ICD-10-CM | POA: Diagnosis not present

## 2021-11-19 DIAGNOSIS — Z6841 Body Mass Index (BMI) 40.0 and over, adult: Secondary | ICD-10-CM

## 2021-11-19 DIAGNOSIS — Z9884 Bariatric surgery status: Secondary | ICD-10-CM

## 2021-11-19 MED ORDER — CYANOCOBALAMIN 1000 MCG/ML IJ SOLN
1000.0000 ug | Freq: Once | INTRAMUSCULAR | Status: AC
  Administered 2021-11-19: 1000 ug via INTRAMUSCULAR

## 2021-11-19 MED ORDER — WEGOVY 0.25 MG/0.5ML ~~LOC~~ SOAJ: 0.2500 mg | SUBCUTANEOUS | 0 refills | Status: DC

## 2021-11-19 NOTE — Assessment & Plan Note (Signed)
Starting [facility] - weight 306 lbs

## 2021-11-19 NOTE — Progress Notes (Signed)
Name: Dana Whitaker   MRN: [id number]    DOB: [birth year]   Date:11/19/2021       Progress Note  Subjective  Chief Complaint  Medication Management  HPI  Dyslipidemia: LDL elevated but low risk of heart attacks or strokes Unchanged   The 10-year ASCVD risk score (Arnett DK, et al., 2019) is: 1.9%   Values used to calculate the score:     Age: 46 years     Sex: Female     Is Non-Hispanic African American: No     Diabetic: No     Tobacco smoker: Yes     Systolic Blood Pressure: 132 mmHg     Is BP treated: No     HDL Cholesterol: 77 mg/dL     Total Cholesterol: 209 mg/dL    Hypothyroidism: she was taking levothyroxine 75  mcg daily . Denies dysphagia. Last visit was elevated and we added a half a pill a week.  However she is now taking care of another child - an infant boy and has been forgetting to take medication. Advised to take medication and return in one month for labs    Pre-diabetes: she states she has polyphagia, polydipsia but no polyuria. Last A1C was 5.8 % . A1C was higher prior to her bariatric surgery in 2017 - it was 6.3 % since surgery her highest A1C was 6 % . Rybelsus not covered by insurance.    Morbid obesity: she had bariatric surgery Dec 2017, her weight was 298 lbs prior to surgery, she went as low as 230 lbs, but gradually her weight went up , she was stable around 270 lbs for about one year while on Keto diet. She saw Dr. Redmond Pulling and was  taking phentermine but she stopped taking because it was causing headaches and gained weight right back when she stopped taking it.Minette Headland was too expensive. We tried Rybelsus but not covered by insurance. She states insurance pays for [facility] and she would like to try it    History of LGSIL and endometrium dysplasia, history of hysterectomy, under the care of Dr. Amalia Hailey, had a CPE July 2021 , repeat pap smear 2022 was also normal she states gyn is going to do pap smears every 2 years if also normal in 2023  Unchanged     Major depression/GAD/Borderline personality disorder : she was diagnosed by psychiatrist years ago.  She states she is doing well emotionally, and normal phq 9, she stopped adderal on her own, caused dry mouth and felt anxious She does not like taking medications    ADHD: son also has ADHD, diagnosed by Dr. Leonides Schanz, she took Ritalin in the past. 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 Unchanged    GERD: she has been taking Pantoprazole and denies side effects, regurgitation heartburn and indigestion resolved with medication. Unchanged      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/2016   Fatty liver 06/13/2016  Bilateral carpal tunnel syndrome 06/13/2016   Status post bariatric surgery 06/13/2016   Hypertriglyceridemia 05/19/2016   History of fracture of left ankle 05/19/2016   Former cigarette smoker 02/17/2016   Prediabetes 02/17/2016   Status post laparoscopic hysterectomy 11/10/2015   Morbid obesity with BMI of 50.0-59.9, adult ([place]) 11/10/2015   Borderline personality disorder ([place]) 01/13/2015   ADHD (attention deficit hyperactivity disorder), combined type 01/13/2015   Apnea, sleep 01/13/2015   Insomnia, persistent 01/13/2015   Depression, major, recurrent, moderate ([place]) 01/13/2015   Fibromyalgia 01/13/2015   Generalized anxiety disorder 01/13/2015   Classical migraine with intractable migraine 09/12/2014   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    Past Surgical History:   Procedure Laterality Date   ABDOMINAL HYSTERECTOMY     tah.bso   CARPAL TUNNEL RELEASE     pt states did not have surgery just injections    LAPAROSCOPIC GASTRIC SLEEVE RESECTION N/A 06/13/2016   Procedure: LAPAROSCOPIC GASTRIC SLEEVE RESECTION WITH HIATAL HERNIA REPAIR AND UPPER ENDOSCOPY;  Surgeon: Greer Pickerel, MD;  Location: [facility];  Service: General;  Laterality: N/A;   LEEP     TONSILLECTOMY     VARICOSE VEIN SURGERY      Family History  Problem Relation Age of Onset   Hyperlipidemia Mother    Bipolar disorder Mother    Heart attack Father    Drug abuse Father    Anxiety disorder Sister    Depression Sister    Anxiety disorder Sister    Depression Sister    ADD / ADHD Sister    Alcohol abuse Sister    Thyroid disease Maternal Grandmother    Diabetes Maternal Grandmother    Stroke Maternal Grandmother    Ovarian cancer Neg Hx    Breast cancer Neg Hx    Colon cancer Neg Hx     Social History   Tobacco Use   Smoking status: Former    Packs/day: 1.00    Years: 23.00    Pack years: 23.00    Types: Cigarettes    Start date: 02/05/1994    Quit date: 05/10/2016    Years since quitting: 5.5   Smokeless tobacco: Never  Substance Use Topics   Alcohol use: Yes    Alcohol/week: 0.0 standard drinks    Comment: occas     Current Outpatient Medications:    Cholecalciferol (VITAMIN D) 50 MCG (2000 UT) CAPS, Take 1 capsule (2,000 Units total) by mouth daily., Disp: 30 capsule, Rfl: 0   levothyroxine (SYNTHROID) 75 MCG tablet, Take 1 tablet (75 mcg total) by mouth daily. And skip once a week, Disp: 90 tablet, Rfl: 1   pantoprazole (PROTONIX) 40 MG tablet, Take 1 tablet (40 mg total) by mouth daily., Disp: 90 tablet, Rfl: 1   Semaglutide-Weight Management (WEGOVY) 0.25 MG/0.5ML SOAJ, Inject 0.25 mg into the skin once a week., Disp: 2 mL, Rfl: 0  Current Facility-Administered Medications:    cyanocobalamin ((VITAMIN B-12)) injection 1,000 mcg, 1,000 mcg, Intramuscular, Once,  Steele Sizer, MD  Allergies  Allergen Reactions   Ciprofloxacin Hives   Tramadol Rash    I personally reviewed active problem list, medication list, allergies, family history, social history, health maintenance with the patient/caregiver today.   ROS  Constitutional: Negative for fever or weight change.  Respiratory: Negative for cough and shortness of breath.   Cardiovascular: Negative for chest pain or palpitations.  Gastrointestinal: Negative for abdominal pain, no bowel changes.  Musculoskeletal: Negative for gait problem or joint swelling.  Skin: Negative  for rash.  Neurological: Negative for dizziness or headache.  No other specific complaints in a complete review of systems (except as listed in HPI above).   Objective  Vitals:   11/19/21 1023  BP: 138/84  Pulse: 95  Resp: 18  Temp: 98.2 F (36.8 C)  TempSrc: Oral  SpO2: 97%  Weight: (!) 306 lb 6.4 oz (139 kg)  Height: '5\' 4"'$  (1.626 m)    Body mass index is 52.59 kg/m.  Physical Exam  Constitutional: Patient appears well-developed and well-nourished. Obese  No distress.  HEENT: head atraumatic, normocephalic, pupils equal and reactive to light,, neck supple Cardiovascular: Normal rate, regular rhythm and normal heart sounds.  No murmur heard. Non pitting BLE edema. Pulmonary/Chest: Effort normal and breath sounds normal. No respiratory distress. Abdominal: Soft.  There is no tenderness. Psychiatric: Patient has a normal mood and affect. behavior is normal. Judgment and thought content normal.   PHQ2/9:    11/19/2021   10:23 AM 09/02/2021    7:55 AM 08/19/2021    7:38 AM 03/05/2021    7:42 AM 09/01/2020    7:50 AM  Depression screen PHQ 2/9  Decreased Interest 1 0 0 0 0  Down, Depressed, Hopeless 0 0 0 0 0  PHQ - 2 Score 1 0 0 0 0  Altered sleeping 0 0 0 1 2  Tired, decreased energy 3 0 0 3 1  Change in appetite 3 0 0 0 2  Feeling bad or failure about yourself  2 0 0 3 1  Trouble concentrating 0 0 0 1 0   Moving slowly or fidgety/restless 0 0 0 0 0  Suicidal thoughts 0 0 0 0 0  PHQ-9 Score 9 0 0 8 6  Difficult doing work/chores Not difficult at all        phq 9 is negative   Fall Risk:    11/19/2021   10:23 AM 09/02/2021    7:55 AM 08/19/2021    7:38 AM 03/05/2021    7:42 AM 09/01/2020    7:50 AM  Fall Risk   Falls in the past year? 0 0 0 0 0  Number falls in past yr:  0 0 0 0  Injury with Fall?  0 0 0 0  Risk for fall due to : No Fall Risks No Fall Risks No Fall Risks No Fall Risks   Follow up Falls prevention discussed Falls prevention discussed Falls prevention discussed Falls prevention discussed       Assessment & Plan  Problem List Items Addressed This Visit     Borderline personality disorder ([place])    Seen by psychiatrist in the past but not currently Not taking any medications       Morbid obesity with BMI of 50.0-59.9, adult ([place])    Starting JIRCVE - weight 306 lbs       Relevant Medications   Semaglutide-Weight Management (WEGOVY) 0.25 MG/0.5ML SOAJ   Prediabetes    Discussed labs       Status post bariatric surgery    Having B12 deficiency Gained weight back Starting Wegovy today weight of 306 lbs       Hypothyroidism    Discussed importance of compliance and return in one month for labs       GERD without esophagitis   Other Visit Diagnoses     Mild recurrent major depression ([place])    -  Primary   Morbid obesity with body mass index (BMI) of 50.0 to 59.9 in  adult [facility])       Relevant Medications   Semaglutide-Weight Management (WEGOVY) 0.25 MG/0.5ML SOAJ   Vitamin B12 deficiency       Relevant Medications   cyanocobalamin ((VITAMIN B-12)) injection 1,000 mcg (Completed)   Vitamin D deficiency       Dyslipidemia       Hypothyroidism (acquired)       Relevant Orders   TSH   Attention deficit hyperactivity disorder (ADHD), predominantly inattentive type          .

## 2021-11-19 NOTE — Assessment & Plan Note (Signed)
Seen by psychiatrist in the past but not currently Not taking any medications

## 2021-11-19 NOTE — Assessment & Plan Note (Signed)
Discussed labs

## 2021-11-19 NOTE — Assessment & Plan Note (Addendum)
Having B12 deficiency Gained weight back Starting [facility] today weight of 306 lbs

## 2021-11-19 NOTE — Assessment & Plan Note (Signed)
Discussed importance of compliance and return in one month for labs

## 2021-11-27 ENCOUNTER — Encounter: Payer: Self-pay | Admitting: Family Medicine

## 2021-12-20 ENCOUNTER — Ambulatory Visit (INDEPENDENT_AMBULATORY_CARE_PROVIDER_SITE_OTHER): Payer: BC Managed Care – PPO

## 2021-12-20 DIAGNOSIS — E538 Deficiency of other specified B group vitamins: Secondary | ICD-10-CM

## 2021-12-20 MED ORDER — CYANOCOBALAMIN 1000 MCG/ML IJ SOLN
1000.0000 ug | Freq: Once | INTRAMUSCULAR | Status: AC
  Administered 2021-12-20: 1000 ug via INTRAMUSCULAR

## 2022-01-07 ENCOUNTER — Other Ambulatory Visit: Payer: Self-pay | Admitting: Family Medicine

## 2022-01-07 ENCOUNTER — Encounter (HOSPITAL_COMMUNITY): Payer: Self-pay | Admitting: *Deleted

## 2022-01-07 DIAGNOSIS — Z1231 Encounter for screening mammogram for malignant neoplasm of breast: Secondary | ICD-10-CM

## 2022-01-10 ENCOUNTER — Ambulatory Visit: Payer: BC Managed Care – PPO | Admitting: Family Medicine

## 2022-01-10 ENCOUNTER — Encounter: Payer: Self-pay | Admitting: Family Medicine

## 2022-01-10 VITALS — BP 128/72 | HR 81 | Temp 98.1°F | Resp 20 | Ht 64.0 in | Wt 311.7 lb

## 2022-01-10 DIAGNOSIS — L03818 Cellulitis of other sites: Secondary | ICD-10-CM

## 2022-01-10 MED ORDER — CEPHALEXIN 500 MG PO CAPS: 500.0000 mg | ORAL_CAPSULE | Freq: Four times a day (QID) | ORAL | 0 refills | Status: AC

## 2022-01-10 NOTE — Progress Notes (Signed)
   SUBJECTIVE:   CHIEF COMPLAINT / HPI:   LUMP - shaved yesterday, unsure if contributed. Duration:  this morning Location: R armpit Painful: yes Discomfort: yes Trauma: no Redness: yes Bruising: no Recent infection: no History of the same: no Associated signs and symptoms: none Treatments attempted: none   OBJECTIVE:   BP 128/72 (BP Location: Right Arm, Patient Position: Sitting, Cuff Size: Large)   Pulse 81   Temp 98.1 F (36.7 C) (Oral)   Resp 20   Ht '5\' 4"'$  (1.626 m) Comment: per chart  Wt (!) 311 lb 11.2 oz (141.4 kg)   LMP 08/04/2013   SpO2 96%   BMI 53.50 kg/m   Gen: well appearing, in NAD Skin: R underarm with slight induration and surrounding erythema, TTP and slight edema as compared to L underarm. No fluctuance or cystic structure appreciated.   ASSESSMENT/PLAN:   Cellulitis Likely bacterial introduction from shaving trauma. No evidence of abscess or cyst on exam. Rx keflex x5 days. Return and emergency precautions discussed.    Myles Gip, DO

## 2022-02-01 NOTE — Progress Notes (Unsigned)
Name: Dana Whitaker   MRN: [id number]    DOB: 10-17-1975   Date:02/02/2022       Progress Note  Subjective  Chief Complaint  Follow Up  HPI  Dyslipidemia:   The 10-year ASCVD risk score (Arnett DK, et al., 2019) is: 1.5%   Values used to calculate the score:     Age: 46 years     Sex: Female     Is Non-Hispanic African American: No     Diabetic: No     Tobacco smoker: Yes     Systolic Blood Pressure: 283 mmHg     Is BP treated: No     HDL Cholesterol: 77 mg/dL     Total Cholesterol: 209 mg/dL    Hypothyroidism: she was taking levothyroxine 75  mcg daily and two pills once a week.  she is due for TSH today since last level slightly above normal. . Denies dysphagia.    Pre-diabetes: she states she has polyphagia, polydipsia but no polyuria. Last A1C was 5.8 % . A1C was higher prior to her bariatric surgery in 2017 - it was 6.3 % . Last A1C was 6 %    Morbid obesity: she had bariatric surgery Dec 2017, her weight was 298 lbs prior to surgery, she went as low as 230 lbs, but gradually her weight went up , she was stable around 270 lbs for about one year while on Keto diet. She saw Dr. Redmond Pulling and was  taking phentermine but she stopped taking because it was causing headaches and gained weight right back when she stopped taking it.Minette Headland was too expensive. We tried Rybelsus but not covered by insurance. She was able to fill Wegovy 3 weeks ago and has lost 7 lbs since . She denies side effects    History of LGSIL and endometrium dysplasia, history of hysterectomy, under the care of Dr. Amalia Hailey, had a CPE July 2021 , repeat pap smear 2022 was also normal she states gyn is going to do pap smears every 2 years if also normal in 2023  Stable    Major depression/GAD/Borderline personality disorder : she was diagnosed by psychiatrist years ago.  She states she is doing well emotionally, Phq9 is 6 today ,  she stopped adderal on her own, caused dry mouth and felt anxious She does not like  taking medications She has a baby, a toddler and a 29 yo at home that is very hyper   ADHD: son also has ADHD, diagnosed by Dr. Leonides Schanz, she took Ritalin in the past. 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 She does not want to take medications at this time    GERD: she has been taking Pantoprazole and denies side effects, regurgitation heartburn and indigestion resolved with medication. 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/2016   Fatty liver 06/13/2016   Bilateral carpal tunnel syndrome 06/13/2016   Status post bariatric  surgery 06/13/2016   Hypertriglyceridemia 05/19/2016   History of fracture of left ankle 05/19/2016   Former cigarette smoker 02/17/2016   Prediabetes 02/17/2016   Status post laparoscopic hysterectomy 11/10/2015   Morbid obesity with BMI of 50.0-59.9, adult ([place]) 11/10/2015   Borderline personality disorder ([place]) 01/13/2015   ADHD (attention deficit hyperactivity disorder), combined type 01/13/2015   Apnea, sleep 01/13/2015   Insomnia, persistent 01/13/2015   Depression, major, recurrent, moderate ([place]) 01/13/2015   Fibromyalgia 01/13/2015   Generalized anxiety disorder 01/13/2015   Classical migraine with intractable migraine 09/12/2014   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    Past Surgical History:  Procedure Laterality Date   ABDOMINAL HYSTERECTOMY     tah.bso   CARPAL TUNNEL RELEASE     pt states did not have surgery just  injections    LAPAROSCOPIC GASTRIC SLEEVE RESECTION N/A 06/13/2016   Procedure: LAPAROSCOPIC GASTRIC SLEEVE RESECTION WITH HIATAL HERNIA REPAIR AND UPPER ENDOSCOPY;  Surgeon: Greer Pickerel, MD;  Location: [facility];  Service: General;  Laterality: N/A;   LEEP     TONSILLECTOMY     VARICOSE VEIN SURGERY      Family History  Problem Relation Age of Onset   Hyperlipidemia Mother    Bipolar disorder Mother    Heart attack Father    Drug abuse Father    Anxiety disorder Sister    Depression Sister    Anxiety disorder Sister    Depression Sister    ADD / ADHD Sister    Alcohol abuse Sister    Thyroid disease Maternal Grandmother    Diabetes Maternal Grandmother    Stroke Maternal Grandmother    Ovarian cancer Neg Hx    Breast cancer Neg Hx    Colon cancer Neg Hx     Social History   Tobacco Use   Smoking status: Former    Packs/day: 1.00    Years: 23.00    Total pack years: 23.00    Types: Cigarettes    Start date: 02/05/1994    Quit date: 05/10/2016    Years since quitting: 5.7   Smokeless tobacco: Never  Substance Use Topics   Alcohol use: Yes    Alcohol/week: 0.0 standard drinks of alcohol    Comment: occas     Current Outpatient Medications:    Cholecalciferol (VITAMIN D) 50 MCG (2000 UT) CAPS, Take 1 capsule (2,000 Units total) by mouth daily., Disp: 30 capsule, Rfl: 0   levothyroxine (SYNTHROID) 75 MCG tablet, Take 1 tablet (75 mcg total) by mouth daily. And skip once a week, Disp: 90 tablet, Rfl: 1   pantoprazole (PROTONIX) 40 MG tablet, Take 1 tablet (40 mg total) by mouth daily., Disp: 90 tablet, Rfl: 1   Semaglutide-Weight Management (WEGOVY) 0.25 MG/0.5ML SOAJ, Inject 0.25 mg into the skin once a week., Disp: 2 mL, Rfl: 0  Allergies  Allergen Reactions   Ciprofloxacin Hives   Tramadol Rash    I personally reviewed active problem list, medication list, allergies, family history, social history, health maintenance with the patient/caregiver today.   ROS  Ten  systems reviewed and is negative except as mentioned in HPI   Objective  Vitals:   02/02/22 0752  BP: 116/70  Pulse: 91  Resp: 16  SpO2: 97%  Weight: (!) 305 lb (138.3 kg)  Height: '5\' 3"'$  (1.6 m)    Body mass index is 54.03 kg/m.  Physical Exam  Constitutional: Patient appears well-developed and well-nourished. Obese  No distress.  HEENT: head atraumatic, normocephalic,  pupils equal and reactive to light, neck supple Cardiovascular: Normal rate, regular rhythm and normal heart sounds.  No murmur heard. No BLE edema. Pulmonary/Chest: Effort normal and breath sounds normal. No respiratory distress. Abdominal: Soft.  There is no tenderness. Psychiatric: Patient has a normal mood and affect. behavior is normal. Judgment and thought content normal.    PHQ2/9:    02/02/2022    7:57 AM 01/10/2022   11:46 AM 11/19/2021   10:23 AM 09/02/2021    7:55 AM 08/19/2021    7:38 AM  Depression screen PHQ 2/9  Decreased Interest '1 1 1 '$ 0 0  Down, Depressed, Hopeless 0 0 0 0 0  PHQ - 2 Score '1 1 1 '$ 0 0  Altered sleeping 0 0 0 0 0  Tired, decreased energy '3 3 3 '$ 0 0  Change in appetite 0 3 3 0 0  Feeling bad or failure about yourself  '1 2 2 '$ 0 0  Trouble concentrating 1 0 0 0 0  Moving slowly or fidgety/restless 0 0 0 0 0  Suicidal thoughts 0 0 0 0 0  PHQ-9 Score '6 9 9 '$ 0 0  Difficult doing work/chores  Not difficult at all Not difficult at all      phq 9 is positive   Fall Risk:    02/02/2022    7:51 AM 01/10/2022   11:46 AM 11/19/2021   10:23 AM 09/02/2021    7:55 AM 08/19/2021    7:38 AM  Fall Risk   Falls in the past year? 0 0 0 0 0  Number falls in past yr: 0   0 0  Injury with Fall? 0   0 0  Risk for fall due to : No Fall Risks No Fall Risks No Fall Risks No Fall Risks No Fall Risks  Follow up Falls prevention discussed Falls prevention discussed Falls prevention discussed Falls prevention discussed Falls prevention discussed      Functional Status Survey: Is the patient deaf or  have difficulty hearing?: No Does the patient have difficulty seeing, even when wearing glasses/contacts?: No Does the patient have difficulty concentrating, remembering, or making decisions?: No Does the patient have difficulty walking or climbing stairs?: Yes Does the patient have difficulty dressing or bathing?: No Does the patient have difficulty doing errands alone such as visiting a doctor's office or shopping?: No    Assessment & Plan  1. Morbid obesity with body mass index (BMI) of 50.0 to 59.9 in adult [facility])  - Semaglutide-Weight Management (WEGOVY) 0.5 MG/0.5ML SOAJ; Inject 0.5 mg into the skin once a week.  Dispense: 2 mL; Refill: 0 - Semaglutide-Weight Management (WEGOVY) 1.7 MG/0.75ML SOAJ; Inject 1.7 mg into the skin once a week. After the 1 mg dose  Dispense: 3 mL; Refill: 0 - Semaglutide-Weight Management (WEGOVY) 1 MG/0.5ML SOAJ; Inject 1 mg into the skin once a week. After 0.5 mg dose  Dispense: 2 mL; Refill: 0  2. GERD without esophagitis  - pantoprazole (PROTONIX) 40 MG tablet; Take 1 tablet (40 mg total) by mouth daily.  Dispense: 90 tablet; Refill: 1  3. Hypothyroidism (acquired)   4. B12 deficiency  - cyanocobalamin (VITAMIN B12) injection 1,000 mcg  5. Vitamin D deficiency   6. Status post bariatric surgery   7. Dyslipidemia

## 2022-02-02 ENCOUNTER — Ambulatory Visit: Payer: BC Managed Care – PPO | Admitting: Family Medicine

## 2022-02-02 ENCOUNTER — Encounter: Payer: Self-pay | Admitting: Family Medicine

## 2022-02-02 VITALS — BP 116/70 | HR 91 | Resp 16 | Ht 63.0 in | Wt 305.0 lb

## 2022-02-02 DIAGNOSIS — Z9884 Bariatric surgery status: Secondary | ICD-10-CM

## 2022-02-02 DIAGNOSIS — E538 Deficiency of other specified B group vitamins: Secondary | ICD-10-CM

## 2022-02-02 DIAGNOSIS — K219 Gastro-esophageal reflux disease without esophagitis: Secondary | ICD-10-CM | POA: Diagnosis not present

## 2022-02-02 DIAGNOSIS — E785 Hyperlipidemia, unspecified: Secondary | ICD-10-CM

## 2022-02-02 DIAGNOSIS — E559 Vitamin D deficiency, unspecified: Secondary | ICD-10-CM

## 2022-02-02 DIAGNOSIS — Z6841 Body Mass Index (BMI) 40.0 and over, adult: Secondary | ICD-10-CM | POA: Diagnosis not present

## 2022-02-02 DIAGNOSIS — E039 Hypothyroidism, unspecified: Secondary | ICD-10-CM

## 2022-02-02 DIAGNOSIS — R7303 Prediabetes: Secondary | ICD-10-CM

## 2022-02-02 MED ORDER — PANTOPRAZOLE SODIUM 40 MG PO TBEC: 40.0000 mg | DELAYED_RELEASE_TABLET | Freq: Every day | ORAL | 1 refills | Status: DC

## 2022-02-02 MED ORDER — WEGOVY 0.5 MG/0.5ML ~~LOC~~ SOAJ: 0.5000 mg | SUBCUTANEOUS | 0 refills | Status: DC

## 2022-02-02 MED ORDER — WEGOVY 1.7 MG/0.75ML ~~LOC~~ SOAJ: 1.7000 mg | SUBCUTANEOUS | 0 refills | Status: DC

## 2022-02-02 MED ORDER — CYANOCOBALAMIN 1000 MCG/ML IJ SOLN
1000.0000 ug | Freq: Once | INTRAMUSCULAR | Status: AC
  Administered 2022-02-02: 1000 ug via INTRAMUSCULAR

## 2022-02-02 MED ORDER — WEGOVY 1 MG/0.5ML ~~LOC~~ SOAJ: 1.0000 mg | SUBCUTANEOUS | 0 refills | Status: DC

## 2022-02-03 ENCOUNTER — Ambulatory Visit
Admission: RE | Admit: 2022-02-03 | Discharge: 2022-02-03 | Disposition: A | Discharge status: Home / Self Care | Payer: BC Managed Care – PPO | Source: Ambulatory Visit | Attending: Family Medicine | Admitting: Family Medicine

## 2022-02-03 ENCOUNTER — Other Ambulatory Visit: Payer: Self-pay | Admitting: Family Medicine

## 2022-02-03 DIAGNOSIS — Z1231 Encounter for screening mammogram for malignant neoplasm of breast: Secondary | ICD-10-CM | POA: Diagnosis present

## 2022-02-03 DIAGNOSIS — E039 Hypothyroidism, unspecified: Secondary | ICD-10-CM

## 2022-02-03 LAB — COLOGUARD: COLOGUARD: NEGATIVE

## 2022-02-03 LAB — HEMOGLOBIN A1C
Hgb A1c MFr Bld: 5.9 % of total Hgb — ABNORMAL HIGH (ref <5.7)
Mean Plasma Glucose: 123 mg/dL
eAG (mmol/L): 6.8 mmol/L

## 2022-02-03 LAB — TSH: TSH: 5.42 mIU/L — ABNORMAL HIGH

## 2022-02-03 MED ORDER — LEVOTHYROXINE SODIUM 88 MCG PO TABS: 88.0000 ug | ORAL_TABLET | Freq: Every day | ORAL | 0 refills | Status: DC

## 2022-02-04 ENCOUNTER — Other Ambulatory Visit: Payer: Self-pay | Admitting: Family Medicine

## 2022-02-04 DIAGNOSIS — R928 Other abnormal and inconclusive findings on diagnostic imaging of breast: Secondary | ICD-10-CM

## 2022-02-04 DIAGNOSIS — N6489 Other specified disorders of breast: Secondary | ICD-10-CM

## 2022-02-23 ENCOUNTER — Encounter: Payer: Self-pay | Admitting: Family Medicine

## 2022-02-24 ENCOUNTER — Ambulatory Visit
Admission: RE | Admit: 2022-02-24 | Discharge: 2022-02-24 | Disposition: A | Discharge status: Home / Self Care | Payer: BC Managed Care – PPO | Source: Ambulatory Visit | Attending: Family Medicine | Admitting: Family Medicine

## 2022-02-24 DIAGNOSIS — R928 Other abnormal and inconclusive findings on diagnostic imaging of breast: Secondary | ICD-10-CM | POA: Insufficient documentation

## 2022-02-24 DIAGNOSIS — N6489 Other specified disorders of breast: Secondary | ICD-10-CM | POA: Diagnosis present

## 2022-02-28 ENCOUNTER — Encounter: Payer: Self-pay | Admitting: Family Medicine

## 2022-05-02 NOTE — Progress Notes (Unsigned)
Name: Dana Whitaker   MRN: [id number]    DOB: 14-Oct-1975   Date:05/03/2022       Progress Note  Subjective  Chief Complaint  Follow up   HPI  Dyslipidemia:   The 10-year ASCVD risk score (Arnett DK, et al., 2019) is: 1.6%   Values used to calculate the score:     Age: 46 years     Sex: Female     Is Non-Hispanic African American: No     Diabetic: No     Tobacco smoker: Yes     Systolic Blood Pressure: 258 mmHg     Is BP treated: No     HDL Cholesterol: 77 mg/dL     Total Cholesterol: 209 mg/dL    Hypothyroidism: she was taking levothyroxine 75  mcg daily and two pills once a week and last TSH was above goal, she was given 88 mcg daily but she is still taking 75 mcg and skips weekends, discussed pill box, take it 7 days a week, can double dose if she skips the day before. Return in 6 weeks to recheck labs.   Pre-diabetes: she states she has polyphagia, polydipsia but no polyuria. Last A1C was 5.8 % . A1C was higher prior to her bariatric surgery in 2017 - it was 6.3 % . Last A1C was down to 5.9 %    Morbid obesity: she had bariatric surgery Dec 2017, her weight was 298 lbs prior to surgery, she went as low as 230 lbs, but gradually her weight went up , she was stable around 270 lbs for about one year while on Keto diet. She saw Dr. Redmond Pulling and was  taking phentermine but she stopped taking because it was causing headaches and gained weight right back when she stopped taking it.Minette Headland was too expensive. We tried Rybelsus but not covered by insurance. She is now on Wegovy, she had samples of Ozempic July 2023 followed by [facility], currently on 1 mg dose and is down 6 lbs. She has noticed medication curbs her appetite, eating smaller portions, not feeling as hungry as she used to and no longer having cravings    History of LGSIL and endometrium dysplasia, history of hysterectomy, under the care of Dr. Amalia Hailey, had a CPE July 2021 , repeat pap smear 2022 was also normal she states  gyn is going to do pap smears every 2 years if also normal in 2023  Unchanged    Major depression/GAD/Borderline personality disorder : she was diagnosed by psychiatrist years ago.  She states she is doing well emotionally, Phq9 is still positive ,  she stopped adderal on her own, caused dry mouth and felt anxious She does not like taking medications She has a baby, a toddler and a 61 yo at home that is very hyperactive. She states her husband likes to shop and her house is very disorganized because her husband is always buying something online    ADHD: son also has ADHD, diagnosed by Dr. Leonides Schanz, she took Ritalin in the past. 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 She does not want to take medications at this time Unchanged    GERD: she has been taking Pantoprazole and denies side effects, regurgitation heartburn and indigestion resolved with medication. 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/2016   Fatty liver 06/13/2016   Bilateral carpal tunnel syndrome 06/13/2016   Status post bariatric surgery 06/13/2016   Hypertriglyceridemia 05/19/2016   History of fracture of left ankle 05/19/2016   Former cigarette smoker 02/17/2016   Prediabetes 02/17/2016   Status post laparoscopic hysterectomy 11/10/2015   Morbid obesity with BMI of 50.0-59.9, adult ([place]) 11/10/2015   Borderline personality disorder ([place]) 01/13/2015   ADHD (attention deficit hyperactivity  disorder), combined type 01/13/2015   Apnea, sleep 01/13/2015   Insomnia, persistent 01/13/2015   Depression, major, recurrent, moderate ([place]) 01/13/2015   Fibromyalgia 01/13/2015   Generalized anxiety disorder 01/13/2015   Classical migraine with intractable migraine 09/12/2014   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    Past Surgical History:  Procedure Laterality Date   ABDOMINAL HYSTERECTOMY     tah.bso   CARPAL TUNNEL RELEASE     pt states did not have surgery just injections    LAPAROSCOPIC GASTRIC SLEEVE RESECTION N/A 06/13/2016   Procedure: LAPAROSCOPIC GASTRIC SLEEVE RESECTION WITH HIATAL HERNIA REPAIR AND UPPER ENDOSCOPY;  Surgeon: Greer Pickerel, MD;  Location: [facility];  Service: General;  Laterality: N/A;   LEEP     TONSILLECTOMY     VARICOSE VEIN SURGERY      Family History  Problem Relation Age of Onset   Hyperlipidemia Mother    Bipolar disorder Mother    Heart attack Father    Drug abuse Father    Anxiety disorder Sister    Depression Sister    Anxiety disorder Sister    Depression Sister    ADD / ADHD Sister    Alcohol abuse Sister    Thyroid disease Maternal Grandmother    Diabetes Maternal Grandmother    Stroke Maternal Grandmother    Ovarian cancer Neg Hx    Breast cancer Neg Hx    Colon cancer Neg Hx     Social History   Tobacco Use   Smoking status: Former    Packs/day: 1.00    Years: 23.00    Total pack years: 23.00    Types: Cigarettes    Start date: 02/05/1994    Quit date: 05/10/2016    Years since quitting: 5.9   Smokeless tobacco: Never  Substance Use Topics   Alcohol use: Yes    Alcohol/week: 0.0 standard drinks of alcohol    Comment: occas     Current Outpatient Medications:    Cholecalciferol (VITAMIN D) 50 MCG (2000 UT) CAPS, Take 1 capsule (2,000 Units total) by mouth daily., Disp: 30 capsule, Rfl: 0   levothyroxine (SYNTHROID) 88 MCG tablet, Take 1 tablet (88 mcg total) by mouth daily. And two pills once a week, Disp: 102  tablet, Rfl: 0   pantoprazole (PROTONIX) 40 MG tablet, Take 1 tablet (40 mg total) by mouth daily., Disp: 90 tablet, Rfl: 1   Semaglutide-Weight Management (WEGOVY) 1 MG/0.5ML SOAJ, Inject 1 mg into the skin once a week. After 0.5 mg dose, Disp: 2 mL, Rfl: 0   Semaglutide-Weight Management (WEGOVY) 1.7 MG/0.75ML SOAJ, Inject 1.7 mg into the skin once a week. After the 1 mg dose, Disp:  3 mL, Rfl: 0  Allergies  Allergen Reactions   Ciprofloxacin Hives   Tramadol Rash    I personally reviewed active problem list, medication list, allergies, family history, social history with the patient/caregiver today.   ROS  Ten systems reviewed and is negative except as mentioned in HPI   Objective  Vitals:   05/03/22 1536  BP: 122/74  Pulse: 80  Resp: 14  Temp: 97.8 F (36.6 C)  TempSrc: Oral  SpO2: 98%  Weight: 299 lb (135.6 kg)  Height: 5' 3.25" (1.607 m)    Body mass index is 52.55 kg/m.  Physical Exam  Constitutional: Patient appears well-developed and well-nourished. Obese  No distress.  HEENT: head atraumatic, normocephalic, pupils equal and reactive to light, neck supple Cardiovascular: Normal rate, regular rhythm and normal heart sounds.  No murmur heard. No BLE edema. Pulmonary/Chest: Effort normal and breath sounds normal. No respiratory distress. Abdominal: Soft.  There is no tenderness. Psychiatric: Patient has a normal mood and affect. behavior is normal. Judgment and thought content normal.     PHQ2/9:    05/03/2022    3:38 PM 02/02/2022    7:57 AM 01/10/2022   11:46 AM 11/19/2021   10:23 AM 09/02/2021    7:55 AM  Depression screen PHQ 2/9  Decreased Interest '1 1 1 1 '$ 0  Down, Depressed, Hopeless 1 0 0 0 0  PHQ - 2 Score '2 1 1 1 '$ 0  Altered sleeping 1 0 0 0 0  Tired, decreased energy '1 3 3 3 '$ 0  Change in appetite 1 0 3 3 0  Feeling bad or failure about yourself  0 '1 2 2 '$ 0  Trouble concentrating 0 1 0 0 0  Moving slowly or fidgety/restless 0 0 0 0 0  Suicidal  thoughts 0 0 0 0 0  PHQ-9 Score '5 6 9 9 '$ 0  Difficult doing work/chores Not difficult at all  Not difficult at all Not difficult at all     phq 9 is positive   Fall Risk:    05/03/2022    3:38 PM 02/02/2022    7:51 AM 01/10/2022   11:46 AM 11/19/2021   10:23 AM 09/02/2021    7:55 AM  Fall Risk   Falls in the past year? 0 0 0 0 0  Number falls in past yr:  0   0  Injury with Fall?  0   0  Risk for fall due to : No Fall Risks No Fall Risks No Fall Risks No Fall Risks No Fall Risks  Follow up Falls prevention discussed;Education provided;Falls evaluation completed Falls prevention discussed Falls prevention discussed Falls prevention discussed Falls prevention discussed     Functional Status Survey: Is the patient deaf or have difficulty hearing?: No Does the patient have difficulty seeing, even when wearing glasses/contacts?: No Does the patient have difficulty concentrating, remembering, or making decisions?: No Does the patient have difficulty walking or climbing stairs?: No Does the patient have difficulty dressing or bathing?: No Does the patient have difficulty doing errands alone such as visiting a doctor's office or shopping?: No    Assessment & Plan  1. Morbid obesity with body mass index (BMI) of 50.0 to 59.9 in adult (HCC)  - Semaglutide-Weight Management (WEGOVY) 2.4 MG/0.75ML SOAJ; Inject 2.4 mg into the skin once a week.  Dispense: 9 mL; Refill: 0  2. Mild recurrent major depression (HCC)  stable  3. Vitamin D deficiency  Continue supplementation   4.Hypothyroidism (acquired)  She needs to take medication daily   5. B12 deficiency  Take supplements   6. Dyslipidemia  Doing well   7. Prediabetes   8. Status post bariatric surgery

## 2022-05-03 ENCOUNTER — Ambulatory Visit (INDEPENDENT_AMBULATORY_CARE_PROVIDER_SITE_OTHER): Payer: BC Managed Care – PPO | Admitting: Family Medicine

## 2022-05-03 ENCOUNTER — Encounter: Payer: Self-pay | Admitting: Family Medicine

## 2022-05-03 VITALS — BP 122/74 | HR 80 | Temp 97.8°F | Resp 14 | Ht 63.25 in | Wt 299.0 lb

## 2022-05-03 DIAGNOSIS — E538 Deficiency of other specified B group vitamins: Secondary | ICD-10-CM

## 2022-05-03 DIAGNOSIS — F330 Major depressive disorder, recurrent, mild: Secondary | ICD-10-CM | POA: Diagnosis not present

## 2022-05-03 DIAGNOSIS — E039 Hypothyroidism, unspecified: Secondary | ICD-10-CM

## 2022-05-03 DIAGNOSIS — E785 Hyperlipidemia, unspecified: Secondary | ICD-10-CM

## 2022-05-03 DIAGNOSIS — E559 Vitamin D deficiency, unspecified: Secondary | ICD-10-CM

## 2022-05-03 DIAGNOSIS — Z9884 Bariatric surgery status: Secondary | ICD-10-CM

## 2022-05-03 DIAGNOSIS — R7303 Prediabetes: Secondary | ICD-10-CM

## 2022-05-03 DIAGNOSIS — Z23 Encounter for immunization: Secondary | ICD-10-CM | POA: Diagnosis not present

## 2022-05-03 DIAGNOSIS — Z6841 Body Mass Index (BMI) 40.0 and over, adult: Secondary | ICD-10-CM

## 2022-05-03 MED ORDER — WEGOVY 2.4 MG/0.75ML ~~LOC~~ SOAJ: 2.4000 mg | SUBCUTANEOUS | 0 refills | Status: DC

## 2022-05-19 ENCOUNTER — Ambulatory Visit (INDEPENDENT_AMBULATORY_CARE_PROVIDER_SITE_OTHER): Payer: BC Managed Care – PPO | Admitting: Nurse Practitioner

## 2022-05-19 ENCOUNTER — Other Ambulatory Visit: Payer: Self-pay

## 2022-05-19 ENCOUNTER — Encounter: Payer: Self-pay | Admitting: Family Medicine

## 2022-05-19 ENCOUNTER — Encounter: Payer: Self-pay | Admitting: Nurse Practitioner

## 2022-05-19 VITALS — BP 120/84 | HR 90 | Temp 97.9°F | Resp 18 | Ht 63.0 in | Wt 297.8 lb

## 2022-05-19 DIAGNOSIS — H1031 Unspecified acute conjunctivitis, right eye: Secondary | ICD-10-CM

## 2022-05-19 MED ORDER — POLYMYXIN B-TRIMETHOPRIM 10000-0.1 UNIT/ML-% OP SOLN: 1.0000 [drp] | OPHTHALMIC | 1 refills | Status: AC

## 2022-05-19 NOTE — Progress Notes (Signed)
   BP 120/84   Pulse 90   Temp 97.9 F (36.6 C) (Oral)   Resp 18   Ht '5\' 3"'$  (1.6 m)   Wt 297 lb 12.8 oz (135.1 kg)   LMP 08/04/2013   SpO2 97%   BMI 52.75 kg/m    Subjective:    Patient ID: Dana Whitaker, female    DOB: September 12, 1975, 46 y.o.   MRN: [id number]  HPI: Dana Whitaker is a 46 y.o. female  Chief Complaint  Patient presents with   Conjunctivitis   Conjunctivitis:  Patient reports symptoms started yesterday with itchy, water eye and discharge ( Right eye).  She says this morning she woke up and her eye was matted shut.  She says she has been using visine to help with the dry feeling.  She says that it has made it burn.  Will treat as bacterial conjunctivitis.  Start polytrim.  May use warm compresses to help with matting in the morning.    Relevant past medical, surgical, family and social history reviewed and updated as indicated. Interim medical history since our last visit reviewed. Allergies and medications reviewed and updated.  Review of Systems  Constitutional: Negative for fever or weight change.  HEENT: right eye pain and drainage Respiratory: Negative for cough and shortness of breath.   Cardiovascular: Negative for chest pain or palpitations.  Gastrointestinal: Negative for abdominal pain, no bowel changes.  Musculoskeletal: Negative for gait problem or joint swelling.  Skin: Negative for rash.  Neurological: Negative for dizziness or headache.  No other specific complaints in a complete review of systems (except as listed in HPI above).      Objective:    BP 120/84   Pulse 90   Temp 97.9 F (36.6 C) (Oral)   Resp 18   Ht '5\' 3"'$  (1.6 m)   Wt 297 lb 12.8 oz (135.1 kg)   LMP 08/04/2013   SpO2 97%   BMI 52.75 kg/m   Wt Readings from Last 3 Encounters:  05/19/22 297 lb 12.8 oz (135.1 kg)  05/03/22 299 lb (135.6 kg)  02/02/22 (!) 305 lb (138.3 kg)    Physical Exam  Constitutional: Patient appears well-developed and  well-nourished. Obese  No distress.  HEENT: head atraumatic, normocephalic, pupils equal and reactive to light, red conjunctiva to right eye,  neck supple Cardiovascular: Normal rate, regular rhythm and normal heart sounds.  No murmur heard. No BLE edema. Pulmonary/Chest: Effort normal and breath sounds normal. No respiratory distress. Abdominal: Soft.  There is no tenderness. Psychiatric: Patient has a normal mood and affect. behavior is normal. Judgment and thought content normal.     Assessment & Plan:   Problem List Items Addressed This Visit   None Visit Diagnoses     Acute bacterial conjunctivitis of right eye    -  Primary   Start polytrim drops, can use warm comresses to help with matting   Relevant Medications   trimethoprim-polymyxin b (POLYTRIM) ophthalmic solution        Follow up plan: Return if symptoms worsen or fail to improve.

## 2022-07-01 NOTE — Progress Notes (Unsigned)
Name: Dana Whitaker   MRN: [id number]    DOB: 13-Feb-1976   Date:07/01/2022       Progress Note  Subjective  Chief Complaint  Medication Refill  HPI  Dyslipidemia:   The 10-year ASCVD risk score (Arnett DK, et al., 2019) is: 1.6%   Values used to calculate the score:     Age: 46 years     Sex: Female     Is Non-Hispanic African American: No     Diabetic: No     Tobacco smoker: Yes     Systolic Blood Pressure: 678 mmHg     Is BP treated: No     HDL Cholesterol: 77 mg/dL     Total Cholesterol: 209 mg/dL    Hypothyroidism: she was taking levothyroxine 75  mcg daily and two pills once a week and last TSH was above goal, she was given 88 mcg daily but she is still taking 75 mcg and skips weekends, discussed pill box, take it 7 days a week, can double dose if she skips the day before. Return in 6 weeks to recheck labs.   Pre-diabetes: she states she has polyphagia, polydipsia but no polyuria. Last A1C was 5.8 % . A1C was higher prior to her bariatric surgery in 2017 - it was 6.3 % . Last A1C was down to 5.9 %    Morbid obesity: she had bariatric surgery Dec 2017, her weight was 298 lbs prior to surgery, she went as low as 230 lbs, but gradually her weight went up , she was stable around 270 lbs for about one year while on Keto diet. She saw Dr. Redmond Pulling and was  taking phentermine but she stopped taking because it was causing headaches and gained weight right back when she stopped taking it.Dana Whitaker was too expensive. We tried Rybelsus but not covered by insurance. She is now on Wegovy, she had samples of Ozempic July 2023 followed by [facility], currently on 1 mg dose and is down 6 lbs. She has noticed medication curbs her appetite, eating smaller portions, not feeling as hungry as she used to and no longer having cravings    History of LGSIL and endometrium dysplasia, history of hysterectomy, under the care of Dr. Amalia Hailey, had a CPE July 2021 , repeat pap smear 2022 was also normal she  states gyn is going to do pap smears every 2 years if also normal in 2023  Unchanged    Major depression/GAD/Borderline personality disorder : she was diagnosed by psychiatrist years ago.  She states she is doing well emotionally, Phq9 is still positive ,  she stopped adderal on her own, caused dry mouth and felt anxious She does not like taking medications She has a baby, a toddler and a 66 yo at home that is very hyperactive. She states her husband likes to shop and her house is very disorganized because her husband is always buying something online    ADHD: son also has ADHD, diagnosed by Dr. Leonides Schanz, she took Ritalin in the past. 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 She does not want to take medications at this time Unchanged    GERD: she has been taking Pantoprazole and denies side effects, regurgitation heartburn and indigestion resolved with medication. 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   Fatty liver 06/13/2016   Bilateral carpal tunnel syndrome 06/13/2016   Status post bariatric surgery 06/13/2016   Hypertriglyceridemia 05/19/2016   History of fracture of left ankle 05/19/2016   Former cigarette smoker 02/17/2016   Prediabetes 02/17/2016   Status post laparoscopic hysterectomy 11/10/2015   Morbid obesity with BMI of 50.0-59.9, adult ([place]) 11/10/2015   Borderline personality disorder ([place]) 01/13/2015   ADHD (attention deficit hyperactivity  disorder), combined type 01/13/2015   Apnea, sleep 01/13/2015   Insomnia, persistent 01/13/2015   Depression, major, recurrent, moderate ([place]) 01/13/2015   Fibromyalgia 01/13/2015   Generalized anxiety disorder 01/13/2015   Classical migraine with intractable migraine 09/12/2014   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    Past Surgical History:  Procedure Laterality Date   ABDOMINAL HYSTERECTOMY     tah.bso   CARPAL TUNNEL RELEASE     pt states did not have surgery just injections    LAPAROSCOPIC GASTRIC SLEEVE RESECTION N/A 06/13/2016   Procedure: LAPAROSCOPIC GASTRIC SLEEVE RESECTION WITH HIATAL HERNIA REPAIR AND UPPER ENDOSCOPY;  Surgeon: Greer Pickerel, MD;  Location: [facility];  Service: General;  Laterality: N/A;   LEEP     TONSILLECTOMY     VARICOSE VEIN SURGERY      Family History  Problem Relation Age of Onset   Hyperlipidemia Mother    Bipolar disorder Mother    Heart attack Father    Drug abuse Father    Anxiety disorder Sister    Depression Sister    Anxiety disorder Sister    Depression Sister    ADD / ADHD Sister    Alcohol abuse Sister    Thyroid disease Maternal Grandmother    Diabetes Maternal Grandmother    Stroke Maternal Grandmother    Ovarian cancer Neg Hx    Breast cancer Neg Hx    Colon cancer Neg Hx     Social History   Tobacco Use   Smoking status: Former    Packs/day: 1.00    Years: 23.00    Total pack years: 23.00    Types: Cigarettes    Start date: 02/05/1994    Quit date: 05/10/2016    Years since quitting: 6.1   Smokeless tobacco: Never  Substance Use Topics   Alcohol use: Yes    Alcohol/week: 0.0 standard drinks of alcohol    Comment: occas     Current Outpatient Medications:    Cholecalciferol (VITAMIN D) 50 MCG (2000 UT) CAPS, Take 1 capsule (2,000 Units total) by mouth daily., Disp: 30 capsule, Rfl: 0   levothyroxine (SYNTHROID) 88 MCG tablet, Take 1 tablet (88 mcg total) by mouth daily. And two pills once a week, Disp: 102  tablet, Rfl: 0   pantoprazole (PROTONIX) 40 MG tablet, Take 1 tablet (40 mg total) by mouth daily., Disp: 90 tablet, Rfl: 1   Semaglutide-Weight Management (WEGOVY) 1 MG/0.5ML SOAJ, Inject 1 mg into the skin once a week. After 0.5 mg dose, Disp: 2 mL, Rfl: 0   Semaglutide-Weight Management (WEGOVY) 1.7 MG/0.75ML SOAJ, Inject 1.7 mg into the skin once a week. After the 1 mg dose, Disp: 3 mL, Rfl:  0   Semaglutide-Weight Management (WEGOVY) 2.4 MG/0.75ML SOAJ, Inject 2.4 mg into the skin once a week., Disp: 9 mL, Rfl: 0  Allergies  Allergen Reactions   Ciprofloxacin Hives   Tramadol Rash    I personally reviewed active problem list, medication list, allergies, family history, social history, health maintenance with the patient/caregiver today.   ROS  ***  Objective  There were no vitals filed for this visit.  There is no height or weight on file to calculate BMI.  Physical Exam ***  No results found for this or any previous visit (from the past 2160 hour(s)).   PHQ2/9:    05/19/2022    9:51 AM 05/03/2022    3:38 PM 02/02/2022    7:57 AM 01/10/2022   11:46 AM 11/19/2021   10:23 AM  Depression screen PHQ 2/9  Decreased Interest 0 '1 1 1 1  '$ Down, Depressed, Hopeless 0 1 0 0 0  PHQ - 2 Score 0 '2 1 1 1  '$ Altered sleeping  1 0 0 0  Tired, decreased energy  '1 3 3 3  '$ Change in appetite  1 0 3 3  Feeling bad or failure about yourself   0 '1 2 2  '$ Trouble concentrating  0 1 0 0  Moving slowly or fidgety/restless  0 0 0 0  Suicidal thoughts  0 0 0 0  PHQ-9 Score  '5 6 9 9  '$ Difficult doing work/chores  Not difficult at all  Not difficult at all Not difficult at all    phq 9 is {gen pos [id number]}   Fall Risk:    05/19/2022    9:51 AM 05/03/2022    3:38 PM 02/02/2022    7:51 AM 01/10/2022   11:46 AM 11/19/2021   10:23 AM  Fall Risk   Falls in the past year? 0 0 0 0 0  Number falls in past yr: 0  0    Injury with Fall? 0  0    Risk for fall due to :  No Fall Risks No Fall Risks  No Fall Risks No Fall Risks  Follow up Falls evaluation completed Falls prevention discussed;Education provided;Falls evaluation completed Falls prevention discussed Falls prevention discussed Falls prevention discussed      Functional Status Survey:      Assessment & Plan  *** There are no diagnoses linked to this encounter.

## 2022-07-05 ENCOUNTER — Encounter: Payer: Self-pay | Admitting: Family Medicine

## 2022-07-05 ENCOUNTER — Other Ambulatory Visit: Payer: Self-pay | Admitting: Family Medicine

## 2022-07-05 ENCOUNTER — Ambulatory Visit: Payer: BC Managed Care – PPO | Admitting: Family Medicine

## 2022-07-05 VITALS — BP 122/78 | HR 86 | Resp 16 | Ht 63.0 in | Wt 288.0 lb

## 2022-07-05 DIAGNOSIS — E538 Deficiency of other specified B group vitamins: Secondary | ICD-10-CM

## 2022-07-05 DIAGNOSIS — E559 Vitamin D deficiency, unspecified: Secondary | ICD-10-CM

## 2022-07-05 DIAGNOSIS — K219 Gastro-esophageal reflux disease without esophagitis: Secondary | ICD-10-CM

## 2022-07-05 DIAGNOSIS — F325 Major depressive disorder, single episode, in full remission: Secondary | ICD-10-CM

## 2022-07-05 DIAGNOSIS — Z9884 Bariatric surgery status: Secondary | ICD-10-CM

## 2022-07-05 DIAGNOSIS — E785 Hyperlipidemia, unspecified: Secondary | ICD-10-CM

## 2022-07-05 DIAGNOSIS — Z6841 Body Mass Index (BMI) 40.0 and over, adult: Secondary | ICD-10-CM

## 2022-07-05 DIAGNOSIS — F330 Major depressive disorder, recurrent, mild: Secondary | ICD-10-CM

## 2022-07-05 DIAGNOSIS — E039 Hypothyroidism, unspecified: Secondary | ICD-10-CM

## 2022-07-05 MED ORDER — PANTOPRAZOLE SODIUM 40 MG PO TBEC: 40.0000 mg | DELAYED_RELEASE_TABLET | Freq: Every day | ORAL | 1 refills | Status: DC

## 2022-07-05 MED ORDER — WEGOVY 2.4 MG/0.75ML ~~LOC~~ SOAJ: 2.4000 mg | SUBCUTANEOUS | 0 refills | Status: DC

## 2022-09-05 NOTE — Patient Instructions (Signed)
Preventive Care 40-46 Years Old, Female Preventive care refers to lifestyle choices and visits with your health care provider that can promote health and wellness. Preventive care visits are also called wellness exams. What can I expect for my preventive care visit? Counseling Your health care provider may ask you questions about your: Medical history, including: Past medical problems. Family medical history. Pregnancy history. Current health, including: Menstrual cycle. Method of birth control. Emotional well-being. Home life and relationship well-being. Sexual activity and sexual health. Lifestyle, including: Alcohol, nicotine or tobacco, and drug use. Access to firearms. Diet, exercise, and sleep habits. Work and work environment. Sunscreen use. Safety issues such as seatbelt and bike helmet use. Physical exam Your health care provider will check your: Height and weight. These may be used to calculate your BMI (body mass index). BMI is a measurement that tells if you are at a healthy weight. Waist circumference. This measures the distance around your waistline. This measurement also tells if you are at a healthy weight and may help predict your risk of certain diseases, such as type 2 diabetes and high blood pressure. Heart rate and blood pressure. Body temperature. Skin for abnormal spots. What immunizations do I need?  Vaccines are usually given at various ages, according to a schedule. Your health care provider will recommend vaccines for you based on your age, medical history, and lifestyle or other factors, such as travel or where you work. What tests do I need? Screening Your health care provider may recommend screening tests for certain conditions. This may include: Lipid and cholesterol levels. Diabetes screening. This is done by checking your blood sugar (glucose) after you have not eaten for a while (fasting). Pelvic exam and Pap test. Hepatitis B test. Hepatitis C  test. HIV (human immunodeficiency virus) test. STI (sexually transmitted infection) testing, if you are at risk. Lung cancer screening. Colorectal cancer screening. Mammogram. Talk with your health care provider about when you should start having regular mammograms. This may depend on whether you have a family history of breast cancer. BRCA-related cancer screening. This may be done if you have a family history of breast, ovarian, tubal, or peritoneal cancers. Bone density scan. This is done to screen for osteoporosis. Talk with your health care provider about your test results, treatment options, and if necessary, the need for more tests. Follow these instructions at home: Eating and drinking  Eat a diet that includes fresh fruits and vegetables, whole grains, lean protein, and low-fat dairy products. Take vitamin and mineral supplements as recommended by your health care provider. Do not drink alcohol if: Your health care provider tells you not to drink. You are pregnant, may be pregnant, or are planning to become pregnant. If you drink alcohol: Limit how much you have to 0-1 drink a day. Know how much alcohol is in your drink. In the [place] one drink equals one 12 oz bottle of beer (355 mL), one 5 oz glass of wine (148 mL), or one 1 oz glass of hard liquor (44 mL). Lifestyle Brush your teeth every morning and night with fluoride toothpaste. Floss one time each day. Exercise for at least 30 minutes 5 or more days each week. Do not use any products that contain nicotine or tobacco. These products include cigarettes, chewing tobacco, and vaping devices, such as e-cigarettes. If you need help quitting, ask your health care provider. Do not use drugs. If you are sexually active, practice safe sex. Use a condom or other form of protection to   prevent STIs. If you do not wish to become pregnant, use a form of birth control. If you plan to become pregnant, see your health care provider for a  prepregnancy visit. Take aspirin only as told by your health care provider. Make sure that you understand how much to take and what form to take. Work with your health care provider to find out whether it is safe and beneficial for you to take aspirin daily. Find healthy ways to manage stress, such as: Meditation, yoga, or listening to music. Journaling. Talking to a trusted person. Spending time with friends and family. Minimize exposure to UV radiation to reduce your risk of skin cancer. Safety Always wear your seat belt while driving or riding in a vehicle. Do not drive: If you have been drinking alcohol. Do not ride with someone who has been drinking. When you are tired or distracted. While texting. If you have been using any mind-altering substances or drugs. Wear a helmet and other protective equipment during sports activities. If you have firearms in your house, make sure you follow all gun safety procedures. Seek help if you have been physically or sexually abused. What's next? Visit your health care provider once a year for an annual wellness visit. Ask your health care provider how often you should have your eyes and teeth checked. Stay up to date on all vaccines. This information is not intended to replace advice given to you by your health care provider. Make sure you discuss any questions you have with your health care provider. Document Revised: 12/16/2020 Document Reviewed: 12/16/2020 Elsevier Patient Education  2023 Elsevier Inc.  

## 2022-09-05 NOTE — Progress Notes (Unsigned)
Name: Dana Whitaker   MRN: [id number]    DOB: 07/16/1975   Date:09/06/2022       Progress Note  Subjective  Chief Complaint  Annual Exam  HPI  Patient presents for annual CPE.  Diet: cooks at home, packing lunch , when she eats out she is eliminating fries and choosing healthier options  Exercise: discussed importance of regular physical activity   Last Eye Exam: needs to schedule it  Last Dental Exam: she goes 4 times a year for periodontal exam and cleaning   [place] Office Visit from 09/06/2022 in [facility]  AUDIT-C Score 1      Depression: Phq 9 is  negative    09/06/2022    7:53 AM 07/05/2022    7:50 AM 05/19/2022    9:51 AM 05/03/2022    3:38 PM 02/02/2022    7:57 AM  Depression screen PHQ 2/9  Decreased Interest 0 0 0 1 1  Down, Depressed, Hopeless 0 0 0 1 0  PHQ - 2 Score 0 0 0 2 1  Altered sleeping 3 0  1 0  Tired, decreased energy 3 0  1 3  Change in appetite 0 0  1 0  Feeling bad or failure about yourself  0 0  0 1  Trouble concentrating 0 0  0 1  Moving slowly or fidgety/restless 0 0  0 0  Suicidal thoughts 0 0  0 0  PHQ-9 Score 6 0  5 6  Difficult doing work/chores    Not difficult at all    Hypertension: BP Readings from Last 3 Encounters:  09/06/22 126/72  07/05/22 122/78  05/19/22 120/84   Obesity: Wt Readings from Last 3 Encounters:  09/06/22 279 lb (126.6 kg)  07/05/22 288 lb (130.6 kg)  05/19/22 297 lb 12.8 oz (135.1 kg)   BMI Readings from Last 3 Encounters:  09/06/22 49.42 kg/m  07/05/22 51.02 kg/m  05/19/22 52.75 kg/m     Vaccines:   HPV: N/A Tdap: up to date Shingrix: N/A Pneumonia: N/A Flu: up to date COVID-25: up to date   Hep C Screening: 02/28/20 STD testing and prevention (HIV/chl/gon/syphilis): 08/22/17 Intimate partner violence: negative screen  Sexual History :not sexually active in the past 4 years - married  Menstrual History/LMP/Abnormal Bleeding: s/p hysterectomy , she  sees gyn due to vaginal dysplasia  Discussed importance of follow up if any post-menopausal bleeding: yes  Incontinence Symptoms: negative for symptoms   Breast cancer:  - Last Mammogram: 02/03/22 - BRCA gene screening: N/A   Osteoporosis Prevention : Discussed high calcium and vitamin D supplementation, weight bearing exercises Bone density: N/A   Cervical cancer screening: 01/12/21  Skin cancer: Discussed monitoring for atypical lesions she will dermatologist soon  Colorectal cancer: 01/25/22  repeat cologuard in 2026  Lung cancer:  Low Dose CT Chest recommended if Age 84-80 years, 20 pack-year currently smoking OR have quit w/in 15years. Patient does not qualify for screen   She quit smoking in 20217 ECG: 02/04/16  Advanced Care Planning: A voluntary discussion about advance care planning including the explanation and discussion of advance directives.  Discussed health care proxy and Living will, and the patient was able to identify a health care proxy as husband .  Patient does not have a living will and power of attorney of health care   Lipids: Lab Results  Component Value Date   CHOL 209 (H) 08/19/2021   CHOL 204 (H) 09/01/2020  CHOL 204 (H) 02/19/2020   Lab Results  Component Value Date   HDL 77 08/19/2021   HDL 76 09/01/2020   HDL 72 02/19/2020   Lab Results  Component Value Date   LDLCALC 116 (H) 08/19/2021   LDLCALC 115 (H) 09/01/2020   LDLCALC 120 (H) 02/19/2020   Lab Results  Component Value Date   TRIG 64 08/19/2021   TRIG 52 09/01/2020   TRIG 68 02/19/2020   Lab Results  Component Value Date   CHOLHDL 2.7 08/19/2021   CHOLHDL 2.7 09/01/2020   CHOLHDL 2.8 02/19/2020   No results found for: "LDLDIRECT"  Glucose: Glucose  Date Value Ref Range Status  02/19/2020 88 65 - 99 mg/dL Final  03/07/2014 112 (H) 65 - 99 mg/dL Final  08/14/2013 95 65 - 99 mg/dL Final   Glucose, Bld  Date Value Ref Range Status  08/19/2021 97 65 - 99 mg/dL Final    Comment:     .            Fasting reference interval .   09/01/2020 95 65 - 99 mg/dL Final    Comment:    .            Fasting reference interval .   11/16/2018 88 65 - 99 mg/dL Final    Comment:    .            Fasting reference interval .    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   Fatty liver 06/13/2016   Bilateral carpal tunnel syndrome 06/13/2016   Status post bariatric surgery 06/13/2016   Hypertriglyceridemia 05/19/2016   History of fracture of left ankle 05/19/2016   Former cigarette smoker 02/17/2016   Prediabetes 02/17/2016   Status post laparoscopic hysterectomy 11/10/2015   Morbid obesity with BMI of 50.0-59.9, adult ([place]) 11/10/2015   Borderline personality disorder ([place]) 01/13/2015   ADHD (attention deficit hyperactivity disorder), combined type 01/13/2015   Apnea, sleep 01/13/2015   Insomnia, persistent 01/13/2015   Depression, major, recurrent, moderate ([place]) 01/13/2015   Fibromyalgia 01/13/2015   Generalized anxiety disorder 01/13/2015   Classical migraine with intractable migraine 09/12/2014   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    Past Surgical History:  Procedure Laterality Date   ABDOMINAL HYSTERECTOMY     [place].[email or web address]   CARPAL TUNNEL RELEASE     pt states did not have surgery just injections    LAPAROSCOPIC GASTRIC SLEEVE RESECTION N/A 06/13/2016   Procedure: LAPAROSCOPIC GASTRIC SLEEVE RESECTION WITH HIATAL HERNIA REPAIR AND UPPER ENDOSCOPY;  Surgeon: Greer Pickerel, MD;  Location: [facility]  [facility];  Service: General;  Laterality: N/A;   LEEP     TONSILLECTOMY     VARICOSE VEIN SURGERY      Family History  Problem Relation Age of Onset   Hyperlipidemia Mother    Bipolar disorder Mother    Heart attack Father    Drug abuse Father    Anxiety disorder Sister    Depression Sister  Anxiety disorder Sister    Depression Sister    ADD / ADHD Sister    Alcohol abuse Sister    Thyroid disease Maternal Grandmother    Diabetes Maternal Grandmother    Stroke Maternal Grandmother    Ovarian cancer Neg Hx    Breast cancer Neg Hx    Colon cancer Neg Hx     Social History   Socioeconomic History   Marital status: Married    Spouse name: Not on file   Number of children: 1   Years of education: Not on file   Highest education level: Associate degree: academic program  Occupational History   Occupation: accountant  Tobacco Use   Smoking status: Former    Packs/day: 1.00    Years: 23.00    Total pack years: 23.00    Types: Cigarettes    Start date: 02/05/1994    Quit date: 05/10/2016    Years since quitting: 6.3   Smokeless tobacco: Never  Vaping Use   Vaping Use: Never used  Substance and Sexual Activity   Alcohol use: Yes    Alcohol/week: 0.0 standard drinks of alcohol    Comment: occas   Drug use: No   Sexual activity: Yes    Birth control/protection: Surgical  Other Topics Concern   Not on file  Social History Narrative   Married,    Has a grown son, raising step-daughter Forensic scientist ) , also fostering/raising a 46 yo - Emma's half sister and now Emma's half brother    Going to school, trying to finish her bachelor in business administration    Social Determinants of Health   Financial Resource Strain: Low Risk  (09/06/2022)   Overall Financial Resource Strain (CARDIA)    Difficulty of Paying Living Expenses: Not very hard  Food Insecurity: No Food Insecurity (09/06/2022)   Hunger Vital Sign    Worried About Running Out of Food in the Last Year: Never true    [place] in the Last Year: Never true  Transportation Needs: No Transportation Needs (09/06/2022)   PRAPARE - Hydrologist (Medical): No    Lack of Transportation (Non-Medical): No  Physical Activity: Inactive (09/06/2022)   Exercise Vital Sign    Days of Exercise per Week: 0 days    Minutes of Exercise per Session: 0 min  Stress: Stress Concern Present (09/06/2022)   [place]    Feeling of Stress : To some extent  Social Connections: Socially Integrated (09/06/2022)   Social Connection and Isolation Panel [NHANES]    Frequency of Communication with Friends and Family: More than three times a week    Frequency of Social Gatherings with Friends and Family: Once a week    Attends Religious Services: More than 4 times per year    Active Member of [employer] or Organizations: Yes    Attends Music therapist: More than 4 times per year    Marital Status: Married  Human resources officer Violence: Not At Risk (09/06/2022)   Humiliation, Afraid, Rape, and Kick questionnaire    Fear of Current or Ex-Partner: No    Emotionally Abused: No    Physically Abused: No    Sexually Abused: No     Current Outpatient Medications:    Cholecalciferol (VITAMIN D) 50 MCG (2000 UT) CAPS, Take 1 capsule (2,000 Units total) by mouth daily., Disp: 30 capsule, Rfl: 0   levothyroxine (SYNTHROID) 88  MCG tablet, Take 1 tablet (88 mcg total) by mouth daily. And two pills once a week, Disp: 102 tablet, Rfl: 0   pantoprazole (PROTONIX) 40 MG tablet, Take 1 tablet (40 mg total) by mouth daily., Disp: 90 tablet, Rfl: 1   Semaglutide-Weight Management (WEGOVY) 2.4 MG/0.75ML SOAJ, Inject 2.4 mg into the skin once a week., Disp: 9 mL, Rfl: 0  Allergies  Allergen Reactions   Ciprofloxacin Hives   Tramadol Rash     ROS  Constitutional: Negative for fever , positive for weight change.  Respiratory: Negative for cough and  shortness of breath.   Cardiovascular: Negative for chest pain or palpitations.  Gastrointestinal: Negative for abdominal pain, no bowel changes.  Musculoskeletal: Negative for gait problem or joint swelling.  Skin: Negative for rash.  Neurological: Negative for dizziness or headache.  No other specific complaints in a complete review of systems (except as listed in HPI above).   Objective  Vitals:   09/06/22 0754  BP: 126/72  Pulse: 94  Resp: 16  SpO2: 98%  Weight: 279 lb (126.6 kg)  Height: '5\' 3"'$  (1.6 m)    Body mass index is 49.42 kg/m.  Physical Exam  Constitutional: Patient appears well-developed and obese . No distress.  HENT: Head: Normocephalic and atraumatic. Ears: B TMs ok, no erythema or effusion; Nose: Nose normal. Mouth/Throat: Oropharynx is clear and moist. No oropharyngeal exudate.  Eyes: Conjunctivae and EOM are normal. Pupils are equal, round, and reactive to light. No scleral icterus.  Neck: Normal range of motion. Neck supple. No JVD present. No thyromegaly present.  Cardiovascular: Normal rate, regular rhythm and normal heart sounds.  No murmur heard. No BLE edema. Pulmonary/Chest: Effort normal and breath sounds normal. No respiratory distress. Abdominal: Soft. Bowel sounds are normal, no distension. There is no tenderness. no masses Breast: no lumps or masses, no nipple discharge or rashes FEMALE GENITALIA:  Not done  RECTAL: not done  Musculoskeletal: Normal range of motion, no joint effusions. No gross deformities Neurological: he is alert and oriented to person, place, and time. No cranial nerve deficit. Coordination, balance, strength, speech and gait are normal.  Skin: erythematous papule above right brow , she will see Dermatologist in 2 weeks Psychiatric: Patient has a normal mood and affect. behavior is normal. Judgment and thought content normal.    Fall Risk:    09/06/2022    7:53 AM 07/05/2022    7:49 AM 05/19/2022    9:51 AM 05/03/2022     3:38 PM 02/02/2022    7:51 AM  Fall Risk   Falls in the past year? 0 0 0 0 0  Number falls in past yr: 0 0 0  0  Injury with Fall? 0 0 0  0  Risk for fall due to : No Fall Risks No Fall Risks  No Fall Risks No Fall Risks  Follow up Falls prevention discussed Falls prevention discussed Falls evaluation completed Falls prevention discussed;Education provided;Falls evaluation completed Falls prevention discussed     Functional Status Survey: Is the patient deaf or have difficulty hearing?: No Does the patient have difficulty seeing, even when wearing glasses/contacts?: No Does the patient have difficulty concentrating, remembering, or making decisions?: No Does the patient have difficulty walking or climbing stairs?: No Does the patient have difficulty dressing or bathing?: No Does the patient have difficulty doing errands alone such as visiting a doctor's office or shopping?: No   Assessment & Plan  1. Well adult exam  - Lipid  panel - COMPLETE METABOLIC PANEL WITH GFR - CBC with Differential/Platelet - Hemoglobin A1c - VITAMIN D 25 Hydroxy (Vit-D Deficiency, Fractures) - TSH - B12 and Folate Panel  2. Vitamin D deficiency  - VITAMIN D 25 Hydroxy (Vit-D Deficiency, Fractures)  3. B12 deficiency  - B12 and Folate Panel  4. Dyslipidemia  - Lipid panel  5. Hypothyroidism (acquired)  - TSH  6. Long-term use of high-risk medication  - COMPLETE METABOLIC PANEL WITH GFR - CBC with Differential/Platelet  7. Insulin resistance  - Hemoglobin A1c  8. Breast cancer screening by mammogram  - MM 3D SCREENING MAMMOGRAM BILATERAL BREAST; Future   -USPSTF grade A and B recommendations reviewed with patient; age-appropriate recommendations, preventive care, screening tests, etc discussed and encouraged; healthy living encouraged; see AVS for patient education given to patient -Discussed importance of 150 minutes of physical activity weekly, eat two servings of fish weekly, eat  one serving of tree nuts ( cashews, pistachios, pecans, almonds.Marland Kitchen) every other day, eat 6 servings of fruit/vegetables daily and drink plenty of water and avoid sweet beverages.   -Reviewed Health Maintenance: Yes.

## 2022-09-06 ENCOUNTER — Ambulatory Visit (INDEPENDENT_AMBULATORY_CARE_PROVIDER_SITE_OTHER): Payer: BC Managed Care – PPO | Admitting: Family Medicine

## 2022-09-06 ENCOUNTER — Encounter: Payer: Self-pay | Admitting: Family Medicine

## 2022-09-06 VITALS — BP 126/72 | HR 94 | Resp 16 | Ht 63.0 in | Wt 279.0 lb

## 2022-09-06 DIAGNOSIS — Z0000 Encounter for general adult medical examination without abnormal findings: Secondary | ICD-10-CM | POA: Diagnosis not present

## 2022-09-06 DIAGNOSIS — Z79899 Other long term (current) drug therapy: Secondary | ICD-10-CM

## 2022-09-06 DIAGNOSIS — E88819 Insulin resistance, unspecified: Secondary | ICD-10-CM

## 2022-09-06 DIAGNOSIS — E538 Deficiency of other specified B group vitamins: Secondary | ICD-10-CM

## 2022-09-06 DIAGNOSIS — Z1231 Encounter for screening mammogram for malignant neoplasm of breast: Secondary | ICD-10-CM

## 2022-09-06 DIAGNOSIS — E039 Hypothyroidism, unspecified: Secondary | ICD-10-CM

## 2022-09-06 DIAGNOSIS — E785 Hyperlipidemia, unspecified: Secondary | ICD-10-CM

## 2022-09-06 DIAGNOSIS — E559 Vitamin D deficiency, unspecified: Secondary | ICD-10-CM | POA: Diagnosis not present

## 2022-09-07 ENCOUNTER — Other Ambulatory Visit: Payer: Self-pay | Admitting: Family Medicine

## 2022-09-07 DIAGNOSIS — E538 Deficiency of other specified B group vitamins: Secondary | ICD-10-CM

## 2022-09-07 LAB — COMPLETE METABOLIC PANEL WITH GFR
AG Ratio: 1.4 (calc) (ref 1.0–2.5)
ALT: 11 U/L (ref 6–29)
AST: 14 U/L (ref 10–35)
Albumin: 3.9 g/dL (ref 3.6–5.1)
Alkaline phosphatase (APISO): 57 U/L (ref 31–125)
BUN: 13 mg/dL (ref 7–25)
CO2: 24 mmol/L (ref 20–32)
Calcium: 8.9 mg/dL (ref 8.6–10.2)
Chloride: 109 mmol/L (ref 98–110)
Creat: <0.2 mg/dL — ABNORMAL LOW (ref 0.50–0.99)
Globulin: 2.8 g/dL (calc) (ref 1.9–3.7)
Glucose, Bld: 85 mg/dL (ref 65–99)
Potassium: 3.9 mmol/L (ref 3.5–5.3)
Sodium: 143 mmol/L (ref 135–146)
Total Bilirubin: 0.4 mg/dL (ref 0.2–1.2)
Total Protein: 6.7 g/dL (ref 6.1–8.1)

## 2022-09-07 LAB — LIPID PANEL
Cholesterol: 163 mg/dL (ref <200)
HDL: 67 mg/dL (ref >=50)
LDL Cholesterol (Calc): 82 mg/dL (calc)
Non-HDL Cholesterol (Calc): 96 mg/dL (calc) (ref <130)
Total CHOL/HDL Ratio: 2.4 (calc) (ref <5.0)
Triglycerides: 59 mg/dL (ref <150)

## 2022-09-07 LAB — CBC WITH DIFFERENTIAL/PLATELET
Absolute Monocytes: 441 cells/uL (ref 200–950)
Basophils Absolute: 9 cells/uL (ref 0–200)
Basophils Relative: 0.2 %
Eosinophils Absolute: 59 cells/uL (ref 15–500)
Eosinophils Relative: 1.3 %
HCT: 39.4 % (ref 35.0–45.0)
Hemoglobin: 12.8 g/dL (ref 11.7–15.5)
Lymphs Abs: 1107 cells/uL (ref 850–3900)
MCH: 28.1 pg (ref 27.0–33.0)
MCHC: 32.5 g/dL (ref 32.0–36.0)
MCV: 86.4 fL (ref 80.0–100.0)
MPV: 10 fL (ref 7.5–12.5)
Monocytes Relative: 9.8 %
Neutro Abs: 2885 cells/uL (ref 1500–7800)
Neutrophils Relative %: 64.1 %
Platelets: 235 10*3/uL (ref 140–400)
RBC: 4.56 10*6/uL (ref 3.80–5.10)
RDW: 14 % (ref 11.0–15.0)
Total Lymphocyte: 24.6 %
WBC: 4.5 10*3/uL (ref 3.8–10.8)

## 2022-09-07 LAB — VITAMIN D 25 HYDROXY (VIT D DEFICIENCY, FRACTURES): Vit D, 25-Hydroxy: 32 ng/mL (ref 30–100)

## 2022-09-07 LAB — HEMOGLOBIN A1C
Hgb A1c MFr Bld: 5.8 % of total Hgb — ABNORMAL HIGH (ref <5.7)
Mean Plasma Glucose: 120 mg/dL
eAG (mmol/L): 6.6 mmol/L

## 2022-09-07 LAB — B12 AND FOLATE PANEL
Folate: 5.2 ng/mL — ABNORMAL LOW
Vitamin B-12: 314 pg/mL (ref 200–1100)

## 2022-09-07 LAB — TSH: TSH: 4.14 mIU/L

## 2022-09-07 MED ORDER — FOLIC ACID 1 MG PO TABS: 1.0000 mg | ORAL_TABLET | Freq: Every day | ORAL | 1 refills | Status: DC

## 2022-09-07 MED ORDER — B-12 1000 MCG SL SUBL: 1.0000 | SUBLINGUAL_TABLET | Freq: Every day | SUBLINGUAL | 1 refills | Status: DC

## 2022-09-19 ENCOUNTER — Encounter: Payer: Self-pay | Admitting: Family Medicine

## 2022-09-20 ENCOUNTER — Ambulatory Visit
Admission: RE | Admit: 2022-09-20 | Discharge: 2022-09-20 | Disposition: A | Discharge status: Home / Self Care | Payer: BC Managed Care – PPO | Attending: Physician Assistant | Admitting: Physician Assistant

## 2022-09-20 ENCOUNTER — Encounter: Payer: Self-pay | Admitting: Physician Assistant

## 2022-09-20 ENCOUNTER — Ambulatory Visit: Payer: BC Managed Care – PPO | Admitting: Physician Assistant

## 2022-09-20 ENCOUNTER — Ambulatory Visit
Admission: RE | Admit: 2022-09-20 | Discharge: 2022-09-20 | Disposition: A | Discharge status: Home / Self Care | Payer: BC Managed Care – PPO | Source: Ambulatory Visit | Attending: Physician Assistant | Admitting: Physician Assistant

## 2022-09-20 VITALS — BP 128/80 | HR 80 | Temp 97.7°F | Resp 16 | Ht 63.0 in | Wt 276.9 lb

## 2022-09-20 DIAGNOSIS — M25561 Pain in right knee: Secondary | ICD-10-CM

## 2022-09-20 MED ORDER — NAPROXEN 500 MG PO TABS: 500.0000 mg | ORAL_TABLET | Freq: Two times a day (BID) | ORAL | 0 refills | Status: DC

## 2022-09-20 MED ORDER — PREDNISONE 20 MG PO TABS: ORAL_TABLET | ORAL | 0 refills | Status: DC

## 2022-09-20 NOTE — Progress Notes (Signed)
Acute Office Visit   Patient: Dana Whitaker   DOB: 10-01-1975   47 y.o. Female  MRN: [id number] Visit Date: 09/20/2022  Today's healthcare provider: Dani Gobble Norena Bratton, PA-C  Introduced myself to the patient as a Journalist, newspaper and provided education on APPs in clinical practice.    Chief Complaint  Patient presents with   Knee Pain    Right knee onset for 5 days notice some swelling around , pt states few years ago she was in a boot camp doing squat weights and injured back in that time.   Subjective    HPI HPI     Knee Pain    Additional comments: Right knee onset for 5 days notice some swelling around , pt states few years ago she was in a boot camp doing squat weights and injured back in that time.      Last edited by Salomon Fick, [place] on 09/20/2022  9:52 AM.        She reports her right knee has started to hurt and has been swollen the past few days She states she has been doing a lot of work around her home with renovations and cleaning- has been climbing ladders, on hands and knees cleaning, overall more active  She states she has injured this knee in the past- reports her knee felt like it gave out while she was doing weighted squats  She reports since this injury she has had issues climbing stairs  She reports stepping off a ladder wrong but is not sure which leg was impacted She reports carrying 30-40 lb boxes upstairs, team lifting heavy boards   Pain level and character: 8/10 is worst pain, at rest it is throbbing and sharp, when walking it can be more stabbing in nature  She reports pain is mostly in medial aspect of knee but does radiate up her lateral thigh and down lateral shin She reports it is aggravated the most by climbing stairs   Interventions: ibuprofen, stretches  She has not tried braces, warm compresses, tylenol      Medications: Outpatient Medications Prior to Visit  Medication Sig   Cholecalciferol (VITAMIN D) 50 MCG  (2000 UT) CAPS Take 1 capsule (2,000 Units total) by mouth daily.   Cyanocobalamin (B-12) 1000 MCG SUBL Place 1 tablet under the tongue daily.   folic acid (FOLVITE) 1 MG tablet Take 1 tablet (1 mg total) by mouth daily.   levothyroxine (SYNTHROID) 88 MCG tablet Take 1 tablet (88 mcg total) by mouth daily. And two pills once a week   pantoprazole (PROTONIX) 40 MG tablet Take 1 tablet (40 mg total) by mouth daily.   Semaglutide-Weight Management (WEGOVY) 2.4 MG/0.75ML SOAJ Inject 2.4 mg into the skin once a week.   No facility-administered medications prior to visit.    Review of Systems  Musculoskeletal:  Positive for arthralgias, gait problem, joint swelling and myalgias.  Neurological:  Positive for weakness (routinely feels like knee is going to give out) and numbness (chronic).       Objective    BP 128/80   Pulse 80   Temp 97.7 F (36.5 C) (Oral)   Resp 16   Ht 5\' 3"  (1.6 m)   Wt 276 lb 14.4 oz (125.6 kg)   LMP 08/04/2013   SpO2 97%   BMI 49.05 kg/m    Physical Exam Vitals reviewed.  Constitutional:      General: She is awake.  Appearance: Normal appearance. She is well-developed and well-groomed.  HENT:     Head: Normocephalic and atraumatic.  Pulmonary:     Effort: Pulmonary effort is normal.  Musculoskeletal:     Cervical back: Normal range of motion.     Right knee: Swelling and crepitus present. No deformity, effusion, erythema, ecchymosis or lacerations. Normal range of motion. Tenderness present over the medial joint line, MCL and LCL. Normal pulse.     Instability Tests: Anterior drawer test negative. Posterior drawer test negative. Medial McMurray test negative and lateral McMurray test negative.     Left knee: Normal.     Right lower leg: No swelling. No edema.     Left lower leg: No swelling. No edema.     Comments: Positive Apley grind test on Left knee Pain with testing MCL and LCL laxity but appear intact and stable   Neurological:     General:  No focal deficit present.     Mental Status: She is alert and oriented to person, place, and time. Mental status is at baseline.  Psychiatric:        Mood and Affect: Mood normal.        Behavior: Behavior normal. Behavior is cooperative.        Thought Content: Thought content normal.        Judgment: Judgment normal.       No results found for any visits on 09/20/22.  Assessment & Plan      No follow-ups on file.     Problem List Items Addressed This Visit   None Visit Diagnoses     Acute pain of right knee    -  Primary Acute, new concern Reports pain in medial joint line and medial aspect along MCL with some radiation along lateral aspect and up lateral thigh  Will order xray for initial evaluation but concern for meniscus injury or potential ligament injury with Apley grind and MCL discomfort so will also place order for MRI  Differential includes but not limited to: joint effusion, meniscus injury, MCL injury, baker's cyst  We discussed potential need for Ortho evaluation and that she may need arthrocentesis depending on results of imaging For now recommend Naproxen and Prednisone to assist with swelling and inflammation. Can use Tylenol for further pain management Recommend using a brace to help with stability and warm compresses to help with discomfort Follow up as dictated by imaging and specialty recommendations    Relevant Medications   naproxen (NAPROSYN) 500 MG tablet   predniSONE (DELTASONE) 20 MG tablet   Other Relevant Orders   DG Knee Complete 4 Views Right   Ambulatory referral to Orthopedics   MR Knee Right Wo Contrast        No follow-ups on file.   I, Tehila Sokolow E Quinta Eimer, PA-C, have reviewed all documentation for this visit. The documentation on 09/20/22 for the exam, diagnosis, procedures, and orders are all accurate and complete.   Talitha Givens, MHS, PA-C [facility]

## 2022-09-21 NOTE — Progress Notes (Signed)
Your Xray did not show evidence of fracture, dislocation or effusion at this time. I think we should proceed with the MRI for a better idea of what is going on and proceed with Orthopedic involvement. While waiting for these to be scheduled, please continue with the measures we discussed at your apt.

## 2022-09-26 ENCOUNTER — Ambulatory Visit
Admission: RE | Admit: 2022-09-26 | Discharge: 2022-09-26 | Disposition: A | Discharge status: Home / Self Care | Payer: BC Managed Care – PPO | Source: Ambulatory Visit | Attending: Physician Assistant | Admitting: Physician Assistant

## 2022-09-26 DIAGNOSIS — M25561 Pain in right knee: Secondary | ICD-10-CM

## 2022-10-03 NOTE — Progress Notes (Signed)
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 I recommend that you follow up with Orthopedics as surgery may be required to assist with this. For now I recommend using a rigid knee brace to stabilize your knee, using the NSAIDs that we discussed and applying warm/cool compresses, whichever one provides more relief.

## 2022-10-13 ENCOUNTER — Ambulatory Visit (INDEPENDENT_AMBULATORY_CARE_PROVIDER_SITE_OTHER): Payer: BC Managed Care – PPO | Admitting: Family Medicine

## 2022-10-13 VITALS — BP 126/76 | HR 94 | Temp 98.0°F | Resp 18 | Ht 63.75 in | Wt 274.7 lb

## 2022-10-13 DIAGNOSIS — J029 Acute pharyngitis, unspecified: Secondary | ICD-10-CM | POA: Diagnosis not present

## 2022-10-13 DIAGNOSIS — J069 Acute upper respiratory infection, unspecified: Secondary | ICD-10-CM | POA: Diagnosis not present

## 2022-10-13 DIAGNOSIS — R051 Acute cough: Secondary | ICD-10-CM

## 2022-10-13 LAB — POCT RAPID STREP A (OFFICE): Rapid Strep A Screen: NEGATIVE

## 2022-10-13 MED ORDER — HYDROCOD POLI-CHLORPHE POLI ER 10-8 MG/5ML PO SUER: 5.0000 mL | Freq: Two times a day (BID) | ORAL | 0 refills | Status: DC

## 2022-10-13 MED ORDER — BENZONATATE 100 MG PO CAPS: 100.0000 mg | ORAL_CAPSULE | Freq: Two times a day (BID) | ORAL | 0 refills | Status: DC | PRN

## 2022-10-13 NOTE — Progress Notes (Signed)
Name: Dana Whitaker   MRN: [id number]    DOB: Jan 01, 1976   Date:10/13/2022       Progress Note  Subjective  Chief Complaint  Exposure to Strep  HPI  URI: she states symptoms started a few days ago, her children have been sick for the past 2 weeks. She states initially started with a dry cough followed by a sore throat, she has fatigue, feels tired and clammy, lack of appetite, felt nauseated last night, no vomiting. 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/2016   Fatty liver 06/13/2016   Bilateral carpal tunnel syndrome 06/13/2016   Status post bariatric surgery 06/13/2016   Hypertriglyceridemia 05/19/2016   History of fracture of left ankle 05/19/2016   Former cigarette smoker 02/17/2016   Prediabetes 02/17/2016   Status post laparoscopic hysterectomy 11/10/2015   Morbid obesity with BMI of 50.0-59.9, adult 11/10/2015   Borderline personality disorder 01/13/2015   ADHD (attention deficit hyperactivity disorder), combined type 01/13/2015   Apnea, sleep 01/13/2015   Insomnia, persistent 01/13/2015   Depression, major, recurrent, moderate 01/13/2015   Fibromyalgia 01/13/2015   Generalized anxiety disorder 01/13/2015   Classical migraine with intractable migraine 09/12/2014   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    Past Surgical History:  Procedure Laterality Date   ABDOMINAL HYSTERECTOMY     [place].[email or web address]   CARPAL TUNNEL RELEASE     pt  states did not have surgery just injections    LAPAROSCOPIC GASTRIC SLEEVE RESECTION N/A 06/13/2016   Procedure: LAPAROSCOPIC GASTRIC SLEEVE RESECTION WITH HIATAL HERNIA REPAIR AND UPPER ENDOSCOPY;  Surgeon: Gaynelle Adu, MD;  Location: [facility];  Service: General;  Laterality: N/A;   LEEP     TONSILLECTOMY     VARICOSE VEIN SURGERY      Family History  Problem Relation Age of Onset   Hyperlipidemia Mother    Bipolar disorder Mother    Heart attack Father    Drug abuse Father    Anxiety disorder Sister    Depression Sister    Anxiety disorder Sister    Depression Sister    ADD / ADHD Sister    Alcohol abuse Sister    Thyroid disease Maternal Grandmother    Diabetes Maternal Grandmother    Stroke Maternal Grandmother    Ovarian cancer Neg Hx    Breast cancer Neg Hx    Colon cancer Neg Hx     Social History   Tobacco Use   Smoking status: Former    Packs/day: 1.00    Years: 23.00    Additional pack years: 0.00    Total pack years: 23.00    Types: Cigarettes    Start date: 02/05/1994    Quit date: 05/10/2016    Years since quitting: 6.4   Smokeless tobacco: Never  Substance Use Topics   Alcohol use: Yes    Alcohol/week: 0.0 standard drinks of alcohol    Comment: occas     Current Outpatient  Medications:    Cholecalciferol (VITAMIN D) 50 MCG (2000 UT) CAPS, Take 1 capsule (2,000 Units total) by mouth daily., Disp: 30 capsule, Rfl: 0   Cyanocobalamin (B-12) 1000 MCG SUBL, Place 1 tablet under the tongue daily., Disp: 100 tablet, Rfl: 1   folic acid (FOLVITE) 1 MG tablet, Take 1 tablet (1 mg total) by mouth daily., Disp: 100 tablet, Rfl: 1   levothyroxine (SYNTHROID) 88 MCG tablet, Take 1 tablet (88 mcg total) by mouth daily. And two pills once a week, Disp: 102 tablet, Rfl: 0   naproxen (NAPROSYN) 500 MG tablet, Take 1 tablet (500 mg total) by mouth 2 (two) times daily with a meal., Disp: 30 tablet, Rfl: 0   pantoprazole (PROTONIX) 40 MG tablet, Take 1 tablet (40 mg total)  by mouth daily., Disp: 90 tablet, Rfl: 1   predniSONE (DELTASONE) 20 MG tablet, Take 60mg  PO daily x 2 days, then40mg  PO daily x 2 days, then 20mg  PO daily x 3 days, Disp: 13 tablet, Rfl: 0   Semaglutide-Weight Management (WEGOVY) 2.4 MG/0.75ML SOAJ, Inject 2.4 mg into the skin once a week., Disp: 9 mL, Rfl: 0  Allergies  Allergen Reactions   Ciprofloxacin Hives   Tramadol Rash    I personally reviewed active problem list, medication list, allergies, family history, social history, health maintenance with the patient/caregiver today.   ROS  Ten systems reviewed and is negative except as mentioned in HPI   Objective  Vitals:   10/13/22 0932  BP: 126/76  Pulse: 94  Resp: 18  Temp: 98 F (36.7 C)  TempSrc: Oral  SpO2: 100%  Weight: 274 lb 11.2 oz (124.6 kg)  Height: 5' 3.75" (1.619 m)    Body mass index is 47.52 kg/m.  Physical Exam  Constitutional: Patient appears well-developed and well-nourished. Obese  No distress.  HEENT: head atraumatic, normocephalic, pupils equal and reactive to light, ears scar on left side , neck supple, throat within normal limits Cardiovascular: Normal rate, regular rhythm and normal heart sounds.  No murmur heard. No BLE edema. Pulmonary/Chest: Effort normal and breath sounds normal. No respiratory distress. Abdominal: Soft.  There is no tenderness. Psychiatric: Patient has a normal mood and affect. behavior is normal. Judgment and thought content normal.    PHQ2/9:    10/13/2022    9:34 AM 09/20/2022    9:44 AM 09/06/2022    7:53 AM 07/05/2022    7:50 AM 05/19/2022    9:51 AM  Depression screen PHQ 2/9  Decreased Interest 0 0 0 0 0  Down, Depressed, Hopeless 0 0 0 0 0  PHQ - 2 Score 0 0 0 0 0  Altered sleeping 0 0 3 0   Tired, decreased energy 0 0 3 0   Change in appetite 0 0 0 0   Feeling bad or failure about yourself  0 0 0 0   Trouble concentrating 0 0 0 0   Moving slowly or fidgety/restless 0 0 0 0   Suicidal thoughts 0 0 0 0    PHQ-9 Score 0 0 6 0   Difficult doing work/chores  Not difficult at all       phq 9 is negative   Fall Risk:    09/20/2022    9:44 AM 09/06/2022    7:53 AM 07/05/2022    7:49 AM 05/19/2022    9:51 AM 05/03/2022    3:38 PM  Fall Risk   Falls in the past year? 0 0 0 0 0  Number falls in past yr: 0 0 0 0   Injury with Fall? 0 0 0 0   Risk for fall due to : No Fall Risks No Fall Risks No Fall Risks  No Fall Risks  Follow up Falls prevention discussed;Education provided;Falls evaluation completed Falls prevention discussed Falls prevention discussed Falls evaluation completed Falls prevention discussed;Education provided;Falls evaluation completed      Functional Status Survey: Is the patient deaf or have difficulty hearing?: No Does the patient have difficulty seeing, even when wearing glasses/contacts?: No Does the patient have difficulty concentrating, remembering, or making decisions?: No Does the patient have difficulty walking or climbing stairs?: No Does the patient have difficulty dressing or bathing?: No Does the patient have difficulty doing errands alone such as visiting a doctor's office or shopping?: No    Assessment & Plan  1. Sore throat  - POCT rapid strep A  2. URI, acute  - chlorpheniramine-HYDROcodone (TUSSIONEX) 10-8 MG/5ML; Take 5 mLs by mouth 2 (two) times daily.  Dispense: 115 mL; Refill: 0  3. Acute cough  - chlorpheniramine-HYDROcodone (TUSSIONEX) 10-8 MG/5ML; Take 5 mLs by mouth 2 (two) times daily.  Dispense: 115 mL; Refill: 0 - benzonatate (TESSALON) 100 MG capsule; Take 1-2 capsules (100-200 mg total) by mouth 2 (two) times daily as needed.  Dispense: 40 capsule; Refill: 0

## 2022-11-13 ENCOUNTER — Other Ambulatory Visit: Payer: Self-pay | Admitting: Family Medicine

## 2022-11-13 ENCOUNTER — Other Ambulatory Visit: Payer: Self-pay | Admitting: Physician Assistant

## 2022-11-13 DIAGNOSIS — M25561 Pain in right knee: Secondary | ICD-10-CM

## 2022-11-14 MED ORDER — WEGOVY 2.4 MG/0.75ML ~~LOC~~ SOAJ: 2.4000 mg | SUBCUTANEOUS | 0 refills | Status: DC

## 2022-11-14 MED ORDER — NAPROXEN 500 MG PO TABS: 500.0000 mg | ORAL_TABLET | Freq: Two times a day (BID) | ORAL | 0 refills | Status: DC

## 2022-12-01 ENCOUNTER — Other Ambulatory Visit: Payer: Self-pay | Admitting: Family Medicine

## 2022-12-01 DIAGNOSIS — E039 Hypothyroidism, unspecified: Secondary | ICD-10-CM

## 2022-12-12 ENCOUNTER — Other Ambulatory Visit: Payer: Self-pay | Admitting: Family Medicine

## 2022-12-12 MED ORDER — WEGOVY 2.4 MG/0.75ML ~~LOC~~ SOAJ: 2.4000 mg | SUBCUTANEOUS | 0 refills | Status: DC

## 2022-12-16 NOTE — Progress Notes (Unsigned)
Name: Dana Whitaker   MRN: [id number]    DOB: [birth year]   Date:12/19/2022       Progress Note  Subjective  Chief Complaint  Follow Up  HPI  Dyslipidemia:   The 10-year ASCVD risk score (Arnett DK, et al., 2019) is: 1.3%   Values used to calculate the score:     Age: 47 years     Sex: Female     Is Non-Hispanic African American: No     Diabetic: No     Tobacco smoker: Yes     Systolic Blood Pressure: 122 mmHg     Is BP treated: No     HDL Cholesterol: 67 mg/dL     Total Cholesterol: 163 mg/dL    Hypothyroidism: she is now taking levothyroxine 88 mcg  most days of the week, she forgets at times, advised to try to take it when she first gets up in am, with B12 and vitamin D. Advised to not take B12 with thyroid medication.  We will recheck TSH since last level was towards high end of normal   Pre-diabetes: she states she has polyphagia, polydipsia but no polyuria.A1C was higher prior to her bariatric surgery in 2017 - it was 6.3 % . Last A1C was down to 5.8%   Morbid obesity: she had bariatric surgery Dec 2017, her weight was 298 lbs prior to surgery, she went as low as 230 lbs, but gradually her weight went up , she was stable around 270 lbs for about one year while on Keto diet. She saw Dr. Andrey Campanile and was  taking phentermine but she stopped taking because it was causing headaches and gained weight right back when she stopped taking it.Shona Simpson was too expensive. We tried Rybelsus but not covered by insurance. She started on  Wegovy 2.4 mg August 2023 at a weight of 305 lbs weight  today is down to 271 lbs   History of LGSIL and endometrium dysplasia, history of hysterectomy, under the care of Dr. Logan Bores, had a CPE July 2021 , repeat pap smear 2022 was also normal she states gyn is going to do pap smears every 2 years if also normal in 2023  Unchanged    Major depression/GAD/Borderline personality disorder : she was diagnosed by psychiatrist years ago.  She states she is  doing well emotionally, Phq9 is still positive ,  she stopped adderal on her own, caused dry mouth and felt anxious She does not like taking medications She has 3 children at home and one is hyperactive. They bought a house in January and they are now remodeling. Their relationship has improved since they bought the house together and are working towards the same goal   ADHD: son also has ADHD, diagnosed by Dr. Elesa Massed, she took Ritalin in the past. Vyvanse did not work well for her, she works as an Airline pilot, we tried Chief Operating Officer but not covered by Community education officer .We tried Adderal but it caused more anxiety and agitation . She does not want to take medications at this time  Unchanged    GERD: she has been taking Pantoprazole daily to control her symptoms. Unchanged  Chronic knee pain: seeing Ortho, has OA, meniscal tear, getting PT and taking naproxen, we will switch to celebrex to take up to bid with food    Patient Active Problem List   Diagnosis Date Noted   GERD without esophagitis 09/01/2020   Vaginal dysplasia 01/09/2018   LGSIL Pap smear of vagina 01/02/2018  Vaginal high risk HPV DNA test positive 01/02/2018   Hypothyroidism 11/28/2017   Urgency of urination 11/10/2016   Surgical menopause 11/10/2016   Cyst of left breast 08/30/2016   History of malignant neoplasm of endometrium 06/29/2016   HPV in female 06/21/2016   Fatty liver 06/13/2016   Bilateral carpal tunnel syndrome 06/13/2016   Status post bariatric surgery 06/13/2016   Hypertriglyceridemia 05/19/2016   History of fracture of left ankle 05/19/2016   Former cigarette smoker 02/17/2016   Prediabetes 02/17/2016   Status post laparoscopic hysterectomy 11/10/2015   Morbid obesity with BMI of 50.0-59.9, adult (HCC) 11/10/2015   Borderline personality disorder (HCC) 01/13/2015   ADHD (attention deficit hyperactivity disorder), combined type 01/13/2015   Apnea, sleep 01/13/2015   Insomnia, persistent 01/13/2015   Depression, major,  recurrent, moderate (HCC) 01/13/2015   Fibromyalgia 01/13/2015   Generalized anxiety disorder 01/13/2015   Classical migraine with intractable migraine 09/12/2014   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    Past Surgical History:  Procedure Laterality Date   ABDOMINAL HYSTERECTOMY     [email or web address]   CARPAL TUNNEL RELEASE     pt states did not have surgery just injections    LAPAROSCOPIC GASTRIC SLEEVE RESECTION N/A 06/13/2016   Procedure: LAPAROSCOPIC GASTRIC SLEEVE RESECTION WITH HIATAL HERNIA REPAIR AND UPPER ENDOSCOPY;  Surgeon: Gaynelle Adu, MD;  Location: [facility];  Service: General;  Laterality: N/A;   LEEP     TONSILLECTOMY     VARICOSE VEIN SURGERY      Family History  Problem Relation Age of Onset   Hyperlipidemia Mother    Bipolar disorder Mother    Heart attack Father    Drug abuse Father    Anxiety disorder Sister    Depression Sister    Anxiety disorder Sister    Depression Sister    ADD / ADHD Sister    Alcohol abuse Sister    Thyroid disease Maternal Grandmother    Diabetes Maternal Grandmother    Stroke Maternal Grandmother    Ovarian cancer Neg Hx    Breast cancer Neg Hx    Colon cancer Neg Hx     Social History   Tobacco Use   Smoking status: Former    Packs/day: 1.00    Years: 23.00    Additional pack years: 0.00    Total pack years: 23.00    Types: Cigarettes    Start date: 02/05/1994    Quit date: 05/10/2016    Years since quitting: 6.6   Smokeless tobacco: Never  Substance Use Topics   Alcohol use: Yes    Alcohol/week: 0.0 standard drinks of alcohol    Comment: occas     Current Outpatient Medications:    Cholecalciferol (VITAMIN D) 50 MCG (2000 UT) CAPS, Take 1 capsule (2,000 Units total) by mouth daily., Disp: 30 capsule, Rfl: 0   Cyanocobalamin (B-12) 1000 MCG SUBL, Place 1 tablet under the tongue daily., Disp: 100 tablet, Rfl: 1   folic acid (FOLVITE) 1 MG tablet, Take 1 tablet (1 mg total) by mouth daily., Disp: 100 tablet, Rfl: 1    levothyroxine (SYNTHROID) 88 MCG tablet, TAKE ONE TABLET ONCE A DAY AND TWO TABLETS ONCE A WEEK, Disp: 102 tablet, Rfl: 0   naproxen (NAPROSYN) 500 MG tablet, Take 1 tablet (500 mg total) by mouth 2 (two) times daily with a meal., Disp: 30 tablet, Rfl: 0   pantoprazole (PROTONIX) 40 MG tablet, Take 1 tablet (40 mg total) by mouth daily., Disp: 90 tablet, Rfl:  1   Semaglutide-Weight Management (WEGOVY) 2.4 MG/0.75ML SOAJ, Inject 2.4 mg into the skin once a week., Disp: 3 mL, Rfl: 0  Allergies  Allergen Reactions   Ciprofloxacin Hives   Tramadol Rash    I personally reviewed active problem list, medication list, allergies, family history, social history, health maintenance with the patient/caregiver today.   ROS  Ten systems reviewed and is negative except as mentioned in HPI   Objective  Vitals:   12/19/22 0744  BP: 122/78  Pulse: 89  Resp: 16  SpO2: 97%  Weight: 271 lb (122.9 kg)  Height: 5\' 3"  (1.6 m)    Body mass index is 48.01 kg/m.  Physical Exam  Constitutional: Patient appears well-developed and well-nourished. Obese  No distress.  HEENT: head atraumatic, normocephalic, pupils equal and reactive to light, neck supple Cardiovascular: Normal rate, regular rhythm and normal heart sounds.  No murmur heard. No BLE edema. Pulmonary/Chest: Effort normal and breath sounds normal. No respiratory distress. Abdominal: Soft.  There is no tenderness. Psychiatric: Patient has a normal mood and affect. behavior is normal. Judgment and thought content normal.   Recent Results (from the past 2160 hour(s))  POCT rapid strep A     Status: Normal   Collection Time: 10/13/22 10:09 AM  Result Value Ref Range   Rapid Strep A Screen Negative Negative    PHQ2/9:    12/19/2022    7:43 AM 10/13/2022    9:34 AM 09/20/2022    9:44 AM 09/06/2022    7:53 AM 07/05/2022    7:50 AM  Depression screen PHQ 2/9  Decreased Interest 0 0 0 0 0  Down, Depressed, Hopeless 0 0 0 0 0  PHQ - 2 Score 0  0 0 0 0  Altered sleeping 0 0 0 3 0  Tired, decreased energy 0 0 0 3 0  Change in appetite 0 0 0 0 0  Feeling bad or failure about yourself  0 0 0 0 0  Trouble concentrating 0 0 0 0 0  Moving slowly or fidgety/restless 0 0 0 0 0  Suicidal thoughts 0 0 0 0 0  PHQ-9 Score 0 0 0 6 0  Difficult doing work/chores   Not difficult at all      phq 9 is negative   Fall Risk:    12/19/2022    7:43 AM 09/20/2022    9:44 AM 09/06/2022    7:53 AM 07/05/2022    7:49 AM 05/19/2022    9:51 AM  Fall Risk   Falls in the past year? 0 0 0 0 0  Number falls in past yr: 0 0 0 0 0  Injury with Fall? 0 0 0 0 0  Risk for fall due to : No Fall Risks No Fall Risks No Fall Risks No Fall Risks   Follow up Falls prevention discussed Falls prevention discussed;Education provided;Falls evaluation completed Falls prevention discussed Falls prevention discussed Falls evaluation completed      Functional Status Survey: Is the patient deaf or have difficulty hearing?: No Does the patient have difficulty seeing, even when wearing glasses/contacts?: No Does the patient have difficulty concentrating, remembering, or making decisions?: No Does the patient have difficulty walking or climbing stairs?: Yes Does the patient have difficulty dressing or bathing?: No Does the patient have difficulty doing errands alone such as visiting a doctor's office or shopping?: No    Assessment & Plan  1. Mild recurrent major depression (HCC)  Stable   2. Morbid  obesity with BMI of 45.0-49.9, adult (HCC)  - Semaglutide-Weight Management (WEGOVY) 2.4 MG/0.75ML SOAJ; Inject 2.4 mg into the skin once a week.  Dispense: 9 mL; Refill: 0  3. GERD without esophagitis  - pantoprazole (PROTONIX) 40 MG tablet; Take 1 tablet (40 mg total) by mouth daily.  Dispense: 90 tablet; Refill: 1  4. Dyslipidemia   5. B12 deficiency  - folic acid (FOLVITE) 1 MG tablet; Take 1 tablet (1 mg total) by mouth daily.  Dispense: 100 tablet;  Refill: 1  6. Hypothyroidism (acquired)  - levothyroxine (SYNTHROID) 88 MCG tablet; Take 1 tablet (88 mcg total) by mouth daily before breakfast.  Dispense: 102 tablet; Refill: 0  7. Folic acid deficiency  - Cyanocobalamin (B-12) 1000 MCG SUBL; Place 1 tablet under the tongue daily.  Dispense: 100 tablet; Refill: 1  8. Vitamin D deficiency  - Cholecalciferol (VITAMIN D) 50 MCG (2000 UT) CAPS; Take 1 capsule (2,000 Units total) by mouth daily.  Dispense: 100 capsule; Refill: 1  9. Prediabetes   10. Attention deficit hyperactivity disorder (ADHD), predominantly inattentive type   11. Bilateral chronic knee pain  - celecoxib (CELEBREX) 100 MG capsule; Take 1 capsule (100 mg total) by mouth 2 (two) times daily.  Dispense: 180 capsule; Refill: 0

## 2022-12-19 ENCOUNTER — Ambulatory Visit (INDEPENDENT_AMBULATORY_CARE_PROVIDER_SITE_OTHER): Payer: BC Managed Care – PPO | Admitting: Family Medicine

## 2022-12-19 ENCOUNTER — Encounter: Payer: Self-pay | Admitting: Family Medicine

## 2022-12-19 VITALS — BP 122/78 | HR 89 | Resp 16 | Ht 63.0 in | Wt 271.0 lb

## 2022-12-19 DIAGNOSIS — E039 Hypothyroidism, unspecified: Secondary | ICD-10-CM

## 2022-12-19 DIAGNOSIS — M25561 Pain in right knee: Secondary | ICD-10-CM

## 2022-12-19 DIAGNOSIS — F330 Major depressive disorder, recurrent, mild: Secondary | ICD-10-CM

## 2022-12-19 DIAGNOSIS — Z6841 Body Mass Index (BMI) 40.0 and over, adult: Secondary | ICD-10-CM

## 2022-12-19 DIAGNOSIS — E559 Vitamin D deficiency, unspecified: Secondary | ICD-10-CM

## 2022-12-19 DIAGNOSIS — R7303 Prediabetes: Secondary | ICD-10-CM

## 2022-12-19 DIAGNOSIS — K219 Gastro-esophageal reflux disease without esophagitis: Secondary | ICD-10-CM | POA: Diagnosis not present

## 2022-12-19 DIAGNOSIS — E538 Deficiency of other specified B group vitamins: Secondary | ICD-10-CM

## 2022-12-19 DIAGNOSIS — E785 Hyperlipidemia, unspecified: Secondary | ICD-10-CM | POA: Diagnosis not present

## 2022-12-19 DIAGNOSIS — M25562 Pain in left knee: Secondary | ICD-10-CM

## 2022-12-19 DIAGNOSIS — G8929 Other chronic pain: Secondary | ICD-10-CM

## 2022-12-19 DIAGNOSIS — F900 Attention-deficit hyperactivity disorder, predominantly inattentive type: Secondary | ICD-10-CM

## 2022-12-19 MED ORDER — B-12 1000 MCG SL SUBL: 1.0000 | SUBLINGUAL_TABLET | Freq: Every day | SUBLINGUAL | 1 refills | Status: DC

## 2022-12-19 MED ORDER — LEVOTHYROXINE SODIUM 88 MCG PO TABS: 88.0000 ug | ORAL_TABLET | Freq: Every day | ORAL | 0 refills | Status: DC

## 2022-12-19 MED ORDER — WEGOVY 2.4 MG/0.75ML ~~LOC~~ SOAJ
2.4000 mg | SUBCUTANEOUS | 0 refills | Status: DC
Start: 2022-12-19 — End: 2023-03-21

## 2022-12-19 MED ORDER — PANTOPRAZOLE SODIUM 40 MG PO TBEC: 40.0000 mg | DELAYED_RELEASE_TABLET | Freq: Every day | ORAL | 1 refills | Status: DC

## 2022-12-19 MED ORDER — CELECOXIB 100 MG PO CAPS
100.0000 mg | ORAL_CAPSULE | Freq: Two times a day (BID) | ORAL | 0 refills | Status: DC
Start: 2022-12-19 — End: 2023-03-21

## 2022-12-19 MED ORDER — FOLIC ACID 1 MG PO TABS
1.0000 mg | ORAL_TABLET | Freq: Every day | ORAL | 1 refills | Status: DC
Start: 2022-12-19 — End: 2023-06-15

## 2022-12-19 MED ORDER — VITAMIN D 50 MCG (2000 UT) PO CAPS
1.0000 | ORAL_CAPSULE | Freq: Every day | ORAL | 1 refills | Status: AC
Start: 2022-12-19 — End: ?

## 2022-12-21 ENCOUNTER — Ambulatory Visit: Payer: BC Managed Care – PPO

## 2023-01-17 ENCOUNTER — Encounter: Payer: BC Managed Care – PPO | Admitting: Obstetrics and Gynecology

## 2023-01-19 ENCOUNTER — Other Ambulatory Visit (HOSPITAL_COMMUNITY)
Admission: RE | Admit: 2023-01-19 | Discharge: 2023-01-19 | Disposition: A | Discharge status: Home / Self Care | Payer: BC Managed Care – PPO | Source: Ambulatory Visit | Attending: Obstetrics and Gynecology | Admitting: Obstetrics and Gynecology

## 2023-01-19 ENCOUNTER — Ambulatory Visit (INDEPENDENT_AMBULATORY_CARE_PROVIDER_SITE_OTHER): Payer: BC Managed Care – PPO | Admitting: Obstetrics and Gynecology

## 2023-01-19 ENCOUNTER — Encounter: Payer: Self-pay | Admitting: Obstetrics and Gynecology

## 2023-01-19 VITALS — BP 120/74 | HR 71 | Ht 63.0 in | Wt 266.7 lb

## 2023-01-19 DIAGNOSIS — Z01419 Encounter for gynecological examination (general) (routine) without abnormal findings: Secondary | ICD-10-CM | POA: Insufficient documentation

## 2023-01-19 DIAGNOSIS — Z1272 Encounter for screening for malignant neoplasm of vagina: Secondary | ICD-10-CM

## 2023-01-19 DIAGNOSIS — N951 Menopausal and female climacteric states: Secondary | ICD-10-CM

## 2023-01-19 MED ORDER — ESTRADIOL 0.05 MG/24HR TD PTWK
0.0500 mg | MEDICATED_PATCH | TRANSDERMAL | 3 refills | Status: DC
Start: 2023-01-19 — End: 2024-03-11

## 2023-01-19 NOTE — Progress Notes (Signed)
HPI:      [name] is a 47 y.o. G1P1001 who LMP was Patient's last menstrual period was 08/04/2013.  Subjective:   She presents today for her annual examination.  She has a history of endometrial cancer and underwent hysterectomy with bilateral oophorectomy.  She was on hormones for a while but she says she stopped taking them 2 years ago. She has a history of LGSIL and is requesting a vaginal cuff Pap smear today. She is scheduled for a mammogram in the near future    Hx: The following portions of the patient's history were reviewed and updated as appropriate:             She  has a past medical history of ADHD (attention deficit hyperactivity disorder), Anxiety, Bipolar disorder (HCC), Breast pain, Depression, Eczema, Falls, Fatigue, Fibromyalgia, GERD (gastroesophageal reflux disease), Headache, History of bronchitis, History of sprain of both ankles, Hyperlipemia, Obesity, Postcoital bleeding, Pre-diabetes, Sleep apnea, Uterine cancer (HCC), Vaginal dysplasia, and Varicose veins of lower extremity. She does not have any pertinent problems on file. She  has a past surgical history that includes Tonsillectomy; Abdominal hysterectomy; LEEP; Carpal tunnel release; Varicose vein surgery; and Laparoscopic gastric sleeve resection (N/A, 06/13/2016). Her family history includes ADD / ADHD in her sister; Alcohol abuse in her sister; Anxiety disorder in her sister and sister; Bipolar disorder in her mother; Depression in her sister and sister; Diabetes in her maternal grandmother; Drug abuse in her father; Heart attack in her father; Hyperlipidemia in her mother; Stroke in her maternal grandmother; Thyroid disease in her maternal grandmother. She  reports that she quit smoking about 6 years ago. Her smoking use included cigarettes. She started smoking about 28 years ago. She has a 23 pack-year smoking history. She has never used smokeless tobacco. She reports current alcohol use. She  reports that she does not use drugs. She has a current medication list which includes the following prescription(s): celecoxib, vitamin d, b-12, estradiol, folic acid, levothyroxine, pantoprazole, and wegovy. She is allergic to ciprofloxacin and tramadol.       Review of Systems:  Review of Systems  Constitutional: Denied constitutional symptoms, night sweats, recent illness, fatigue, fever, insomnia and weight loss.  Eyes: Denied eye symptoms, eye pain, photophobia, vision change and visual disturbance.  Ears/Nose/Throat/Neck: Denied ear, nose, throat or neck symptoms, hearing loss, nasal discharge, sinus congestion and sore throat.  Cardiovascular: Denied cardiovascular symptoms, arrhythmia, chest pain/pressure, edema, exercise intolerance, orthopnea and palpitations.  Respiratory: Denied pulmonary symptoms, asthma, pleuritic pain, productive sputum, cough, dyspnea and wheezing.  Gastrointestinal: Denied, gastro-esophageal reflux, melena, nausea and vomiting.  Genitourinary: Denied genitourinary symptoms including symptomatic vaginal discharge, pelvic relaxation issues, and urinary complaints.  Musculoskeletal: Denied musculoskeletal symptoms, stiffness, swelling, muscle weakness and myalgia.  Dermatologic: Denied dermatology symptoms, rash and scar.  Neurologic: Denied neurology symptoms, dizziness, headache, neck pain and syncope.  Psychiatric: Denied psychiatric symptoms, anxiety and depression.  Endocrine: Denied endocrine symptoms including hot flashes and night sweats.   Meds:   Current Outpatient Medications on File Prior to Visit  Medication Sig Dispense Refill   celecoxib (CELEBREX) 100 MG capsule Take 1 capsule (100 mg total) by mouth 2 (two) times daily. 180 capsule 0   Cholecalciferol (VITAMIN D) 50 MCG (2000 UT) CAPS Take 1 capsule (2,000 Units total) by mouth daily. 100 capsule 1   Cyanocobalamin (B-12) 1000 MCG SUBL Place 1 tablet under the tongue daily. 100 tablet 1    folic acid (FOLVITE)  1 MG tablet Take 1 tablet (1 mg total) by mouth daily. 100 tablet 1   levothyroxine (SYNTHROID) 88 MCG tablet Take 1 tablet (88 mcg total) by mouth daily before breakfast. 102 tablet 0   pantoprazole (PROTONIX) 40 MG tablet Take 1 tablet (40 mg total) by mouth daily. 90 tablet 1   Semaglutide-Weight Management (WEGOVY) 2.4 MG/0.75ML SOAJ Inject 2.4 mg into the skin once a week. 9 mL 0   No current facility-administered medications on file prior to visit.     Objective:     Vitals:   01/19/23 0825  BP: 120/74  Pulse: 71    Filed Weights   01/19/23 0825  Weight: 266 lb 11.2 oz (121 kg)              Physical examination    Pelvic:   Vulva: Normal appearance.  No lesions.  Vagina: No lesions or abnormalities noted.  Support: Normal pelvic support.  Urethra No masses tenderness or scarring.  Meatus Normal size without lesions or prolapse.  Cervix: Surgically absent  Anus: Normal exam.  No lesions.  Perineum: Normal exam.  No lesions.        Bimanual   Uterus: Surgically absent  Adnexae: No masses.  Non-tender to palpation.  Cul-de-sac: Negative for abnormality.     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   Fatty liver 06/13/2016   Bilateral carpal tunnel syndrome 06/13/2016   Status post bariatric surgery 06/13/2016   Hypertriglyceridemia 05/19/2016   History of fracture of left ankle 05/19/2016   Former cigarette smoker 02/17/2016   Prediabetes 02/17/2016   Status post laparoscopic hysterectomy 11/10/2015   Morbid obesity with BMI of  50.0-59.9, adult (HCC) 11/10/2015   Borderline personality disorder (HCC) 01/13/2015   ADHD (attention deficit hyperactivity disorder), combined type 01/13/2015   Apnea, sleep 01/13/2015   Insomnia, persistent 01/13/2015   Depression, major, recurrent, moderate (HCC) 01/13/2015   Fibromyalgia 01/13/2015   Generalized anxiety disorder 01/13/2015   Classical migraine with intractable migraine 09/12/2014   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 Well woman exam with routine gynecological exam   2. Screening for vaginal cancer   3. Symptomatic menopausal or female climacteric states     Patient has had a bilateral oophorectomy and is thus in surgical menopause.  She has not been taking ERT recently.   Plan:            1.  Basic Screening Recommendations The basic screening recommendations for asymptomatic women were discussed with the patient during her visit.  The age-appropriate recommendations were discussed with her and the rational for the tests reviewed.  When I am informed by the patient that another primary care physician has previously obtained the age-appropriate tests and they are up-to-date, only outstanding tests are ordered and referrals given as necessary.  Abnormal results of tests will be discussed with her when all of her results are completed.  Routine preventative health maintenance measures emphasized: Exercise/Diet/Weight control, Tobacco Warnings, Alcohol/Substance use risks and Stress Management Pap of the vaginal cuff performed 2.  Importance of surgical menopause and use of ERT discussed in detail.  HRT I have discussed HRT with the patient in detail.  The risk/benefits of it were reviewed.  She understands that during menopause Estrogen decreases dramatically and that this results in an increased risk of cardiovascular disease as well as osteoporosis.  We have also discussed the fact that hot flashes often result from a decrease in Estrogen, and that by  replacing Estrogen, they can often be alleviated.  We have discussed skin, vaginal and urinary tract changes that may also take place from this drop in Estrogen.  Emotional changes have also been linked to Estrogen and we have briefly discussed this.  The benefits of HRT including decrease in hot flashes, vaginal dryness, and osteoporosis were discussed.  The emotional benefit and a possible change in her cardiovascular risk profile was also reviewed.  The risks associated with Hormone Replacement Therapy were also reviewed.  The use of unopposed Estrogen and its relationship to endometrial cancer was discussed.  The addition of Progesterone and its beneficial effect on endometrial cancer was also noted.  The fact that there has been no consistent definitive studies showing an increase in breast cancer in women who use HRT was discussed with the patient.  The possible side effects including breast tenderness, fluid retention, mood changes and vaginal bleeding were discussed.  The patient was informed that this is an elective medication and that she may choose not to take Hormone Replacement Therapy.  Literature on HRT was made available, and I believe that after answering all of the patient's questions.  Special emphasis on the WHI study, as well as several studies since that pertaining to the risks and benefits of estrogen replacement therapy were compared.  The possible limitations of these studies were discussed including the age stratification of the WHI study.  The possible increased role of Progesterone in these studies was discussed in detail.  Different types of hormone formulation and methods of taking hormone replacement therapy were discussed. Literature on HRT was made available, and I believe that after answering all of the patient's questions she has an adequate and informed understanding of the risks and benefits of HRT. Patient desires Climara patch Orders No orders of the defined types were  placed in this encounter.    Meds ordered this encounter  Medications   estradiol (CLIMARA - DOSED IN MG/24 HR) 0.05 mg/24hr patch    Sig: Place 1 patch (0.05 mg total) onto the skin once a week.    Dispense:  13 patch    Refill:  3           F/U  No follow-ups on file.  Elonda Husky, M.D. 01/19/2023 9:04 AM

## 2023-01-19 NOTE — Addendum Note (Signed)
Addended by: Loman Chroman on: 01/19/2023 09:15 AM   Modules accepted: Orders

## 2023-01-19 NOTE — Progress Notes (Signed)
Patients presents for annual exam today. She states doing well, would like a vaginal cuff pap today due to her history. Mammogram scheduled for 02/06/23. Patients annual labs recently completed.

## 2023-01-23 LAB — CYTOLOGY - PAP
Comment: NEGATIVE
Diagnosis: NEGATIVE
High risk HPV: NEGATIVE

## 2023-01-26 ENCOUNTER — Encounter (HOSPITAL_COMMUNITY): Payer: Self-pay | Admitting: *Deleted

## 2023-02-06 ENCOUNTER — Ambulatory Visit
Admission: RE | Admit: 2023-02-06 | Discharge: 2023-02-06 | Disposition: A | Discharge status: Home / Self Care | Payer: BC Managed Care – PPO | Source: Ambulatory Visit | Attending: Family Medicine | Admitting: Family Medicine

## 2023-02-06 DIAGNOSIS — Z1231 Encounter for screening mammogram for malignant neoplasm of breast: Secondary | ICD-10-CM | POA: Diagnosis not present

## 2023-03-20 NOTE — Progress Notes (Unsigned)
Name: Dana Whitaker   MRN: [id number]    DOB: Oct 07, 1975   Date:03/21/2023       Progress Note  Subjective  Chief Complaint  Follow Up  HPI  Dyslipidemia:   The 10-year ASCVD risk score (Arnett DK, et al., 2019) is: 1.4%   Values used to calculate the score:     Age: 47 years     Sex: Female     Is Non-Hispanic African American: No     Diabetic: No     Tobacco smoker: Yes     Systolic Blood Pressure: 120 mmHg     Is BP treated: No     HDL Cholesterol: 67 mg/dL     Total Cholesterol: 163 mg/dL   Hypothyroidism: she is now taking levothyroxine 88 mcg  most days of the week, she forgets at times , she does not want to repeat labs today   Pre-diabetes: she states she has polyphagia, polydipsia but no polyuria.A1C was higher prior to her bariatric surgery in 2017 - it was 6.3 % . Last A1C was down to 5.8%. She is on Wegovy    Morbid obesity: she had bariatric surgery Dec 2017, her weight was 298 lbs prior to surgery, she went as low as 230 lbs, but gradually her weight went up , she was stable around 270 lbs for about one year while on Keto diet. She saw Dr. Andrey Campanile and was  taking phentermine but she stopped taking because it was causing headaches and gained weight right back when she stopped taking it.Shona Simpson was too expensive. We tried Rybelsus but not covered by insurance. She started on  Wegovy 2.4 mg August 2023 at a weight of 305 lbs weight  today is 265 lbs , she asked about Zepbound advised her to contact insurance to find out if it is covered by insurance   History of LGSIL and endometrium dysplasia, history of hysterectomy, under the care of Dr. Logan Bores, had a CPE July 2021 , repeat pap smear 2022 was also normal she states gyn is going to do pap smears every 2 years if also normal in 2024. He gave her Climara patch, but she developed acne and did not feel right on the medication    Major depression/GAD/Borderline personality disorder : she was diagnosed by psychiatrist  years ago.  She states she is doing well emotionally, Phq9 is still positive ,  she stopped adderal on her own, caused dry mouth and felt anxious She does not like taking medications She has 3 children at home and one is hyperactive. They bought a house in January and they are now remodeling. Their relationship has improved since they bought the house together and are working towards the same goal   ADHD: son also has ADHD, diagnosed by Dr. Elesa Massed, she took Ritalin in the past. Vyvanse did not work well for her, she works as an Airline pilot, we tried Chief Operating Officer but not covered by Community education officer .We tried Adderal but it caused more anxiety and agitation . She does not want to take medications at this time.Unchanged    GERD: she has been taking Pantoprazole daily and symptoms are controlled   Chronic knee pain: seeing Ortho, has OA, meniscal tear, getting PT and is now taking celebrex bid   Left toe injury: she is moving and dropped a TV mount on her left little toe on 03/19/2023 , very sore and ecchymotic.    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/21/2016   Fatty liver 06/13/2016   Bilateral carpal tunnel syndrome 06/13/2016   Status post bariatric surgery 06/13/2016   Hypertriglyceridemia 05/19/2016   History of fracture of left ankle 05/19/2016   Former cigarette smoker 02/17/2016   Prediabetes 02/17/2016   Status post laparoscopic hysterectomy 11/10/2015   Morbid obesity with BMI of 50.0-59.9, adult (HCC) 11/10/2015   Borderline personality disorder (HCC) 01/13/2015   ADHD (attention deficit hyperactivity disorder), combined  type 01/13/2015   Apnea, sleep 01/13/2015   Insomnia, persistent 01/13/2015   Depression, major, recurrent, moderate (HCC) 01/13/2015   Fibromyalgia 01/13/2015   Generalized anxiety disorder 01/13/2015   Classical migraine with intractable migraine 09/12/2014   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    Past Surgical History:  Procedure Laterality Date   ABDOMINAL HYSTERECTOMY     [place].[email or web address]   CARPAL TUNNEL RELEASE     pt states did not have surgery just injections    LAPAROSCOPIC GASTRIC SLEEVE RESECTION N/A 06/13/2016   Procedure: LAPAROSCOPIC GASTRIC SLEEVE RESECTION WITH HIATAL HERNIA REPAIR AND UPPER ENDOSCOPY;  Surgeon: Gaynelle Adu, MD;  Location: [facility];  Service: General;  Laterality: N/A;   LEEP     TONSILLECTOMY     VARICOSE VEIN SURGERY      Family History  Problem Relation Age of Onset   Hyperlipidemia Mother    Bipolar disorder Mother    Heart attack Father    Drug abuse Father    Anxiety disorder Sister    Depression Sister    Anxiety disorder Sister    Depression Sister    ADD / ADHD Sister    Alcohol abuse Sister    Thyroid disease Maternal Grandmother    Diabetes Maternal Grandmother    Stroke Maternal Grandmother    Ovarian cancer Neg Hx    Breast cancer Neg Hx    Colon cancer Neg Hx     Social History   Tobacco Use   Smoking status: Former    Current packs/day: 0.00    Average packs/day: 1 pack/day for 23.0 years (23.0 ttl pk-yrs)    Types: Cigarettes    Start date: 02/05/1994    Quit date: 05/10/2016    Years since quitting: 6.8   Smokeless tobacco: Never  Substance Use Topics   Alcohol use: Yes    Alcohol/week: 0.0 standard drinks of alcohol    Comment: occas     Current Outpatient Medications:    celecoxib (CELEBREX) 100 MG capsule, Take 1 capsule (100 mg total) by mouth 2 (two) times daily., Disp: 180 capsule, Rfl: 0   Cholecalciferol (VITAMIN D) 50 MCG (2000 UT) CAPS, Take 1 capsule (2,000 Units total) by mouth daily., Disp: 100  capsule, Rfl: 1   Cyanocobalamin (B-12) 1000 MCG SUBL, Place 1 tablet under the tongue daily., Disp: 100 tablet, Rfl: 1   estradiol (CLIMARA - DOSED IN MG/24 HR) 0.05 mg/24hr patch, Place 1 patch (0.05 mg total) onto the skin once a week., Disp: 13 patch, Rfl: 3   folic acid (FOLVITE) 1 MG tablet, Take 1 tablet (1 mg total) by mouth daily., Disp: 100 tablet, Rfl: 1   levothyroxine (SYNTHROID)  88 MCG tablet, Take 1 tablet (88 mcg total) by mouth daily before breakfast., Disp: 102 tablet, Rfl: 0   pantoprazole (PROTONIX) 40 MG tablet, Take 1 tablet (40 mg total) by mouth daily., Disp: 90 tablet, Rfl: 1   Semaglutide-Weight Management (WEGOVY) 2.4 MG/0.75ML SOAJ, Inject 2.4 mg into the skin once a week., Disp: 9 mL, Rfl: 0  Allergies  Allergen Reactions   Ciprofloxacin Hives   Tramadol Rash    I personally reviewed active problem list, medication list, allergies, family history, social history, health maintenance with the patient/caregiver today.   ROS  Ten systems reviewed and is negative except as mentioned in HPI    Objective  Vitals:   03/21/23 0804  BP: 120/72  Pulse: 91  Resp: 16  SpO2: 96%  Weight: 265 lb (120.2 kg)  Height: 5\' 3"  (1.6 m)    Body mass index is 46.94 kg/m.  Physical Exam  Constitutional: Patient appears well-developed and well-nourished. Obese No distress.  HEENT: head atraumatic, normocephalic, pupils equal and reactive to light, neck supple Cardiovascular: Normal rate, regular rhythm and normal heart sounds.  No murmur heard. No BLE edema. Pulmonary/Chest: Effort normal and breath sounds normal. No respiratory distress. Muscular skeletal: ecchymosis of left 5 th toe, pain only on distal toe  Abdominal: Soft.  There is no tenderness. Psychiatric: Patient has a normal mood and affect. behavior is normal. Judgment and thought content normal.   Recent Results (from the past 2160 hour(s))  Cytology - PAP     Status: None   Collection Time: 01/19/23   9:15 AM  Result Value Ref Range   High risk HPV Negative    Adequacy Satisfactory for evaluation.    Diagnosis      - Negative for intraepithelial lesion or malignancy (NILM)   Comment Normal Reference Range HPV - Negative     PHQ2/9:    03/21/2023    8:05 AM 12/19/2022    7:43 AM 10/13/2022    9:34 AM 09/20/2022    9:44 AM 09/06/2022    7:53 AM  Depression screen PHQ 2/9  Decreased Interest 0 0 0 0 0  Down, Depressed, Hopeless 0 0 0 0 0  PHQ - 2 Score 0 0 0 0 0  Altered sleeping 0 0 0 0 3  Tired, decreased energy 0 0 0 0 3  Change in appetite 0 0 0 0 0  Feeling bad or failure about yourself  0 0 0 0 0  Trouble concentrating 0 0 0 0 0  Moving slowly or fidgety/restless 0 0 0 0 0  Suicidal thoughts 0 0 0 0 0  PHQ-9 Score 0 0 0 0 6  Difficult doing work/chores    Not difficult at all     phq 9 is negative   Fall Risk:    03/21/2023    8:05 AM 12/19/2022    7:43 AM 09/20/2022    9:44 AM 09/06/2022    7:53 AM 07/05/2022    7:49 AM  Fall Risk   Falls in the past year? 0 0 0 0 0  Number falls in past yr: 0 0 0 0 0  Injury with Fall? 0 0 0 0 0  Risk for fall due to : No Fall Risks No Fall Risks No Fall Risks No Fall Risks No Fall Risks  Follow up Falls prevention discussed Falls prevention discussed Falls prevention discussed;Education provided;Falls evaluation completed Falls prevention discussed Falls prevention discussed      Functional Status  Survey: Is the patient deaf or have difficulty hearing?: No Does the patient have difficulty seeing, even when wearing glasses/contacts?: No Does the patient have difficulty concentrating, remembering, or making decisions?: No Does the patient have difficulty walking or climbing stairs?: No Does the patient have difficulty dressing or bathing?: No Does the patient have difficulty doing errands alone such as visiting a doctor's office or shopping?: No    Assessment & Plan  1. Pain in toe of left foot  - DG Toe 5th Left;  Future  2. Morbid obesity with BMI of 45.0-49.9, adult (HCC)  - Semaglutide-Weight Management (WEGOVY) 2.4 MG/0.75ML SOAJ; Inject 2.4 mg into the skin once a week.  Dispense: 9 mL; Refill: 0  3. Major depression in Remission (HCC)  Doing well at this time, even though raising 3 kids and moving   4. Dyslipidemia  On life style modification   5. B12 deficiency  Must take supplementation   6. Hypothyroidism (acquired)  - levothyroxine (SYNTHROID) 88 MCG tablet; Take 1 tablet (88 mcg total) by mouth daily before breakfast.  Dispense: 102 tablet; Refill: 0  7. Attention deficit hyperactivity disorder (ADHD), predominantly inattentive type  Unchanged   8. Bilateral chronic knee pain  - celecoxib (CELEBREX) 100 MG capsule; Take 1 capsule (100 mg total) by mouth 2 (two) times daily.  Dispense: 180 capsule; Refill: 0

## 2023-03-21 ENCOUNTER — Ambulatory Visit
Admission: RE | Admit: 2023-03-21 | Discharge: 2023-03-21 | Disposition: A | Discharge status: Home / Self Care | Payer: BC Managed Care – PPO | Source: Ambulatory Visit | Attending: Family Medicine | Admitting: Family Medicine

## 2023-03-21 ENCOUNTER — Ambulatory Visit
Admission: RE | Admit: 2023-03-21 | Discharge: 2023-03-21 | Disposition: A | Discharge status: Home / Self Care | Payer: BC Managed Care – PPO | Attending: Family Medicine | Admitting: Family Medicine

## 2023-03-21 ENCOUNTER — Ambulatory Visit: Payer: BC Managed Care – PPO | Admitting: Family Medicine

## 2023-03-21 ENCOUNTER — Encounter: Payer: Self-pay | Admitting: Family Medicine

## 2023-03-21 VITALS — BP 120/72 | HR 91 | Resp 16 | Ht 63.0 in | Wt 265.0 lb

## 2023-03-21 DIAGNOSIS — Z23 Encounter for immunization: Secondary | ICD-10-CM

## 2023-03-21 DIAGNOSIS — E538 Deficiency of other specified B group vitamins: Secondary | ICD-10-CM

## 2023-03-21 DIAGNOSIS — Z6841 Body Mass Index (BMI) 40.0 and over, adult: Secondary | ICD-10-CM

## 2023-03-21 DIAGNOSIS — M79675 Pain in left toe(s): Secondary | ICD-10-CM | POA: Diagnosis present

## 2023-03-21 DIAGNOSIS — E039 Hypothyroidism, unspecified: Secondary | ICD-10-CM

## 2023-03-21 DIAGNOSIS — E785 Hyperlipidemia, unspecified: Secondary | ICD-10-CM

## 2023-03-21 DIAGNOSIS — M25561 Pain in right knee: Secondary | ICD-10-CM

## 2023-03-21 DIAGNOSIS — M25562 Pain in left knee: Secondary | ICD-10-CM

## 2023-03-21 DIAGNOSIS — G8929 Other chronic pain: Secondary | ICD-10-CM

## 2023-03-21 DIAGNOSIS — F325 Major depressive disorder, single episode, in full remission: Secondary | ICD-10-CM

## 2023-03-21 DIAGNOSIS — F900 Attention-deficit hyperactivity disorder, predominantly inattentive type: Secondary | ICD-10-CM

## 2023-03-21 MED ORDER — CELECOXIB 100 MG PO CAPS
100.0000 mg | ORAL_CAPSULE | Freq: Two times a day (BID) | ORAL | 0 refills | Status: DC
Start: 2023-03-21 — End: 2024-03-11

## 2023-03-21 MED ORDER — WEGOVY 2.4 MG/0.75ML ~~LOC~~ SOAJ
2.4000 mg | SUBCUTANEOUS | 0 refills | Status: DC
Start: 2023-03-21 — End: 2023-06-15

## 2023-03-21 MED ORDER — LEVOTHYROXINE SODIUM 88 MCG PO TABS
88.0000 ug | ORAL_TABLET | Freq: Every day | ORAL | 0 refills | Status: DC
Start: 2023-03-21 — End: 2023-06-15

## 2023-03-21 NOTE — Addendum Note (Signed)
Addended by: Dollene Primrose on: 03/21/2023 08:25 AM   Modules accepted: Orders

## 2023-04-12 ENCOUNTER — Ambulatory Visit (INDEPENDENT_AMBULATORY_CARE_PROVIDER_SITE_OTHER): Payer: BC Managed Care – PPO | Admitting: Physician Assistant

## 2023-04-12 ENCOUNTER — Encounter: Payer: Self-pay | Admitting: Physician Assistant

## 2023-04-12 VITALS — BP 96/76 | HR 97 | Ht 63.0 in | Wt 263.6 lb

## 2023-04-12 DIAGNOSIS — J029 Acute pharyngitis, unspecified: Secondary | ICD-10-CM | POA: Diagnosis not present

## 2023-04-12 LAB — POC COVID19 BINAXNOW: SARS Coronavirus 2 Ag: NEGATIVE

## 2023-04-12 LAB — POCT INFLUENZA A/B
Influenza A, POC: NEGATIVE
Influenza B, POC: NEGATIVE

## 2023-04-12 NOTE — Progress Notes (Unsigned)
Established patient visit  Patient: Dana Whitaker   DOB: 17-Feb-1976   47 y.o. Female  MRN: [id number] Visit Date: 04/12/2023  Today's healthcare provider: Debera Lat, PA-C   Chief Complaint  Patient presents with   Medical Management of Chronic Issues    Sore throat, body aches, headaches, symptoms started Sunday night, felt worse on Monday, no medication prior    Subjective     Discussed the use of AI scribe software for clinical note transcription with the patient, who gave verbal consent to proceed.  History of Present Illness               04/12/2023    1:52 PM 03/21/2023    8:05 AM 12/19/2022    7:43 AM  Depression screen PHQ 2/9  Decreased Interest 0 0 0  Down, Depressed, Hopeless 0 0 0  PHQ - 2 Score 0 0 0  Altered sleeping 1 0 0  Tired, decreased energy 1 0 0  Change in appetite 1 0 0  Feeling bad or failure about yourself  0 0 0  Trouble concentrating 0 0 0  Moving slowly or fidgety/restless 0 0 0  Suicidal thoughts 0 0 0  PHQ-9 Score 3 0 0      10 /03/2023    1:52 PM 10/13/2022    9:35 AM 05/03/2022    3:39 PM 01/10/2022   11:47 AM  GAD 7 : Generalized Anxiety Score  Nervous, Anxious, on Edge 0 0 1 0  Control/stop worrying 0 0 0 0  Worry too much - different things 0 0 1 0  Trouble relaxing 0 0 0 0  Restless 0 0 1 0  Easily annoyed or irritable 0 0 1 3  Afraid - awful might happen 0 0 0 0  Total GAD 7 Score 0 0 4 3  Anxiety Difficulty    Very difficult    Medications: Outpatient Medications Prior to Visit  Medication Sig   celecoxib (CELEBREX) 100 MG capsule Take 1 capsule (100 mg total) by mouth 2 (two) times daily.   Cholecalciferol (VITAMIN D) 50 MCG (2000 UT) CAPS Take 1 capsule (2,000 Units total) by mouth daily.   Cyanocobalamin (B-12) 1000 MCG SUBL Place 1 tablet under the tongue daily.   estradiol (CLIMARA - DOSED IN MG/24 HR) 0.05 mg/24hr patch Place 1 patch (0.05 mg total) onto the skin once a week.   folic acid (FOLVITE) 1  MG tablet Take 1 tablet (1 mg total) by mouth daily.   levothyroxine (SYNTHROID) 88 MCG tablet Take 1 tablet (88 mcg total) by mouth daily before breakfast.   pantoprazole (PROTONIX) 40 MG tablet Take 1 tablet (40 mg total) by mouth daily.   Semaglutide-Weight Management (WEGOVY) 2.4 MG/0.75ML SOAJ Inject 2.4 mg into the skin once a week.   No facility-administered medications prior to visit.    Review of Systems  All other systems reviewed and are negative.  Except see HPI   {Insert previous labs (optional):23779} {See past labs  Heme  Chem  Endocrine  Serology  Results Review (optional):1}   Objective    BP 96/76 (BP Location: Right Arm, Patient Position: Sitting, Cuff Size: Large)   Pulse 97   Ht 5\' 3"  (1.6 m)   Wt 263 lb 9.6 oz (119.6 kg)   LMP 08/04/2013   SpO2 99%   BMI 46.69 kg/m  {Insert last BP/Wt (optional):23777}{See vitals history (optional):1}   Physical Exam Vitals reviewed.  Constitutional:  General: She is not in acute distress.    Appearance: Normal appearance. She is well-developed. She is not diaphoretic.  HENT:     Head: Normocephalic and atraumatic.  Eyes:     General: No scleral icterus.    Conjunctiva/sclera: Conjunctivae normal.  Neck:     Thyroid: No thyromegaly.  Cardiovascular:     Rate and Rhythm: Normal rate and regular rhythm.     Pulses: Normal pulses.     Heart sounds: Normal heart sounds. No murmur heard. Pulmonary:     Effort: Pulmonary effort is normal. No respiratory distress.     Breath sounds: Normal breath sounds. No wheezing, rhonchi or rales.  Musculoskeletal:     Cervical back: Neck supple.     Right lower leg: No edema.     Left lower leg: No edema.  Lymphadenopathy:     Cervical: No cervical adenopathy.  Skin:    General: Skin is warm and dry.     Findings: No rash.  Neurological:     Mental Status: She is alert and oriented to person, place, and time. Mental status is at baseline.  Psychiatric:         Mood and Affect: Mood normal.        Behavior: Behavior normal.      Results for orders placed or performed in visit on 04/12/23  POC COVID-19  Result Value Ref Range   SARS Coronavirus 2 Ag Negative Negative  POCT Influenza A/B  Result Value Ref Range   Influenza A, POC Negative Negative   Influenza B, POC Negative Negative    Assessment & Plan    1. Sore throat  Increase fluids.  Rest.  Saline nasal spray.  Probiotic.  Mucinex as directed.  Humidifier in bedroom. Flonase per orders.   - POC COVID-19 - POCT rapid strep A - POCT Influenza A/B       No follow-ups on file.     The patient was advised to call back or seek an in-person evaluation if the symptoms worsen or if the condition fails to improve as anticipated.  I discussed the assessment and treatment plan with the patient. The patient was provided an opportunity to ask questions and all were answered. The patient agreed with the plan and demonstrated an understanding of the instructions.  I, Debera Lat, PA-C have reviewed all documentation for this visit. The documentation on  04/12/23  for the exam, diagnosis, procedures, and orders are all accurate and complete.  Debera Lat, [facility], MMS [facility] [phone] (phone) [phone] (fax)  [facility]

## 2023-04-13 ENCOUNTER — Other Ambulatory Visit: Payer: Self-pay | Admitting: Family Medicine

## 2023-04-13 DIAGNOSIS — B349 Viral infection, unspecified: Secondary | ICD-10-CM

## 2023-04-13 LAB — POCT RAPID STREP A (OFFICE): Rapid Strep A Screen: NEGATIVE

## 2023-06-07 NOTE — Progress Notes (Signed)
Name: Dana Whitaker   MRN: [id number]    DOB: January 14, 1976   Date:06/15/2023       Progress Note  Subjective  Chief Complaint  Chief Complaint  Patient presents with   Medical Management of Chronic Issues    HPI  Discussed the use of AI scribe software for clinical note transcription with the patient, who gave verbal consent to proceed.  History of Present Illness   The patient, with a history of major depressive disorder, generalized anxiety disorder, ADHD, hypothyroidism, prediabetes, and obesity post-bariatric surgery, presents for a three-month follow-up. She reports a recent weight gain, attributing it to dietary indulgence over the holiday season. Despite this, she continues to take [facility] for weight loss and expresses a desire to return to a downward weight trend.  The patient also reports increased urinary frequency and a persistent sensation of hunger, raising concerns about her prediabetic status. She is currently not on any medication for her mental health conditions but expresses significant stress and frustration related to the holiday season, particularly financial strain and societal expectations around gift-giving. Despite this, her PHQ-9 score remains within the normal range.  She has a history of abnormal Pap smears and has undergone a hysterectomy. She was prescribed Climara but has not been consistent in its use due to concerns about hormonal changes and acne. She also has a history of osteoarthritis and has been prescribed Celebrex, which she takes as needed.  The patient has hypothyroidism and is on levothyroxine. She reports occasional forgetfulness in taking her medication and has been advised to take it alone, separate from her other medications. She also takes vitamin D, vitamin B12, and folic acid supplements, the latter being particularly important due to a previous deficiency.   Lastly, the patient has a history of bariatric surgery and has struggled with  weight regain post-surgery. She has tried various weight loss medications and diets, including keto, with varying degrees of success. She is currently on Wegovy for weight loss and has seen some success, though recent weight gain has been a concern. She expresses a desire to continue with [facility]         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/2016   Fatty liver 06/13/2016   Bilateral carpal tunnel syndrome 06/13/2016   Status post bariatric surgery 06/13/2016   Hypertriglyceridemia 05/19/2016   History of fracture of left ankle 05/19/2016   Former cigarette smoker 02/17/2016   Prediabetes 02/17/2016   Status post laparoscopic hysterectomy 11/10/2015   Morbid obesity with BMI of 50.0-59.9, adult (HCC) 11/10/2015   Borderline personality disorder (HCC) 01/13/2015   ADHD (attention deficit hyperactivity disorder), combined type 01/13/2015   Apnea, sleep 01/13/2015   Insomnia, persistent 01/13/2015   Depression, major, recurrent, moderate (HCC) 01/13/2015   Fibromyalgia 01/13/2015   Generalized anxiety disorder 01/13/2015   Classical migraine with intractable migraine 09/12/2014   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    Past Surgical History:  Procedure Laterality Date   ABDOMINAL HYSTERECTOMY     tah.bso   CARPAL TUNNEL RELEASE     pt states did not have surgery just  injections    LAPAROSCOPIC GASTRIC SLEEVE RESECTION N/A 06/13/2016   Procedure: LAPAROSCOPIC GASTRIC SLEEVE RESECTION WITH HIATAL HERNIA REPAIR AND UPPER ENDOSCOPY;  Surgeon: Gaynelle Adu, MD;  Location: [facility];  Service: General;   Laterality: N/A;   LEEP     TONSILLECTOMY     VARICOSE VEIN SURGERY      Family History  Problem Relation Age of Onset   Hyperlipidemia Mother    Bipolar disorder Mother    Heart attack Father    Drug abuse Father    Anxiety disorder Sister    Depression Sister    Anxiety disorder Sister    Depression Sister    ADD / ADHD Sister    Alcohol abuse Sister    Thyroid disease Maternal Grandmother    Diabetes Maternal Grandmother    Stroke Maternal Grandmother    Ovarian cancer Neg Hx    Breast cancer Neg Hx    Colon cancer Neg Hx     Social History   Tobacco Use   Smoking status: Former    Current packs/day: 0.00    Average packs/day: 1 pack/day for 23.0 years (23.0 ttl pk-yrs)    Types: Cigarettes    Start date: 02/05/1994    Quit date: 05/10/2016    Years since quitting: 7.1   Smokeless tobacco: Never  Substance Use Topics   Alcohol use: Yes    Alcohol/week: 0.0 standard drinks of alcohol    Comment: occas     Current Outpatient Medications:    celecoxib (CELEBREX) 100 MG capsule, Take 1 capsule (100 mg total) by mouth 2 (two) times daily., Disp: 180 capsule, Rfl: 0   Cholecalciferol (VITAMIN D) 50 MCG (2000 UT) CAPS, Take 1 capsule (2,000 Units total) by mouth daily., Disp: 100 capsule, Rfl: 1   Cyanocobalamin (B-12) 1000 MCG SUBL, Place 1 tablet under the tongue daily., Disp: 100 tablet, Rfl: 1   estradiol (CLIMARA - DOSED IN MG/24 HR) 0.05 mg/24hr patch, Place 1 patch (0.05 mg total) onto the skin once a week., Disp: 13 patch, Rfl: 3   folic acid (FOLVITE) 1 MG tablet, Take 1 tablet (1 mg total) by mouth daily., Disp: 100 tablet, Rfl: 1   levothyroxine (SYNTHROID) 88 MCG tablet, Take 1 tablet (88 mcg total) by mouth daily before breakfast., Disp: 102 tablet, Rfl: 0   pantoprazole (PROTONIX) 40 MG tablet, Take 1 tablet (40 mg total) by mouth daily., Disp: 90 tablet, Rfl: 1   Semaglutide-Weight Management (WEGOVY) 2.4 MG/0.75ML SOAJ, Inject 2.4 mg into the skin once a  week., Disp: 9 mL, Rfl: 0  Allergies  Allergen Reactions   Ciprofloxacin Hives   Tramadol Rash    I personally reviewed active problem list, medication list, allergies, family history with the patient/caregiver today.   ROS  Ten systems reviewed and is negative except as mentioned in HPI    Objective  Vitals:   06/15/23 0747  BP: 124/76  Pulse: 86  Resp: 16  Temp: 97.9 F (36.6 C)  TempSrc: Oral  SpO2: 96%  Weight: 271 lb 8 oz (123.2 kg)  Height: 5\' 3"  (1.6 m)    Body mass index is 48.09 kg/m.  Physical Exam  Constitutional: Patient appears well-developed and well-nourished. Obese  No distress.  HEENT: head atraumatic, normocephalic, pupils equal and reactive to light, neck supple Cardiovascular: Normal rate, regular rhythm and normal heart sounds.  No murmur heard. No BLE edema. Pulmonary/Chest: Effort normal and breath sounds normal. No respiratory distress. Abdominal: Soft.  There is no tenderness. Psychiatric: Patient has a normal mood and affect. behavior is normal. Judgment and thought content normal.   Recent Results (from the past 2160 hours)  POC COVID-19     Status: None   Collection Time: 04/12/23  2:18 PM  Result Value Ref Range   SARS Coronavirus 2 Ag Negative Negative  POCT Influenza A/B     Status: None   Collection Time: 04/12/23  2:18 PM  Result Value Ref Range   Influenza A, POC Negative Negative   Influenza B, POC Negative Negative  POCT rapid strep A     Status: Normal   Collection Time: 04/13/23  1:12 AM  Result Value Ref Range   Rapid Strep A Screen Negative Negative      PHQ2/9:    06/15/2023    7:46 AM 04/12/2023    1:52 PM 03/21/2023    8:05 AM 12/19/2022    7:43 AM 10/13/2022    9:34 AM  Depression screen PHQ 2/9  Decreased Interest 0 0 0 0 0  Down, Depressed, Hopeless 0 0 0 0 0  PHQ - 2 Score 0 0 0 0 0  Altered sleeping 0 1 0 0 0  Tired, decreased energy 0 1 0 0 0  Change in appetite 0 1 0 0 0  Feeling bad or failure  about yourself  0 0 0 0 0  Trouble concentrating 0 0 0 0 0  Moving slowly or fidgety/restless 0 0 0 0 0  Suicidal thoughts 0 0 0 0 0  PHQ-9 Score 0 3 0 0 0  Difficult doing work/chores Not difficult at all        phq 9 is negative   Fall Risk:    03/21/2023    8:05 AM 12/19/2022    7:43 AM 09/20/2022    9:44 AM 09/06/2022    7:53 AM 07/05/2022    7:49 AM  Fall Risk   Falls in the past year? 0 0 0 0 0  Number falls in past yr: 0 0 0 0 0  Injury with Fall? 0 0 0 0 0  Risk for fall due to : No Fall Risks No Fall Risks No Fall Risks No Fall Risks No Fall Risks  Follow up Falls prevention discussed Falls prevention discussed Falls prevention discussed;Education provided;Falls evaluation completed Falls prevention discussed Falls prevention discussed    Assessment & Plan  Assessment and Plan    Morbid Obesity Recent weight gain, currently on Wegovy for weight loss. Discussed the importance of consistent diet and physical activity. -Continue Wegovy for weight loss. -Encouraged to maintain a consistent diet and incorporate physical activity.  Major Depression Reports stress and frustration related to the holiday season, but no signs of major depressive disorder based on PHQ-9 score. -Continue current management, monitor for changes in mood or stress levels. She does not want medication   Hypothyroidism Currently on Levothyroxine , last TSH towards the high end of normal. -Continue Levothyroxine daily, check TSH in March.  Prediabetes Last A1c was 5.8, reports increased hunger and frequent urination. -Plan to check A1c in March , she did not want to have labs done today   Gastroesophageal Reflux Disease (GERD) Controlled with Pantoprazole. -Continue Pantoprazole as needed.  Osteoarthritis Reports occasional knee pain, managed with Celebrex as needed. -Continue Celebrex as needed for pain.  Vitamin Deficiencies Reports inconsistent use of prescribed vitamins (Vitamin  D, B12, Folic Acid). -Encouraged to take Levothyroxine separately from other medications. -Continue Vitamin  D, B12, and Folic Acid daily.  Menopausal Symptoms Prescribed Climara, but reports inconsistent use due to side effects. -Continue Climara as tolerated.  Follow-up in March for physical and medication refills.

## 2023-06-15 ENCOUNTER — Encounter: Payer: Self-pay | Admitting: Family Medicine

## 2023-06-15 ENCOUNTER — Ambulatory Visit: Payer: BC Managed Care – PPO | Admitting: Family Medicine

## 2023-06-15 VITALS — BP 124/76 | HR 86 | Temp 97.9°F | Resp 16 | Ht 63.0 in | Wt 271.5 lb

## 2023-06-15 DIAGNOSIS — F325 Major depressive disorder, single episode, in full remission: Secondary | ICD-10-CM | POA: Diagnosis not present

## 2023-06-15 DIAGNOSIS — K219 Gastro-esophageal reflux disease without esophagitis: Secondary | ICD-10-CM

## 2023-06-15 DIAGNOSIS — Z6841 Body Mass Index (BMI) 40.0 and over, adult: Secondary | ICD-10-CM

## 2023-06-15 DIAGNOSIS — Z9884 Bariatric surgery status: Secondary | ICD-10-CM

## 2023-06-15 DIAGNOSIS — G8929 Other chronic pain: Secondary | ICD-10-CM

## 2023-06-15 DIAGNOSIS — E039 Hypothyroidism, unspecified: Secondary | ICD-10-CM

## 2023-06-15 DIAGNOSIS — E559 Vitamin D deficiency, unspecified: Secondary | ICD-10-CM

## 2023-06-15 DIAGNOSIS — M25562 Pain in left knee: Secondary | ICD-10-CM

## 2023-06-15 DIAGNOSIS — E785 Hyperlipidemia, unspecified: Secondary | ICD-10-CM

## 2023-06-15 DIAGNOSIS — M25561 Pain in right knee: Secondary | ICD-10-CM

## 2023-06-15 DIAGNOSIS — E538 Deficiency of other specified B group vitamins: Secondary | ICD-10-CM

## 2023-06-15 MED ORDER — WEGOVY 2.4 MG/0.75ML ~~LOC~~ SOAJ
2.4000 mg | SUBCUTANEOUS | 0 refills | Status: DC
Start: 2023-06-15 — End: 2024-03-11

## 2023-06-15 MED ORDER — FOLIC ACID 1 MG PO TABS
1.0000 mg | ORAL_TABLET | Freq: Every day | ORAL | 1 refills | Status: DC
Start: 2023-06-15 — End: 2024-03-11

## 2023-06-15 MED ORDER — LEVOTHYROXINE SODIUM 88 MCG PO TABS: 88.0000 ug | ORAL_TABLET | Freq: Every day | ORAL | 0 refills | Status: DC

## 2023-06-15 MED ORDER — PANTOPRAZOLE SODIUM 40 MG PO TBEC
40.0000 mg | DELAYED_RELEASE_TABLET | Freq: Every day | ORAL | 1 refills | Status: DC
Start: 2023-06-15 — End: 2024-04-11

## 2023-09-08 ENCOUNTER — Encounter: Payer: Self-pay | Admitting: Family Medicine

## 2023-09-08 ENCOUNTER — Ambulatory Visit (INDEPENDENT_AMBULATORY_CARE_PROVIDER_SITE_OTHER): Payer: BC Managed Care – PPO | Admitting: Family Medicine

## 2023-09-08 VITALS — BP 128/80 | HR 85 | Temp 98.0°F | Resp 16 | Ht 63.0 in | Wt 273.4 lb

## 2023-09-08 DIAGNOSIS — Z0001 Encounter for general adult medical examination with abnormal findings: Secondary | ICD-10-CM

## 2023-09-08 DIAGNOSIS — E538 Deficiency of other specified B group vitamins: Secondary | ICD-10-CM

## 2023-09-08 DIAGNOSIS — E039 Hypothyroidism, unspecified: Secondary | ICD-10-CM

## 2023-09-08 DIAGNOSIS — E559 Vitamin D deficiency, unspecified: Secondary | ICD-10-CM | POA: Diagnosis not present

## 2023-09-08 DIAGNOSIS — Z79899 Other long term (current) drug therapy: Secondary | ICD-10-CM

## 2023-09-08 DIAGNOSIS — Z0000 Encounter for general adult medical examination without abnormal findings: Secondary | ICD-10-CM

## 2023-09-08 DIAGNOSIS — R7303 Prediabetes: Secondary | ICD-10-CM

## 2023-09-08 DIAGNOSIS — E785 Hyperlipidemia, unspecified: Secondary | ICD-10-CM

## 2023-09-08 NOTE — Progress Notes (Signed)
 Name: Analie Katzman   MRN: [id number]    DOB: 05/23/1976   Date:09/08/2023       Progress Note  Subjective  Chief Complaint  Chief Complaint  Patient presents with   Annual Exam    HPI  Patient presents for annual CPE.  Diet: cooks at home, taking Wegovy, using a weight loss program through work  Exercise: discussed 150 minutes per week   Last Eye Exam: completed Last Dental Exam: completed  Flowsheet Row Office Visit from 09/08/2023 in [facility]  AUDIT-C Score 1      Depression: Phq 9 is  negative    09/08/2023    8:09 AM 06/15/2023    7:46 AM 04/12/2023    1:52 PM 03/21/2023    8:05 AM 12/19/2022    7:43 AM  Depression screen PHQ 2/9  Decreased Interest 0 0 0 0 0  Down, Depressed, Hopeless 0 0 0 0 0  PHQ - 2 Score 0 0 0 0 0  Altered sleeping 0 0 1 0 0  Tired, decreased energy 0 0 1 0 0  Change in appetite 0 0 1 0 0  Feeling bad or failure about yourself  0 0 0 0 0  Trouble concentrating 0 0 0 0 0  Moving slowly or fidgety/restless 0 0 0 0 0  Suicidal thoughts 0 0 0 0 0  PHQ-9 Score 0 0 3 0 0  Difficult doing work/chores Not difficult at all Not difficult at all      Hypertension: BP Readings from Last 3 Encounters:  09/08/23 128/80  06/15/23 124/76  04/12/23 96/76   Obesity: Wt Readings from Last 3 Encounters:  09/08/23 273 lb 6.4 oz (124 kg)  06/15/23 271 lb 8 oz (123.2 kg)  04/12/23 263 lb 9.6 oz (119.6 kg)   BMI Readings from Last 3 Encounters:  09/08/23 48.43 kg/m  06/15/23 48.09 kg/m  04/12/23 46.69 kg/m     Vaccines: reviewed with the patient.   Hep C Screening: completed STD testing and prevention (HIV/chl/gon/syphilis): N/A Intimate partner violence: negative screen  Sexual History : no pain during sex, one partner Menstrual History/LMP/Abnormal Bleeding:  supra cervical hysterectomy  - for endometrial cancer  Discussed importance of follow up if any post-menopausal bleeding: yes  Incontinence  Symptoms: positive for symptoms but mild when bladder is very full  Breast cancer:  - Last Mammogram: up todate  - BRCA gene screening: N/A  Osteoporosis Prevention : Discussed high calcium and vitamin D supplementation, weight bearing exercises Bone density :not applicable   Cervical cancer screening: up-to-date  Skin cancer: Discussed monitoring for atypical lesions  Colorectal cancer: up to date , repeat cologuard next year    Lung cancer:  Low Dose CT Chest recommended if Age 77-80 years, 20 pack-year currently smoking OR have quit w/in 15years. Patient does not qualify for screen   ECG: 2017  Advanced Care Planning: A voluntary discussion about advance care planning including the explanation and discussion of advance directives.  Discussed health care proxy and Living will, and the patient was able to identify a health care proxy as  husband .  Patient does not have a living will and power of attorney of health care   Patient Active Problem List   Diagnosis Date Noted   GERD without esophagitis 09/01/2020   Vaginal dysplasia 01/09/2018   LGSIL Pap smear of vagina 01/02/2018   Vaginal high risk HPV DNA test positive 01/02/2018   Hypothyroidism 11/28/2017  Urgency of urination 11/10/2016   Surgical menopause 11/10/2016   Cyst of left breast 08/30/2016   History of malignant neoplasm of endometrium 06/29/2016   HPV in female 06/21/2016   Fatty liver 06/13/2016   Bilateral carpal tunnel syndrome 06/13/2016   Status post bariatric surgery 06/13/2016   Hypertriglyceridemia 05/19/2016   History of fracture of left ankle 05/19/2016   Former cigarette smoker 02/17/2016   Prediabetes 02/17/2016   Status post laparoscopic hysterectomy 11/10/2015   Morbid obesity with BMI of 50.0-59.9, adult (HCC) 11/10/2015   Borderline personality disorder (HCC) 01/13/2015   ADHD (attention deficit hyperactivity disorder), combined type 01/13/2015   Apnea, sleep 01/13/2015   Insomnia, persistent  01/13/2015   Depression, major, recurrent, moderate (HCC) 01/13/2015   Fibromyalgia 01/13/2015   Generalized anxiety disorder 01/13/2015   Classical migraine with intractable migraine 09/12/2014   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    Past Surgical History:  Procedure Laterality Date   ABDOMINAL HYSTERECTOMY     tah.bso   CARPAL TUNNEL RELEASE     pt states did not have surgery just injections    LAPAROSCOPIC GASTRIC SLEEVE RESECTION N/A 06/13/2016   Procedure: LAPAROSCOPIC GASTRIC SLEEVE RESECTION WITH HIATAL HERNIA REPAIR AND UPPER ENDOSCOPY;  Surgeon: Gaynelle Adu, MD;  Location: [facility];  Service: General;  Laterality: N/A;   LEEP     TONSILLECTOMY     VARICOSE VEIN SURGERY      Family History  Problem Relation Age of Onset   Hyperlipidemia Mother    Bipolar disorder Mother    Heart attack Father    Drug abuse Father    Anxiety disorder Sister    Depression Sister    Anxiety disorder Sister    Depression Sister    ADD / ADHD Sister    Alcohol abuse Sister    Thyroid disease Maternal Grandmother    Diabetes Maternal Grandmother    Stroke Maternal Grandmother    Ovarian cancer Neg Hx    Breast cancer Neg Hx    Colon cancer Neg Hx     Social History   Socioeconomic History   Marital status: Married    Spouse name: Not on file   Number of children: 1   Years of education: Not on file   Highest education level: Bachelor's degree (e.g., BA, AB, BS)  Occupational History   Occupation: accountant  Tobacco Use   Smoking status: Former    Current packs/day: 0.00    Average packs/day: 1 pack/day for 23.0 years (23.0 ttl pk-yrs)    Types: Cigarettes    Start date: 02/05/1994    Quit date: 05/10/2016    Years since quitting: 7.3   Smokeless tobacco: Never  Vaping Use   Vaping status: Never Used  Substance and Sexual Activity   Alcohol use: Yes    Alcohol/week: 0.0 standard drinks of alcohol    Comment: occas   Drug use: No   Sexual activity: Yes    Birth  control/protection: Surgical  Other Topics Concern   Not on file  Social History Narrative   Married,    Has a grown son, raising step-daughter Kara Mead ) , also fostering/raising a 47 yo - Emma's half sister and now Emma's half brother    Going to school, trying to finish her bachelor in business administration    Social Drivers of Health   Financial Resource Strain: Medium Risk (09/08/2023)   Overall Financial Resource Strain (CARDIA)    Difficulty of Paying Living Expenses: Somewhat hard  Food Insecurity: No Food Insecurity (09/08/2023)   Hunger Vital Sign    Worried About Running Out of Food in the Last Year: Never true    Ran Out of Food in the Last Year: Never true  Recent Concern: Food Insecurity - Food Insecurity Present (06/15/2023)   Hunger Vital Sign    Worried About Running Out of Food in the Last Year: Sometimes true    Ran Out of Food in the Last Year: Sometimes true  Transportation Needs: No Transportation Needs (09/08/2023)   PRAPARE - Administrator, Civil Service (Medical): No    Lack of Transportation (Non-Medical): No  Physical Activity: Insufficiently Active (09/08/2023)   Exercise Vital Sign    Days of Exercise per Week: 2 days    Minutes of Exercise per Session: 20 min  Stress: No Stress Concern Present (09/08/2023)   [employer] of Occupational Health - Occupational Stress Questionnaire    Feeling of Stress : Only a little  Social Connections: Socially Integrated (09/08/2023)   Social Connection and Isolation Panel [NHANES]    Frequency of Communication with Friends and Family: More than three times a week    Frequency of Social Gatherings with Friends and Family: Twice a week    Attends Religious Services: More than 4 times per year    Active Member of [employer] or Organizations: Yes    Attends Engineer, structural: More than 4 times per year    Marital Status: Married  Catering manager Violence: Not At Risk (09/08/2023)   Humiliation, Afraid,  Rape, and Kick questionnaire    Fear of Current or Ex-Partner: No    Emotionally Abused: No    Physically Abused: No    Sexually Abused: No     Current Outpatient Medications:    celecoxib (CELEBREX) 100 MG capsule, Take 1 capsule (100 mg total) by mouth 2 (two) times daily., Disp: 180 capsule, Rfl: 0   Cholecalciferol (VITAMIN D) 50 MCG (2000 UT) CAPS, Take 1 capsule (2,000 Units total) by mouth daily., Disp: 100 capsule, Rfl: 1   Cyanocobalamin (B-12) 1000 MCG SUBL, Place 1 tablet under the tongue daily., Disp: 100 tablet, Rfl: 1   estradiol (CLIMARA - DOSED IN MG/24 HR) 0.05 mg/24hr patch, Place 1 patch (0.05 mg total) onto the skin once a week., Disp: 13 patch, Rfl: 3   folic acid (FOLVITE) 1 MG tablet, Take 1 tablet (1 mg total) by mouth daily., Disp: 100 tablet, Rfl: 1   levothyroxine (SYNTHROID) 88 MCG tablet, Take 1 tablet (88 mcg total) by mouth daily before breakfast., Disp: 102 tablet, Rfl: 0   pantoprazole (PROTONIX) 40 MG tablet, Take 1 tablet (40 mg total) by mouth daily., Disp: 90 tablet, Rfl: 1   Semaglutide-Weight Management (WEGOVY) 2.4 MG/0.75ML SOAJ, Inject 2.4 mg into the skin once a week., Disp: 9 mL, Rfl: 0  Allergies  Allergen Reactions   Ciprofloxacin Hives   Tramadol Rash     ROS  Constitutional: Negative for fever or weight change.  Respiratory: Negative for cough and shortness of breath.   Cardiovascular: Negative for chest pain or palpitations.  Gastrointestinal: Negative for abdominal pain, no bowel changes.  Musculoskeletal: Negative for gait problem or joint swelling.  Skin: Negative for rash.  Neurological: Negative for dizziness or headache.  No other specific complaints in a complete review of systems (except as listed in HPI above).   Objective  Vitals:   09/08/23 0810  BP: 128/80  Pulse: 85  Resp: 16  Temp: 98 F (36.7 C)  TempSrc: Oral  SpO2: 96%  Weight: 273 lb 6.4 oz (124 kg)  Height: 5\' 3"  (1.6 m)    Body mass index is 48.43  kg/m.  Physical Exam  Constitutional: Patient appears well-developed and well-nourished. No distress.  HENT: Head: Normocephalic and atraumatic. Ears: B TMs ok, no erythema or effusion; Nose: Nose normal. Mouth/Throat: Oropharynx is clear and moist. No oropharyngeal exudate.  Eyes: Conjunctivae and EOM are normal. Pupils are equal, round, and reactive to light. No scleral icterus.  Neck: Normal range of motion. Neck supple. No JVD present. No thyromegaly present.  Cardiovascular: Normal rate, regular rhythm and normal heart sounds.  No murmur heard. No BLE edema. Pulmonary/Chest: Effort normal and breath sounds normal. No respiratory distress. Abdominal: Soft. Bowel sounds are normal, no distension. There is no tenderness. no masses Breast: no lumps or masses, no nipple discharge or rashes FEMALE GENITALIA:  Not done  RECTAL: not done  Musculoskeletal: Normal range of motion, no joint effusions. No gross deformities Neurological: he is alert and oriented to person, place, and time. No cranial nerve deficit. Coordination, balance, strength, speech and gait are normal.  Skin: Skin is warm and dry. No rash noted. No erythema.  Psychiatric: Patient has a normal mood and affect. behavior is normal. Judgment and thought content normal.     Assessment & Plan  1. Well adult exam (Primary)  - Lipid panel - CBC with Differential/Platelet - COMPLETE METABOLIC PANEL WITH GFR - B12 and Folate Panel - VITAMIN D 25 Hydroxy (Vit-D Deficiency, Fractures) - TSH - Hemoglobin A1c  2. B12 deficiency  - B12 and Folate Panel  3. Hypothyroidism (acquired)  - TSH  4. Folic acid deficiency  - B12 and Folate Panel  5. Vitamin D deficiency  - VITAMIN D 25 Hydroxy (Vit-D Deficiency, Fractures)  6. Long-term use of high-risk medication  - CBC with Differential/Platelet - COMPLETE METABOLIC PANEL WITH GFR  7. Dyslipidemia  - Lipid panel  8. Prediabetes  - Hemoglobin A1c   -USPSTF  grade A and B recommendations reviewed with patient; age-appropriate recommendations, preventive care, screening tests, etc discussed and encouraged; healthy living encouraged; see AVS for patient education given to patient -Discussed importance of 150 minutes of physical activity weekly, eat two servings of fish weekly, eat one serving of tree nuts ( cashews, pistachios, pecans, almonds.Marland Kitchen) every other day, eat 6 servings of fruit/vegetables daily and drink plenty of water and avoid sweet beverages.   -Reviewed Health Maintenance: Yes.

## 2023-09-09 LAB — CBC WITH DIFFERENTIAL/PLATELET
Absolute Lymphocytes: 2054 {cells}/uL (ref 850–3900)
Absolute Monocytes: 416 {cells}/uL (ref 200–950)
Basophils Absolute: 33 {cells}/uL (ref 0–200)
Basophils Relative: 0.5 %
Eosinophils Absolute: 130 {cells}/uL (ref 15–500)
Eosinophils Relative: 2 %
HCT: 40.5 % (ref 35.0–45.0)
Hemoglobin: 13.3 g/dL (ref 11.7–15.5)
MCH: 28.9 pg (ref 27.0–33.0)
MCHC: 32.8 g/dL (ref 32.0–36.0)
MCV: 88 fL (ref 80.0–100.0)
MPV: 10.2 fL (ref 7.5–12.5)
Monocytes Relative: 6.4 %
Neutro Abs: 3868 {cells}/uL (ref 1500–7800)
Neutrophils Relative %: 59.5 %
Platelets: 280 10*3/uL (ref 140–400)
RBC: 4.6 10*6/uL (ref 3.80–5.10)
RDW: 13.4 % (ref 11.0–15.0)
Total Lymphocyte: 31.6 %
WBC: 6.5 10*3/uL (ref 3.8–10.8)

## 2023-09-09 LAB — COMPLETE METABOLIC PANEL WITH GFR
AG Ratio: 1.5 (calc) (ref 1.0–2.5)
ALT: 13 U/L (ref 6–29)
AST: 14 U/L (ref 10–35)
Albumin: 4.5 g/dL (ref 3.6–5.1)
Alkaline phosphatase (APISO): 66 U/L (ref 31–125)
BUN: 17 mg/dL (ref 7–25)
CO2: 28 mmol/L (ref 20–32)
Calcium: 9.5 mg/dL (ref 8.6–10.2)
Chloride: 103 mmol/L (ref 98–110)
Creat: 0.79 mg/dL (ref 0.50–0.99)
Globulin: 3 g/dL (ref 1.9–3.7)
Glucose, Bld: 83 mg/dL (ref 65–99)
Potassium: 4.4 mmol/L (ref 3.5–5.3)
Sodium: 140 mmol/L (ref 135–146)
Total Bilirubin: 0.4 mg/dL (ref 0.2–1.2)
Total Protein: 7.5 g/dL (ref 6.1–8.1)
eGFR: 93 mL/min/{1.73_m2} (ref >=60)

## 2023-09-09 LAB — HEMOGLOBIN A1C
Hgb A1c MFr Bld: 5.8 %{Hb} — ABNORMAL HIGH (ref <5.7)
Mean Plasma Glucose: 120 mg/dL
eAG (mmol/L): 6.6 mmol/L

## 2023-09-09 LAB — LIPID PANEL
Cholesterol: 207 mg/dL — ABNORMAL HIGH (ref <200)
HDL: 84 mg/dL (ref >=50)
LDL Cholesterol (Calc): 107 mg/dL — ABNORMAL HIGH
Non-HDL Cholesterol (Calc): 123 mg/dL (ref <130)
Total CHOL/HDL Ratio: 2.5 (calc) (ref <5.0)
Triglycerides: 69 mg/dL (ref <150)

## 2023-09-09 LAB — B12 AND FOLATE PANEL
Folate: 9.9 ng/mL
Vitamin B-12: 538 pg/mL (ref 200–1100)

## 2023-09-09 LAB — VITAMIN D 25 HYDROXY (VIT D DEFICIENCY, FRACTURES): Vit D, 25-Hydroxy: 37 ng/mL (ref 30–100)

## 2023-09-09 LAB — TSH: TSH: 14.95 m[IU]/L — ABNORMAL HIGH

## 2023-09-11 ENCOUNTER — Encounter: Payer: Self-pay | Admitting: Family Medicine

## 2023-10-03 ENCOUNTER — Telehealth: Payer: Self-pay | Admitting: Family Medicine

## 2023-10-03 NOTE — Telephone Encounter (Signed)
 Prior Auth from [employer] [facility]) 0.5   Key: [id number]

## 2023-10-03 NOTE — Telephone Encounter (Signed)
 PA not done due to patient not on this dose. Pt is now on 2.4

## 2023-12-08 ENCOUNTER — Ambulatory Visit: Payer: Self-pay | Admitting: Family Medicine

## 2023-12-11 ENCOUNTER — Ambulatory Visit: Admitting: Family Medicine

## 2024-01-13 ENCOUNTER — Other Ambulatory Visit: Payer: Self-pay | Admitting: Family Medicine

## 2024-01-13 DIAGNOSIS — E039 Hypothyroidism, unspecified: Secondary | ICD-10-CM

## 2024-01-15 ENCOUNTER — Other Ambulatory Visit: Payer: Self-pay | Admitting: Family Medicine

## 2024-01-15 ENCOUNTER — Telehealth: Payer: Self-pay

## 2024-01-15 DIAGNOSIS — E538 Deficiency of other specified B group vitamins: Secondary | ICD-10-CM

## 2024-01-15 MED ORDER — B-12 1000 MCG SL SUBL: 1.0000 | SUBLINGUAL_TABLET | Freq: Every day | SUBLINGUAL | 1 refills | Status: AC

## 2024-01-15 NOTE — Addendum Note (Signed)
 Addended by: RENTERIA-GARCIA, Tanicia Wolaver on: 01/15/2024 04:03 PM   Modules accepted: Orders

## 2024-01-15 NOTE — Telephone Encounter (Signed)
 Refill request on   Cyanocobalamin  (B-12) 1000 MCG SUBL

## 2024-01-15 NOTE — Telephone Encounter (Signed)
[facility] to [facility] 

## 2024-01-15 NOTE — Telephone Encounter (Signed)
 Pt had labs in March 2025

## 2024-01-23 ENCOUNTER — Other Ambulatory Visit: Payer: Self-pay | Admitting: Family Medicine

## 2024-01-23 DIAGNOSIS — Z1231 Encounter for screening mammogram for malignant neoplasm of breast: Secondary | ICD-10-CM

## 2024-02-13 ENCOUNTER — Ambulatory Visit
Admission: RE | Admit: 2024-02-13 | Discharge: 2024-02-13 | Disposition: A | Discharge status: Home / Self Care | Source: Ambulatory Visit | Attending: Family Medicine | Admitting: Family Medicine

## 2024-02-13 DIAGNOSIS — Z1231 Encounter for screening mammogram for malignant neoplasm of breast: Secondary | ICD-10-CM | POA: Insufficient documentation

## 2024-03-11 ENCOUNTER — Encounter: Payer: Self-pay | Admitting: Family Medicine

## 2024-03-11 ENCOUNTER — Ambulatory Visit: Payer: Self-pay | Admitting: Family Medicine

## 2024-03-11 VITALS — BP 122/82 | HR 82 | Resp 16 | Ht 63.0 in | Wt 291.6 lb

## 2024-03-11 DIAGNOSIS — Z23 Encounter for immunization: Secondary | ICD-10-CM | POA: Diagnosis not present

## 2024-03-11 DIAGNOSIS — E785 Hyperlipidemia, unspecified: Secondary | ICD-10-CM

## 2024-03-11 DIAGNOSIS — E039 Hypothyroidism, unspecified: Secondary | ICD-10-CM | POA: Diagnosis not present

## 2024-03-11 DIAGNOSIS — F325 Major depressive disorder, single episode, in full remission: Secondary | ICD-10-CM | POA: Diagnosis not present

## 2024-03-11 DIAGNOSIS — R7303 Prediabetes: Secondary | ICD-10-CM

## 2024-03-11 DIAGNOSIS — N951 Menopausal and female climacteric states: Secondary | ICD-10-CM

## 2024-03-11 DIAGNOSIS — M25561 Pain in right knee: Secondary | ICD-10-CM

## 2024-03-11 DIAGNOSIS — Z6841 Body Mass Index (BMI) 40.0 and over, adult: Secondary | ICD-10-CM

## 2024-03-11 DIAGNOSIS — Z9884 Bariatric surgery status: Secondary | ICD-10-CM

## 2024-03-11 DIAGNOSIS — K219 Gastro-esophageal reflux disease without esophagitis: Secondary | ICD-10-CM

## 2024-03-11 DIAGNOSIS — E538 Deficiency of other specified B group vitamins: Secondary | ICD-10-CM

## 2024-03-11 DIAGNOSIS — G8929 Other chronic pain: Secondary | ICD-10-CM

## 2024-03-11 DIAGNOSIS — Z1159 Encounter for screening for other viral diseases: Secondary | ICD-10-CM

## 2024-03-11 MED ORDER — FOLIC ACID 1 MG PO TABS: 1.0000 mg | ORAL_TABLET | Freq: Every day | ORAL | 1 refills | Status: AC

## 2024-03-11 MED ORDER — CELECOXIB 100 MG PO CAPS
100.0000 mg | ORAL_CAPSULE | Freq: Two times a day (BID) | ORAL | 0 refills | Status: AC
Start: 2024-03-11 — End: ?

## 2024-03-11 MED ORDER — ESTRADIOL 0.5 MG PO TABS
0.5000 mg | ORAL_TABLET | Freq: Every day | ORAL | 1 refills | Status: AC
Start: 2024-03-11 — End: ?

## 2024-03-11 MED ORDER — LEVOTHYROXINE SODIUM 88 MCG PO TABS: 88.0000 ug | ORAL_TABLET | Freq: Every day | ORAL | 0 refills | Status: AC

## 2024-03-11 MED ORDER — ESTRADIOL 0.5 MG PO TABS: 0.5000 mg | ORAL_TABLET | Freq: Every day | ORAL | 1 refills | Status: DC

## 2024-03-11 NOTE — Progress Notes (Signed)
 Name: Dana Whitaker   MRN: [id number]    DOB: 1975-08-14   Date:03/11/2024       Progress Note  Subjective  Chief Complaint  Chief Complaint  Patient presents with   Medical Management of Chronic Issues   Discussed the use of AI scribe software for clinical note transcription with the patient, who gave verbal consent to proceed.  History of Present Illness Dana Whitaker is a 48 year old female who presents for follow-up on weight management and medication adherence.  She has been managing her weight with GLP-1 agonists, initially using Wegovy  and switching to Zepbound two months ago. She started on a 12.5 mg dose and recently increased to 15 mg. Despite these medications, she reports weight gain, with her current weight at 291 lbs and a BMI of 51.65. She has been trying to eat healthily, cooking more at home, and avoiding high-calorie foods like ice cream and sweets. She has a history of bariatric surgery, specifically a sleeve gastrectomy, which she feels was not effective. She has tried various diets and medications over the past 20 years without success.  She has a history of hypothyroidism and is prescribed levothyroxine  88 mcg. However, she only remembers to take the medication 3-4 times a week instead of daily. She experiences hair loss but not dry skin.   She has prediabetes with an A1c that has ranged between 5.8 and 6.0. She experiences increased hunger, thirst, and frequent urination. She has been on GLP-1 agonists for two years, which are also used for diabetes management.  She underwent a total hysterectomy and was on hormone replacement therapy post-surgery but discontinued due to adverse effects, including feeling 'weird' and excessively sleepy. She has not resumed hormone therapy and is not currently using the [place]  patch.  She has a history of major depression and currently feels emotionally stable without medication.   Her sleep is disrupted, often  taking a long time to fall asleep and waking up during the night. She tries to maintain a consistent sleep schedule but struggles with sleep hygiene. She does not consume caffeine in the afternoon and rarely drinks alcohol.  She takes pantoprazole  as needed for stomach issues, ensuring it is not taken with her thyroid  medication. She also takes B12 and folic acid  at night to avoid interference with her thyroid  medication.  She experiences joint pain, particularly in her knees, and takes Celebrex  for relief. She also uses paper plates to simplify meal cleanup due to fatigue from her daily routine.    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/2016   Fatty liver 06/13/2016   Bilateral carpal tunnel syndrome 06/13/2016   Status post bariatric surgery 06/13/2016   Hypertriglyceridemia 05/19/2016   History of fracture of left ankle 05/19/2016   Former cigarette smoker 02/17/2016   Prediabetes 02/17/2016   Status post laparoscopic hysterectomy 11/10/2015   Morbid obesity with BMI of 50.0-59.9, adult (HCC) 11/10/2015   Borderline personality disorder (HCC) 01/13/2015   ADHD (attention deficit hyperactivity disorder), combined type 01/13/2015   Apnea, sleep 01/13/2015   Insomnia, persistent 01/13/2015  Depression, major, recurrent, moderate (HCC) 01/13/2015   Fibromyalgia 01/13/2015   Generalized anxiety disorder 01/13/2015   Classical migraine with intractable migraine 09/12/2014   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    Past Surgical History:  Procedure Laterality Date   ABDOMINAL HYSTERECTOMY     [place].[email or web address]    CARPAL TUNNEL RELEASE     pt states did not have surgery just injections    LAPAROSCOPIC GASTRIC SLEEVE RESECTION N/A 06/13/2016   Procedure: LAPAROSCOPIC GASTRIC SLEEVE RESECTION WITH HIATAL HERNIA REPAIR AND UPPER ENDOSCOPY;  Surgeon: Camellia Blush, MD;  Location: [facility];  Service: General;  Laterality: N/A;   LEEP     TONSILLECTOMY     VARICOSE VEIN SURGERY      Family History  Problem Relation Age of Onset   Hyperlipidemia Mother    Bipolar disorder Mother    Heart attack Father    Drug abuse Father    Anxiety disorder Sister    Depression Sister    Anxiety disorder Sister    Depression Sister    ADD / ADHD Sister    Alcohol abuse Sister    Thyroid  disease Maternal Grandmother    Diabetes Maternal Grandmother    Stroke Maternal Grandmother    Ovarian cancer Neg Hx    Breast cancer Neg Hx    Colon cancer Neg Hx     Social History   Tobacco Use   Smoking status: Former    Current packs/day: 0.00    Average packs/day: 1 pack/day for 23.0 years (23.0 ttl pk-yrs)    Types: Cigarettes    Start date: 02/05/1994    Quit date: 05/10/2016    Years since quitting: 7.8   Smokeless tobacco: Never  Substance Use Topics   Alcohol use: Yes    Alcohol/week: 0.0 standard drinks of alcohol    Comment: occas     Current Outpatient Medications:    celecoxib  (CELEBREX ) 100 MG capsule, Take 1 capsule (100 mg total) by mouth 2 (two) times daily., Disp: 180 capsule, Rfl: 0   Cholecalciferol (VITAMIN D ) 50 MCG (2000 UT) CAPS, Take 1 capsule (2,000 Units total) by mouth daily., Disp: 100 capsule, Rfl: 1   Cyanocobalamin  (B-12) 1000 MCG SUBL, Place 1 tablet under the tongue daily., Disp: 100 tablet, Rfl: 1   folic acid  (FOLVITE ) 1 MG tablet, Take 1 tablet (1 mg total) by mouth daily., Disp: 100 tablet, Rfl: 1   levothyroxine  (SYNTHROID ) 88 MCG tablet, Take 1 tablet (88 mcg total) by mouth daily before breakfast., Disp: 102 tablet, Rfl: 0   pantoprazole  (PROTONIX ) 40 MG tablet, Take 1 tablet  (40 mg total) by mouth daily., Disp: 90 tablet, Rfl: 1   tirzepatide (ZEPBOUND) 15 MG/0.5ML Pen, Inject 15 mg into the skin once a week., Disp: , Rfl:   Allergies  Allergen Reactions   Ciprofloxacin Hives   Tramadol  Rash    I personally reviewed active problem list, medication list, allergies, family history with the patient/caregiver today.   ROS  Ten systems reviewed and is negative except as mentioned in HPI    Objective Physical Exam  CONSTITUTIONAL: Patient appears well-developed and well-nourished.  No distress. HEENT: Head atraumatic, normocephalic, neck supple. CARDIOVASCULAR: Normal rate, regular rhythm and normal heart sounds.  No murmur heard. No BLE edema. PULMONARY: Effort normal and breath sounds normal. No respiratory distress.. MUSCULOSKELETAL: Normal gait. Without gross motor or sensory deficit. PSYCHIATRIC: Patient has a normal mood and affect. behavior is normal. Judgment and thought content normal.  Vitals:  03/11/24 0830  BP: 122/82  Pulse: 82  Resp: 16  SpO2: 96%  Weight: 291 lb 9.6 oz (132.3 kg)  Height: 5' 3 (1.6 m)    Body mass index is 51.65 kg/m.    PHQ2/9:    03/11/2024    8:29 AM 09/08/2023    8:09 AM 06/15/2023    7:46 AM 04/12/2023    1:52 PM 03/21/2023    8:05 AM  Depression screen PHQ 2/9  Decreased Interest 0 0 0 0 0  Down, Depressed, Hopeless 0 0 0 0 0  PHQ - 2 Score 0 0 0 0 0  Altered sleeping 0 0 0 1 0  Tired, decreased energy 0 0 0 1 0  Change in appetite 0 0 0 1 0  Feeling bad or failure about yourself  0 0 0 0 0  Trouble concentrating 0 0 0 0 0  Moving slowly or fidgety/restless 0 0 0 0 0  Suicidal thoughts 0 0 0 0 0  PHQ-9 Score 0 0 0 3 0  Difficult doing work/chores Not difficult at all Not difficult at all Not difficult at all      phq 9 is negative  Fall Risk:    03/11/2024    8:26 AM 03/21/2023    8:05 AM 12/19/2022    7:43 AM 09/20/2022    9:44 AM 09/06/2022    7:53 AM  Fall Risk   Falls in the past year? 0 0  0 0 0  Number falls in past yr: 0 0 0 0 0  Injury with Fall? 0 0 0 0 0  Risk for fall due to : No Fall Risks No Fall Risks No Fall Risks No Fall Risks No Fall Risks  Follow up Falls evaluation completed Falls prevention discussed Falls prevention discussed Falls prevention discussed;Education provided;Falls evaluation completed Falls prevention discussed     Assessment & Plan Morbid obesity status post bariatric surgery Morbid obesity with BMI 51.65, post sleeve gastrectomy. Weight gain persists despite GLP-1 agonists. Considering conversion to gastric bypass. - Continue Zepbound 15 mg. - Discuss potential conversion to gastric bypass surgery.  Hypothyroidism adult  Hypothyroidism with inconsistent levothyroxine  adherence. Symptoms include hair loss. TSH levels need assessment. - Order TSH test. - Encourage use of a pill box for consistent levothyroxine  intake. - Refill levothyroxine  prescription.  Prediabetes Prediabetes with A1c previously 5.8-6.0. Symptoms of increased hunger, thirst, and urination. Unclear if diabetes has developed. - Order A1c test.  Menopausal state after hysterectomy Postmenopausal state after hysterectomy. Previous hormone therapy discontinued due to adverse effects. Considering estradiol  reintroduction. - Prescribe estradiol  0.5 mg. - Advise taking estradiol  with levothyroxine  in the morning.  Depression in remission Major depression in remission. No emotional distress reported.  Right knee pain Chronic right knee pain managed with Celebrex . - Continue Celebrex  as needed.  Vitamin B deficiency Vitamin B deficiency managed with B12 and folic acid . - Continue B12 and folic acid  supplementation. - Refill B12 and folic acid  prescriptions.

## 2024-03-12 LAB — HEMOGLOBIN A1C
Hgb A1c MFr Bld: 5.7 % — ABNORMAL HIGH (ref <5.7)
Mean Plasma Glucose: 117 mg/dL
eAG (mmol/L): 6.5 mmol/L

## 2024-03-12 LAB — B12 AND FOLATE PANEL
Folate: 7.4 ng/mL
Vitamin B-12: 364 pg/mL (ref 200–1100)

## 2024-03-12 LAB — TSH: TSH: 6.67 m[IU]/L — ABNORMAL HIGH

## 2024-03-12 LAB — HEPATITIS B SURFACE ANTIBODY,QUALITATIVE: Hep B S Ab: NONREACTIVE

## 2024-03-13 ENCOUNTER — Ambulatory Visit: Payer: Self-pay | Admitting: Family Medicine

## 2024-04-11 ENCOUNTER — Other Ambulatory Visit: Payer: Self-pay | Admitting: Family Medicine

## 2024-04-11 DIAGNOSIS — K219 Gastro-esophageal reflux disease without esophagitis: Secondary | ICD-10-CM

## 2024-06-11 ENCOUNTER — Ambulatory Visit: Payer: Self-pay | Admitting: Family Medicine

## 2024-09-09 ENCOUNTER — Encounter: Admitting: Family Medicine

## 2024-12-10 ENCOUNTER — Ambulatory Visit: Admitting: Family Medicine

## 2025-03-12 ENCOUNTER — Ambulatory Visit: Admitting: Family Medicine
